# Patient Record
Sex: Female | Born: 1975 | Race: Black or African American | Hispanic: No | Marital: Married | Smoking: Former smoker
Health system: Southern US, Community
[De-identification: ages and names within clinical notes are randomized; demographics above are authoritative.]

## PROBLEM LIST (undated history)

## (undated) ENCOUNTER — Inpatient Hospital Stay (HOSPITAL_COMMUNITY): Payer: Self-pay

## (undated) DIAGNOSIS — C801 Malignant (primary) neoplasm, unspecified: Secondary | ICD-10-CM

## (undated) DIAGNOSIS — R112 Nausea with vomiting, unspecified: Secondary | ICD-10-CM

## (undated) DIAGNOSIS — N809 Endometriosis, unspecified: Secondary | ICD-10-CM

## (undated) DIAGNOSIS — F419 Anxiety disorder, unspecified: Secondary | ICD-10-CM

## (undated) DIAGNOSIS — N96 Recurrent pregnancy loss: Secondary | ICD-10-CM

## (undated) DIAGNOSIS — J45909 Unspecified asthma, uncomplicated: Secondary | ICD-10-CM

## (undated) DIAGNOSIS — Z9889 Other specified postprocedural states: Secondary | ICD-10-CM

## (undated) DIAGNOSIS — E039 Hypothyroidism, unspecified: Secondary | ICD-10-CM

## (undated) DIAGNOSIS — K219 Gastro-esophageal reflux disease without esophagitis: Secondary | ICD-10-CM

## (undated) DIAGNOSIS — R42 Dizziness and giddiness: Secondary | ICD-10-CM

## (undated) DIAGNOSIS — D219 Benign neoplasm of connective and other soft tissue, unspecified: Secondary | ICD-10-CM

## (undated) DIAGNOSIS — R51 Headache: Secondary | ICD-10-CM

## (undated) DIAGNOSIS — M1990 Unspecified osteoarthritis, unspecified site: Secondary | ICD-10-CM

## (undated) DIAGNOSIS — G4700 Insomnia, unspecified: Secondary | ICD-10-CM

## (undated) DIAGNOSIS — E559 Vitamin D deficiency, unspecified: Secondary | ICD-10-CM

## (undated) DIAGNOSIS — R002 Palpitations: Secondary | ICD-10-CM

## (undated) DIAGNOSIS — R569 Unspecified convulsions: Secondary | ICD-10-CM

## (undated) DIAGNOSIS — E282 Polycystic ovarian syndrome: Secondary | ICD-10-CM

## (undated) HISTORY — PX: DILATION AND CURETTAGE OF UTERUS: SHX78

## (undated) HISTORY — PX: REDUCTION MAMMAPLASTY: SUR839

## (undated) HISTORY — DX: Insomnia, unspecified: G47.00

## (undated) HISTORY — DX: Vitamin D deficiency, unspecified: E55.9

## (undated) HISTORY — DX: Unspecified asthma, uncomplicated: J45.909

## (undated) HISTORY — DX: Malignant (primary) neoplasm, unspecified: C80.1

## (undated) HISTORY — DX: Recurrent pregnancy loss: N96

## (undated) HISTORY — DX: Polycystic ovarian syndrome: E28.2

## (undated) HISTORY — PX: BREAST REDUCTION SURGERY: SHX8

## (undated) HISTORY — PX: UPPER GI ENDOSCOPY: SHX6162

## (undated) HISTORY — DX: Dizziness and giddiness: R42

## (undated) HISTORY — PX: COLONOSCOPY: SHX174

## (undated) HISTORY — PX: OTHER SURGICAL HISTORY: SHX169

---
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
## 1997-12-12 HISTORY — PX: GANGLION CYST EXCISION: SHX1691

## 1998-04-28 ENCOUNTER — Other Ambulatory Visit: Admission: RE | Admit: 1998-04-28 | Discharge: 1998-04-28 | Payer: Self-pay | Admitting: Gynecology

## 1998-06-08 ENCOUNTER — Other Ambulatory Visit: Admission: RE | Admit: 1998-06-08 | Discharge: 1998-06-08 | Payer: Self-pay | Admitting: Obstetrics and Gynecology

## 1998-06-29 ENCOUNTER — Other Ambulatory Visit: Admission: RE | Admit: 1998-06-29 | Discharge: 1998-06-29 | Payer: Self-pay | Admitting: Obstetrics and Gynecology

## 1998-09-21 ENCOUNTER — Inpatient Hospital Stay (HOSPITAL_COMMUNITY): Admission: AD | Admit: 1998-09-21 | Discharge: 1998-09-21 | Payer: Self-pay | Admitting: Obstetrics & Gynecology

## 1998-11-09 ENCOUNTER — Inpatient Hospital Stay (HOSPITAL_COMMUNITY): Admission: AD | Admit: 1998-11-09 | Discharge: 1998-11-09 | Payer: Self-pay | Admitting: Obstetrics & Gynecology

## 1999-03-12 ENCOUNTER — Inpatient Hospital Stay (HOSPITAL_COMMUNITY): Admission: AD | Admit: 1999-03-12 | Discharge: 1999-03-15 | Payer: Self-pay | Admitting: Obstetrics & Gynecology

## 1999-04-07 ENCOUNTER — Other Ambulatory Visit: Admission: RE | Admit: 1999-04-07 | Discharge: 1999-04-07 | Payer: Self-pay | Admitting: Obstetrics and Gynecology

## 1999-07-01 ENCOUNTER — Emergency Department (HOSPITAL_COMMUNITY): Admission: EM | Admit: 1999-07-01 | Discharge: 1999-07-01 | Payer: Self-pay | Admitting: Emergency Medicine

## 1999-07-01 ENCOUNTER — Encounter: Payer: Self-pay | Admitting: Emergency Medicine

## 2000-02-17 ENCOUNTER — Other Ambulatory Visit: Admission: RE | Admit: 2000-02-17 | Discharge: 2000-02-17 | Payer: Self-pay | Admitting: Obstetrics

## 2000-03-27 ENCOUNTER — Other Ambulatory Visit: Admission: RE | Admit: 2000-03-27 | Discharge: 2000-03-27 | Payer: Self-pay | Admitting: Orthopedic Surgery

## 2000-07-25 ENCOUNTER — Encounter: Payer: Self-pay | Admitting: Emergency Medicine

## 2000-07-25 ENCOUNTER — Emergency Department (HOSPITAL_COMMUNITY): Admission: EM | Admit: 2000-07-25 | Discharge: 2000-07-25 | Payer: Self-pay | Admitting: Emergency Medicine

## 2000-11-24 ENCOUNTER — Emergency Department (HOSPITAL_COMMUNITY): Admission: EM | Admit: 2000-11-24 | Discharge: 2000-11-24 | Payer: Self-pay | Admitting: Emergency Medicine

## 2000-11-24 ENCOUNTER — Encounter: Payer: Self-pay | Admitting: Emergency Medicine

## 2001-04-18 ENCOUNTER — Emergency Department (HOSPITAL_COMMUNITY): Admission: EM | Admit: 2001-04-18 | Discharge: 2001-04-18 | Payer: Self-pay | Admitting: *Deleted

## 2002-02-25 ENCOUNTER — Inpatient Hospital Stay (HOSPITAL_COMMUNITY): Admission: AD | Admit: 2002-02-25 | Discharge: 2002-02-25 | Payer: Self-pay | Admitting: *Deleted

## 2002-03-24 ENCOUNTER — Emergency Department (HOSPITAL_COMMUNITY): Admission: EM | Admit: 2002-03-24 | Discharge: 2002-03-25 | Payer: Self-pay | Admitting: Emergency Medicine

## 2002-06-04 ENCOUNTER — Other Ambulatory Visit: Admission: RE | Admit: 2002-06-04 | Discharge: 2002-06-04 | Payer: Self-pay | Admitting: Obstetrics and Gynecology

## 2003-03-10 ENCOUNTER — Emergency Department (HOSPITAL_COMMUNITY): Admission: EM | Admit: 2003-03-10 | Discharge: 2003-03-10 | Payer: Self-pay | Admitting: Emergency Medicine

## 2003-05-29 ENCOUNTER — Emergency Department (HOSPITAL_COMMUNITY): Admission: EM | Admit: 2003-05-29 | Discharge: 2003-05-30 | Payer: Self-pay | Admitting: Emergency Medicine

## 2004-10-30 ENCOUNTER — Emergency Department (HOSPITAL_COMMUNITY): Admission: EM | Admit: 2004-10-30 | Discharge: 2004-10-30 | Payer: Self-pay | Admitting: Emergency Medicine

## 2006-01-24 ENCOUNTER — Emergency Department (HOSPITAL_COMMUNITY): Admission: EM | Admit: 2006-01-24 | Discharge: 2006-01-25 | Payer: Self-pay | Admitting: Emergency Medicine

## 2006-07-15 ENCOUNTER — Emergency Department (HOSPITAL_COMMUNITY): Admission: EM | Admit: 2006-07-15 | Discharge: 2006-07-16 | Payer: Self-pay | Admitting: Emergency Medicine

## 2006-11-16 ENCOUNTER — Emergency Department (HOSPITAL_COMMUNITY): Admission: EM | Admit: 2006-11-16 | Discharge: 2006-11-17 | Payer: Self-pay | Admitting: Emergency Medicine

## 2008-11-10 ENCOUNTER — Encounter: Payer: Self-pay | Admitting: Internal Medicine

## 2009-02-04 ENCOUNTER — Emergency Department (HOSPITAL_COMMUNITY): Admission: EM | Admit: 2009-02-04 | Discharge: 2009-02-04 | Payer: Self-pay | Admitting: Family Medicine

## 2009-02-08 ENCOUNTER — Ambulatory Visit: Payer: Self-pay | Admitting: Diagnostic Radiology

## 2009-02-08 ENCOUNTER — Emergency Department (HOSPITAL_BASED_OUTPATIENT_CLINIC_OR_DEPARTMENT_OTHER): Admission: EM | Admit: 2009-02-08 | Discharge: 2009-02-08 | Payer: Self-pay | Admitting: Emergency Medicine

## 2009-02-09 LAB — CONVERTED CEMR LAB

## 2009-03-25 ENCOUNTER — Emergency Department (HOSPITAL_COMMUNITY): Admission: EM | Admit: 2009-03-25 | Discharge: 2009-03-25 | Payer: Self-pay | Admitting: Family Medicine

## 2009-09-20 ENCOUNTER — Inpatient Hospital Stay (HOSPITAL_COMMUNITY): Admission: AD | Admit: 2009-09-20 | Discharge: 2009-09-20 | Payer: Self-pay | Admitting: Obstetrics and Gynecology

## 2009-09-20 ENCOUNTER — Emergency Department (HOSPITAL_COMMUNITY): Admission: EM | Admit: 2009-09-20 | Discharge: 2009-09-20 | Payer: Self-pay | Admitting: Emergency Medicine

## 2009-11-19 ENCOUNTER — Encounter: Payer: Self-pay | Admitting: Internal Medicine

## 2009-11-24 ENCOUNTER — Encounter: Admission: RE | Admit: 2009-11-24 | Discharge: 2009-11-24 | Payer: Self-pay | Admitting: Family Medicine

## 2009-11-24 ENCOUNTER — Encounter: Payer: Self-pay | Admitting: Internal Medicine

## 2009-12-02 ENCOUNTER — Other Ambulatory Visit: Admission: RE | Admit: 2009-12-02 | Discharge: 2009-12-02 | Payer: Self-pay | Admitting: Diagnostic Radiology

## 2009-12-02 ENCOUNTER — Encounter: Payer: Self-pay | Admitting: Internal Medicine

## 2009-12-02 ENCOUNTER — Encounter: Admission: RE | Admit: 2009-12-02 | Discharge: 2009-12-02 | Payer: Self-pay | Admitting: Family Medicine

## 2010-04-29 ENCOUNTER — Emergency Department (HOSPITAL_COMMUNITY): Admission: EM | Admit: 2010-04-29 | Discharge: 2010-04-30 | Payer: Self-pay | Admitting: Emergency Medicine

## 2010-05-28 ENCOUNTER — Emergency Department (HOSPITAL_BASED_OUTPATIENT_CLINIC_OR_DEPARTMENT_OTHER): Admission: EM | Admit: 2010-05-28 | Discharge: 2010-05-29 | Payer: Self-pay | Admitting: Emergency Medicine

## 2010-05-28 ENCOUNTER — Ambulatory Visit: Payer: Self-pay | Admitting: Radiology

## 2010-05-29 ENCOUNTER — Ambulatory Visit: Payer: Self-pay | Admitting: Diagnostic Radiology

## 2010-07-20 ENCOUNTER — Ambulatory Visit: Payer: Self-pay | Admitting: Internal Medicine

## 2010-07-20 DIAGNOSIS — Z8669 Personal history of other diseases of the nervous system and sense organs: Secondary | ICD-10-CM

## 2010-07-20 DIAGNOSIS — E049 Nontoxic goiter, unspecified: Secondary | ICD-10-CM | POA: Insufficient documentation

## 2010-07-20 DIAGNOSIS — Z9189 Other specified personal risk factors, not elsewhere classified: Secondary | ICD-10-CM | POA: Insufficient documentation

## 2010-07-20 DIAGNOSIS — Z87898 Personal history of other specified conditions: Secondary | ICD-10-CM | POA: Insufficient documentation

## 2010-07-20 DIAGNOSIS — G43909 Migraine, unspecified, not intractable, without status migrainosus: Secondary | ICD-10-CM | POA: Insufficient documentation

## 2010-07-21 LAB — CONVERTED CEMR LAB: TSH: 1.44 microintl units/mL (ref 0.35–5.50)

## 2010-07-28 ENCOUNTER — Encounter: Payer: Self-pay | Admitting: Internal Medicine

## 2010-07-28 ENCOUNTER — Telehealth: Payer: Self-pay | Admitting: Internal Medicine

## 2010-08-06 ENCOUNTER — Telehealth (INDEPENDENT_AMBULATORY_CARE_PROVIDER_SITE_OTHER): Payer: Self-pay | Admitting: *Deleted

## 2010-08-09 ENCOUNTER — Encounter: Payer: Self-pay | Admitting: Internal Medicine

## 2011-01-02 ENCOUNTER — Encounter: Payer: Self-pay | Admitting: Internal Medicine

## 2011-01-09 ENCOUNTER — Emergency Department (HOSPITAL_COMMUNITY)
Admission: EM | Admit: 2011-01-09 | Discharge: 2011-01-09 | Payer: Self-pay | Source: Home / Self Care | Admitting: Emergency Medicine

## 2011-01-11 NOTE — Progress Notes (Signed)
  Phone Note Other Incoming   Request: Send information Summary of Call: Records received from [facility] @ [facility]. 12 pages forwarded to Dr. Felicity Coyer for review.

## 2011-01-11 NOTE — Letter (Signed)
Summary: Mila Palmer MD/[facility]  Mila Palmer MD/[facility]   Imported By: Lester Loudon 09/02/2010 07:48:09  _____________________________________________________________________  External Attachment:    Type:   Image     Comment:   External Document

## 2011-01-11 NOTE — Letter (Signed)
Summary: Deatra James MD/[facility]  Deatra James MD/[facility]   Imported By: Lester Inland 09/02/2010 07:57:46  _____________________________________________________________________  External Attachment:    Type:   Image     Comment:   External Document

## 2011-01-11 NOTE — Progress Notes (Signed)
Summary: U/S Thyroid  Phone Note Call from Patient Call back at Home Phone [phone]   Caller: Patient Summary of Call: Pt called to check the status of thyroid U/S referral. There is no U/U order in EMR. Please advise Initial call taken by: Margaret Pyle, CMA,  July 28, 2010 11:42 AM  Follow-up for Phone Call        will order now since prior order not seen - [facility] will notify when arranged - Follow-up by: Newt Lukes MD,  July 28, 2010 11:59 AM  Additional Follow-up for Phone Call Additional follow up Details #1::        Pt informed that [facility] have sent referral information to [facility] and she will be contacted by them directly with appt info Additional Follow-up by: Margaret Pyle, CMA,  July 28, 2010 1:16 PM

## 2011-01-11 NOTE — Assessment & Plan Note (Signed)
Summary: NEW/ NO INSU/ ASKED TO BRING $184.00/NWS   Vital Signs:  Patient profile:   35 year old female Height:      65.5 inches (166.37 cm) Weight:      242.12 pounds (110.05 kg) BMI:     39.82 O2 Sat:      98 % on Room air Temp:     98.7 degrees F (37.06 degrees C) oral Pulse rate:   73 / minute BP sitting:   112 / 82  (left arm) Cuff size:   large  Vitals Entered By: Orlan Leavens RMA (July 20, 2010 2:02 PM)  O2 Flow:  Room air CC: New patient Is Patient Diabetic? No Pain Assessment Patient in pain? no        Primary Care Provider:  Newt Lukes MD  CC:  New patient.  History of Present Illness: new pt to me and our practice, here to est care -  c/o goiter - long hx same, present >12 months - notes increasing size since last year - prior US and bx done 11/2009 - "no problems" now with increasing fatigue, cough and choke with swallow, esp at bedtime while lying down for bed - no voice change but +inc neck size - not a/w neck tenderness or pain -  no fever or weight change (unable to lose weight despite diet/exercise efforts)  Preventive Screening-Counseling & Management  Alcohol-Tobacco     Alcohol drinks/day: 0     Alcohol Counseling: not indicated; patient does not drink     Smoking Status: quit     Year Quit: 1996     Tobacco Counseling: not indicated; no tobacco use  Caffeine-Diet-Exercise     Does Patient Exercise: yes     Type of exercise: video exercise tape     Exercise Counseling: to improve exercise regimen     Depression Counseling: not indicated; screening negative for depression  Safety-Violence-Falls     Seat Belt Counseling: not indicated; patient wears seat belts     Helmet Counseling: not indicated; patient wears helmet when riding bicycle/motocycle     Firearms in the Home: no firearms in the home     Firearm Counseling: not applicable     Violence in the Home: no risk noted     Fall Risk Counseling: not indicated; no  significant falls noted  Clinical Review Panels:  Prevention   Last Pap Smear:  Interpretation Result:Negative for intraepithelial Lesion or Malignancy.    (02/09/2009)   Current Medications (verified): 1)  None  Allergies (verified): 1)  ! Codeine  Past History:  Past Medical History: goiter  obesity seziure d/o - declines med tx for same  MD roster: gyn - michele bigelman ([facility])  Past Surgical History: Cyst removed from wrist in 1999 breast reduction in 1997  Family History: Family History of Alcoholism/Addiction Family History of Arthritis Family History Diabetes 1st degree relative Family History Hypertension  Social History: Former Smoker - quit after HS no alcohol use single but dating, lives with dtr employed as Associate Professor at [employer] Smoking Status:  quit Does Patient Exercise:  yes  Review of Systems  The patient denies fever, weight gain, hoarseness, chest pain, syncope, dyspnea on exertion, peripheral edema, headaches, abdominal pain, severe indigestion/heartburn, muscle weakness, depression, and abnormal bleeding.    Physical Exam  General:  overweight-appearing.  alert, well-developed, well-nourished, and cooperative to examination.   dtr at side Eyes:  vision grossly intact; pupils equal, round and reactive to light.  conjunctiva and lids normal.   no proptosis Neck:  symmetric enlarged goiter, nontender - supple, full ROM. no JVD or carotid bruits.   Lungs:  normal respiratory effort, no intercostal retractions or use of accessory muscles; normal breath sounds bilaterally - no crackles and no wheezes.    Heart:  normal rate, regular rhythm, no murmur, and no rub. BLE without edema. Neurologic:  alert & oriented X3 and cranial nerves II-XII symetrically intact.  strength normal in all extremities, sensation intact to light touch, and gait normal. speech fluent without dysarthria or aphasia; follows commands with good comprehension.  Psych:   Oriented X3, memory intact for recent and remote, normally interactive, good eye contact, not anxious appearing, not depressed appearing, and not agitated.      Impression & Recommendations:  Problem # 1:  GOITER, UNSPECIFIED (ICD-240.9) prior US and bx results in [facility] EMR system reviewed - no cancer however inc size with inc symptoms (choke, swallow trouble) recheck [name] now and repeat lab tests (also send for prior records of lab values from [facility] to review) consider I131 ablation tx to reduce size if TSH low/normal due to neck (mass effect) symptoms -   Orders: TLB-TSH (Thyroid Stimulating Hormone) (84443-TSH) TLB-T4 (Thyrox), Free [phone]) TLB-T3, Free (Triiodothyronine) (84481-T3FREE)  Patient Instructions: 1)  it was good to see you today. 2)  will send for records from [place] re: prior evaluation of your thyroid nodules 3)  test(s) ordered today: thyroid labs and refer for repeat ultrasound to look at size of thyroid - your results will be posted on the phone tree for review in 48-72 hours from the time of test completion; call [phone] and enter your 9 digit MRN (listed above on this page, just below your name); if any changes need to be made or there are abnormal results, you will be contacted directly.  4)  further testing and plans for treatment will be determined after reviewing this information - consider referral for ablation tx due to large size if labs are normal and US shows increasing size - or may need endo input for mgmt 5)  Please schedule a follow-up appointment as needed.   Pap Smear  Procedure date:  02/09/2009  Findings:      Interpretation Result:Negative for intraepithelial Lesion or Malignancy.     MISC. Report  Procedure date:  11/24/2009  Findings:      Type of Report: Thyroid Ultrasound Impression: Multinodular goiter with largest solid nodules in the mid right lobe measuring 1.7cm and in the interior left lobe measuring 1.8 cm. consider  ultrasound guided fine needle aspiration.   MISC. Report  Procedure date:  12/02/2009  Findings:      Type of Report: Cytology report  Thyroid fine needle aspiration, left findings consistent with a non-neoplastic goiter  Fine needle aspiration, right findings consistent with non-neoplastic goiter

## 2011-01-12 ENCOUNTER — Emergency Department (HOSPITAL_COMMUNITY)
Admission: EM | Admit: 2011-01-12 | Discharge: 2011-01-12 | Disposition: A | Discharge status: Home / Self Care | Payer: Self-pay | Attending: Emergency Medicine | Admitting: Emergency Medicine

## 2011-01-12 DIAGNOSIS — S8990XA Unspecified injury of unspecified lower leg, initial encounter: Secondary | ICD-10-CM | POA: Insufficient documentation

## 2011-01-12 DIAGNOSIS — S99919A Unspecified injury of unspecified ankle, initial encounter: Secondary | ICD-10-CM | POA: Insufficient documentation

## 2011-01-12 DIAGNOSIS — M25569 Pain in unspecified knee: Secondary | ICD-10-CM | POA: Insufficient documentation

## 2011-01-12 DIAGNOSIS — W19XXXA Unspecified fall, initial encounter: Secondary | ICD-10-CM | POA: Insufficient documentation

## 2011-01-12 DIAGNOSIS — G40909 Epilepsy, unspecified, not intractable, without status epilepticus: Secondary | ICD-10-CM | POA: Insufficient documentation

## 2011-01-13 ENCOUNTER — Other Ambulatory Visit (HOSPITAL_COMMUNITY): Payer: Self-pay | Admitting: Orthopedic Surgery

## 2011-01-13 DIAGNOSIS — R52 Pain, unspecified: Secondary | ICD-10-CM

## 2011-01-24 ENCOUNTER — Ambulatory Visit (HOSPITAL_COMMUNITY)
Admission: RE | Admit: 2011-01-24 | Discharge: 2011-01-24 | Disposition: A | Discharge status: Home / Self Care | Payer: Medicaid Other | Source: Ambulatory Visit | Attending: Orthopedic Surgery | Admitting: Orthopedic Surgery

## 2011-01-24 DIAGNOSIS — M25569 Pain in unspecified knee: Secondary | ICD-10-CM | POA: Insufficient documentation

## 2011-01-24 DIAGNOSIS — Z9181 History of falling: Secondary | ICD-10-CM | POA: Insufficient documentation

## 2011-01-24 DIAGNOSIS — R52 Pain, unspecified: Secondary | ICD-10-CM

## 2011-01-24 DIAGNOSIS — R609 Edema, unspecified: Secondary | ICD-10-CM | POA: Insufficient documentation

## 2011-03-02 ENCOUNTER — Other Ambulatory Visit: Payer: Self-pay | Admitting: Family Medicine

## 2011-03-02 DIAGNOSIS — E042 Nontoxic multinodular goiter: Secondary | ICD-10-CM

## 2011-03-07 ENCOUNTER — Ambulatory Visit
Admission: RE | Admit: 2011-03-07 | Discharge: 2011-03-07 | Disposition: A | Discharge status: Home / Self Care | Payer: Medicaid Other | Source: Ambulatory Visit | Attending: Family Medicine | Admitting: Family Medicine

## 2011-03-07 DIAGNOSIS — E042 Nontoxic multinodular goiter: Secondary | ICD-10-CM

## 2011-03-17 LAB — COMPREHENSIVE METABOLIC PANEL
ALT: 21 U/L (ref 0–35)
Alkaline Phosphatase: 63 U/L (ref 39–117)
BUN: 8 mg/dL (ref 6–23)
CO2: 30 mEq/L (ref 19–32)
Chloride: 103 mEq/L (ref 96–112)
GFR calc non Af Amer: >60 mL/min (ref >60)
Glucose, Bld: 72 mg/dL (ref 70–99)
Potassium: 3.7 mEq/L (ref 3.5–5.1)
Sodium: 138 mEq/L (ref 135–145)
Total Bilirubin: 0.8 mg/dL (ref 0.3–1.2)
Total Protein: 7.1 g/dL (ref 6.0–8.3)

## 2011-03-17 LAB — HCG, QUANTITATIVE, PREGNANCY: hCG, Beta Chain, Quant, S: <2 m[IU]/mL (ref <5)

## 2011-03-17 LAB — CBC
HCT: 38.4 % (ref 36.0–46.0)
HCT: 38.5 % (ref 36.0–46.0)
Hemoglobin: 12.7 g/dL (ref 12.0–15.0)
Hemoglobin: 12.9 g/dL (ref 12.0–15.0)
MCV: 89.4 fL (ref 78.0–100.0)
Platelets: 206 10*3/uL (ref 150–400)
RBC: 4.29 MIL/uL (ref 3.87–5.11)
RBC: 4.31 MIL/uL (ref 3.87–5.11)
RDW: 14.2 % (ref 11.5–15.5)
WBC: 6.8 10*3/uL (ref 4.0–10.5)
WBC: 7.7 10*3/uL (ref 4.0–10.5)

## 2011-03-17 LAB — DIFFERENTIAL
Basophils Absolute: 0 10*3/uL (ref 0.0–0.1)
Basophils Relative: 0 % (ref 0–1)
Eosinophils Absolute: 0 10*3/uL (ref 0.0–0.7)
Monocytes Relative: 7 % (ref 3–12)
Neutro Abs: 5.5 10*3/uL (ref 1.7–7.7)
Neutrophils Relative %: 71 % (ref 43–77)

## 2011-03-17 LAB — POCT CARDIAC MARKERS
Myoglobin, poc: 94.1 ng/mL (ref 12–200)
Troponin i, poc: <0.05 ng/mL (ref 0.00–0.09)

## 2011-03-17 LAB — POCT PREGNANCY, URINE: Preg Test, Ur: NEGATIVE

## 2011-08-07 ENCOUNTER — Emergency Department (HOSPITAL_COMMUNITY)
Admission: EM | Admit: 2011-08-07 | Discharge: 2011-08-07 | Disposition: A | Discharge status: Home / Self Care | Payer: Self-pay | Attending: Emergency Medicine | Admitting: Emergency Medicine

## 2011-08-07 DIAGNOSIS — M79609 Pain in unspecified limb: Secondary | ICD-10-CM | POA: Insufficient documentation

## 2011-08-07 DIAGNOSIS — M25529 Pain in unspecified elbow: Secondary | ICD-10-CM | POA: Insufficient documentation

## 2012-02-28 ENCOUNTER — Emergency Department (HOSPITAL_BASED_OUTPATIENT_CLINIC_OR_DEPARTMENT_OTHER)
Admission: EM | Admit: 2012-02-28 | Discharge: 2012-02-28 | Disposition: A | Discharge status: Home / Self Care | Payer: Self-pay | Attending: Emergency Medicine | Admitting: Emergency Medicine

## 2012-02-28 ENCOUNTER — Emergency Department (INDEPENDENT_AMBULATORY_CARE_PROVIDER_SITE_OTHER): Payer: Self-pay

## 2012-02-28 ENCOUNTER — Encounter (HOSPITAL_BASED_OUTPATIENT_CLINIC_OR_DEPARTMENT_OTHER): Payer: Self-pay | Admitting: Emergency Medicine

## 2012-02-28 DIAGNOSIS — R110 Nausea: Secondary | ICD-10-CM | POA: Insufficient documentation

## 2012-02-28 DIAGNOSIS — K5900 Constipation, unspecified: Secondary | ICD-10-CM | POA: Insufficient documentation

## 2012-02-28 DIAGNOSIS — R109 Unspecified abdominal pain: Secondary | ICD-10-CM

## 2012-02-28 DIAGNOSIS — R42 Dizziness and giddiness: Secondary | ICD-10-CM | POA: Insufficient documentation

## 2012-02-28 DIAGNOSIS — R1010 Upper abdominal pain, unspecified: Secondary | ICD-10-CM

## 2012-02-28 LAB — DIFFERENTIAL
Basophils Relative: 0 % (ref 0–1)
Eosinophils Absolute: 0 10*3/uL (ref 0.0–0.7)
Eosinophils Relative: 1 % (ref 0–5)
Monocytes Absolute: 0.5 10*3/uL (ref 0.1–1.0)
Monocytes Relative: 7 % (ref 3–12)

## 2012-02-28 LAB — URINALYSIS, ROUTINE W REFLEX MICROSCOPIC
Ketones, ur: NEGATIVE mg/dL
Leukocytes, UA: NEGATIVE
Nitrite: NEGATIVE
Protein, ur: NEGATIVE mg/dL
pH: 6 (ref 5.0–8.0)

## 2012-02-28 LAB — COMPREHENSIVE METABOLIC PANEL
Albumin: 3.7 g/dL (ref 3.5–5.2)
BUN: 11 mg/dL (ref 6–23)
Calcium: 9.6 mg/dL (ref 8.4–10.5)
Creatinine, Ser: 0.8 mg/dL (ref 0.50–1.10)
Total Bilirubin: 0.2 mg/dL — ABNORMAL LOW (ref 0.3–1.2)
Total Protein: 7.7 g/dL (ref 6.0–8.3)

## 2012-02-28 LAB — URINE MICROSCOPIC-ADD ON

## 2012-02-28 LAB — CBC
HCT: 39.3 % (ref 36.0–46.0)
Hemoglobin: 13.1 g/dL (ref 12.0–15.0)
MCH: 28.5 pg (ref 26.0–34.0)
MCHC: 33.3 g/dL (ref 30.0–36.0)
RDW: 13.2 % (ref 11.5–15.5)

## 2012-02-28 LAB — LIPASE, BLOOD: Lipase: 40 U/L (ref 11–59)

## 2012-02-28 MED ORDER — SODIUM CHLORIDE 0.9 % IV BOLUS (SEPSIS)
1000.0000 mL | Freq: Once | INTRAVENOUS | Status: AC
  Administered 2012-02-28: 1000 mL via INTRAVENOUS

## 2012-02-28 MED ORDER — IOHEXOL 300 MG/ML  SOLN
100.0000 mL | Freq: Once | INTRAMUSCULAR | Status: AC | PRN
  Administered 2012-02-28: 100 mL via INTRAVENOUS

## 2012-02-28 MED ORDER — FAMOTIDINE IN NACL 20-0.9 MG/50ML-% IV SOLN
20.0000 mg | Freq: Once | INTRAVENOUS | Status: AC
  Administered 2012-02-28: 20 mg via INTRAVENOUS
  Filled 2012-02-28 (×2): qty 50

## 2012-02-28 MED ORDER — GI COCKTAIL ~~LOC~~
ORAL | Status: AC
  Filled 2012-02-28: qty 30

## 2012-02-28 MED ORDER — IOHEXOL 300 MG/ML  SOLN
20.0000 mL | INTRAMUSCULAR | Status: AC
  Administered 2012-02-28: 20 mL via ORAL

## 2012-02-28 MED ORDER — GI COCKTAIL ~~LOC~~
30.0000 mL | Freq: Once | ORAL | Status: AC
  Administered 2012-02-28: 30 mL via ORAL
  Filled 2012-02-28: qty 30

## 2012-02-28 MED ORDER — FAMOTIDINE 20 MG PO TABS: 20.0000 mg | ORAL_TABLET | Freq: Two times a day (BID) | ORAL | Status: DC

## 2012-02-28 NOTE — Discharge Instructions (Signed)
Return to [facility] tomorrow for ultrasound of your gallbladder.  Abdominal Pain Abdominal pain can be caused by many things. Your caregiver decides the seriousness of your pain by an examination and possibly blood tests and X-rays. Many cases can be observed and treated at home. Most abdominal pain is not caused by a disease and will probably improve without treatment. However, in many cases, more time must pass before a clear cause of the pain can be found. Before that point, it may not be known if you need more testing, or if hospitalization or surgery is needed. HOME CARE INSTRUCTIONS   Do not take laxatives unless directed by your caregiver.   Take pain medicine only as directed by your caregiver.   Only take over-the-counter or prescription medicines for pain, discomfort, or fever as directed by your caregiver.   Try a clear liquid diet (broth, tea, or water) for as long as directed by your caregiver. Slowly move to a bland diet as tolerated.  SEEK IMMEDIATE MEDICAL CARE IF:   The pain does not go away.   You have a fever.   You keep throwing up (vomiting).   The pain is felt only in portions of the abdomen. Pain in the right side could possibly be appendicitis. In an adult, pain in the left lower portion of the abdomen could be colitis or diverticulitis.   You pass bloody or black tarry stools.  MAKE SURE YOU:   Understand these instructions.   Will watch your condition.   Will get help right away if you are not doing well or get worse.  Document Released: 09/07/2005 Document Revised: 11/17/2011 Document Reviewed: 07/16/2008 [facility] Patient Information 2012 [place], [place].

## 2012-02-28 NOTE — ED Notes (Signed)
Pt c/o middle abd pain ("tightness") x 5 mos; also reports "dizzy spells", onset 2 wks ago; sts she feels like she needs to vomit and have a BM, but can't do either

## 2012-02-28 NOTE — ED Notes (Signed)
MD at bedside. 

## 2012-02-28 NOTE — ED Provider Notes (Signed)
History     CSN: [id number]  Arrival date & time 02/28/12  1803   First MD Initiated Contact with Patient 02/28/12 1942      Chief Complaint  Patient presents with  . Abdominal Pain    (Consider location/radiation/quality/duration/timing/severity/associated sxs/prior treatment) HPI  35yoF previously healthy pw abdominal pain. Intermittent tightness x 5 months, worse after eating. C/O nausea without vomiting. No diarrhea. Has been more constipated than usual x 2 weeks, last BM yesterday. Feels that she has to vomit and/or have BM during these episodes and can do neither. Denies fever/chills/cp/sob. Denies rash. No sick contacts. Denies hematuria/dysuria/freq/urgency  ED Notes, ED Provider Notes from 02/28/12 0000 to 02/28/12 19:05:53       Adrienne [employer], RN 02/28/2012 18:23      Pt c/o middle abd pain ("tightness") x 5 mos; also reports "dizzy spells", onset 2 wks ago; sts she feels like she needs to vomit and have a BM, but can't do either    History reviewed. No pertinent past medical history.  Past Surgical History  Procedure Date  . Breast reduction surgery   . Ganglion cyst excision     No family history on file.  History  Substance Use Topics  . Smoking status: Never Smoker   . Smokeless tobacco: Not on file  . Alcohol Use: No    OB History    Grav Para Term Preterm Abortions TAB SAB Ect Mult Living                  Review of Systems  All other systems reviewed and are negative.  except as noted HPI   Allergies  Cocoa butter and Codeine  Home Medications   Current Outpatient Rx  Name Route Sig Dispense Refill  . IBUPROFEN 200 MG PO TABS Oral Take 800 mg by mouth 2 (two) times daily as needed. For pain    . LEVONORGESTREL 20 MCG/24HR IU IUD Intrauterine 1 each by Intrauterine route once. Inserted March 2010    . FAMOTIDINE 20 MG PO TABS Oral Take 1 tablet (20 mg total) by mouth 2 (two) times daily. 30 tablet 0    BP 147/86  Pulse 90   Temp(Src) 98.1 F (36.7 C) (Oral)  Resp 19  SpO2 100%  Physical Exam  Nursing note and vitals reviewed. Constitutional: She is oriented to person, place, and time. She appears well-developed.  HENT:  Head: Atraumatic.  Mouth/Throat: Oropharynx is clear and moist.  Eyes: Conjunctivae and EOM are normal. Pupils are equal, round, and reactive to light.  Neck: Normal range of motion. Neck supple.  Cardiovascular: Normal rate, regular rhythm, normal heart sounds and intact distal pulses.   Pulmonary/Chest: Effort normal and breath sounds normal. No respiratory distress. She has no wheezes. She has no rales.  Abdominal: Soft. She exhibits no distension. There is tenderness. There is no rebound and no guarding.       Min epigastric, suprapubic ttp  Musculoskeletal: Normal range of motion.  Neurological: She is alert and oriented to person, place, and time.  Skin: Skin is warm and dry. No rash noted.  Psychiatric: She has a normal mood and affect.    ED Course  Procedures (including critical care time)  Labs Reviewed  URINALYSIS, ROUTINE W REFLEX MICROSCOPIC - Abnormal; Notable for the following:    Hgb urine dipstick SMALL (*)    All other components within normal limits  COMPREHENSIVE METABOLIC PANEL - Abnormal; Notable for the following:    Total  Bilirubin 0.2 (*)    All other components within normal limits  URINE MICROSCOPIC-ADD ON  PREGNANCY, URINE  CBC  DIFFERENTIAL  LIPASE, BLOOD   Ct Abdomen Pelvis W Contrast  02/28/2012  *RADIOLOGY REPORT*  Clinical Data: Abdominal pain  CT ABDOMEN AND PELVIS WITH CONTRAST  Technique:  Multidetector CT imaging of the abdomen and pelvis was performed following the standard protocol during bolus administration of intravenous contrast.  Contrast: 1 OMNIPAQUE IOHEXOL 300 MG/ML IJ SOLN, OMNIPAQUE IOHEXOL 300 MG/ML IJ SOLN  Comparison: None.  Findings:  Visualized lung bases are clear.  Liver is normal.  Spleen is normal.  Pancreas is  normal.  The adrenal glands are normal.   The kidneys are normal.  Gallbladder is unremarkable by CT.  No biliary ductal dilation.  Stomach and visualized large and small bowel are unremarkable.  Abdominal aorta normal is in caliber.  No significant lymphadenopathy.  No free fluid or abnormal fluid collections.  Appendix identified and normal.  Visualized colon and small bowel are unremarkable.  No free fluid or abnormal fluid collections.  No significant lymphadenopathy.  Urinary bladder is normal.  There is an IUD within the uterine cavity.  IMPRESSION:  1.  No acute findings identified within the abdomen or pelvis. 2.  The appendix is noted and appears normal.  Original Report Authenticated By: Rosealee Albee, M.D.    1. Abdominal pain     MDM  35yoF pw abdominal discomfort, intermittent x 5 months after eating. Feeling some relief after gi cocktail and pepcid. CT A/P unremarkable. Negative murphy and no RUQ pain on repeat abd exam. Labs unremarkable. Plan to discharge home with pepcid. F/U tomorrow for [name] of RUQ. Low suspicion for acute cholecystitis. Given precautions for return.        Forbes Cellar, MD 02/28/12 [phone]

## 2012-02-29 ENCOUNTER — Ambulatory Visit (HOSPITAL_BASED_OUTPATIENT_CLINIC_OR_DEPARTMENT_OTHER)
Admission: RE | Admit: 2012-02-29 | Discharge: 2012-02-29 | Disposition: A | Discharge status: Home / Self Care | Payer: Self-pay | Source: Ambulatory Visit | Attending: Emergency Medicine | Admitting: Emergency Medicine

## 2012-02-29 DIAGNOSIS — R1010 Upper abdominal pain, unspecified: Secondary | ICD-10-CM

## 2012-02-29 DIAGNOSIS — R1013 Epigastric pain: Secondary | ICD-10-CM | POA: Insufficient documentation

## 2012-02-29 NOTE — ED Notes (Signed)
Ultrasound report reviewed by Dr. Radford Pax and report called to patient.  Reported ultrasound as a negative test and to follow up with her PCP Dr. Rene Paci if she continues to have problems.  Patient verbalized understanding.

## 2012-04-13 ENCOUNTER — Emergency Department (HOSPITAL_COMMUNITY): Payer: Self-pay

## 2012-04-13 ENCOUNTER — Encounter (HOSPITAL_COMMUNITY): Payer: Self-pay | Admitting: Emergency Medicine

## 2012-04-13 ENCOUNTER — Emergency Department (HOSPITAL_COMMUNITY)
Admission: EM | Admit: 2012-04-13 | Discharge: 2012-04-13 | Disposition: A | Discharge status: Home / Self Care | Payer: Self-pay | Attending: Emergency Medicine | Admitting: Emergency Medicine

## 2012-04-13 DIAGNOSIS — R509 Fever, unspecified: Secondary | ICD-10-CM | POA: Insufficient documentation

## 2012-04-13 DIAGNOSIS — R059 Cough, unspecified: Secondary | ICD-10-CM | POA: Insufficient documentation

## 2012-04-13 DIAGNOSIS — J040 Acute laryngitis: Secondary | ICD-10-CM

## 2012-04-13 DIAGNOSIS — R61 Generalized hyperhidrosis: Secondary | ICD-10-CM | POA: Insufficient documentation

## 2012-04-13 DIAGNOSIS — J069 Acute upper respiratory infection, unspecified: Secondary | ICD-10-CM

## 2012-04-13 DIAGNOSIS — R05 Cough: Secondary | ICD-10-CM | POA: Insufficient documentation

## 2012-04-13 DIAGNOSIS — R569 Unspecified convulsions: Secondary | ICD-10-CM | POA: Insufficient documentation

## 2012-04-13 DIAGNOSIS — H9209 Otalgia, unspecified ear: Secondary | ICD-10-CM | POA: Insufficient documentation

## 2012-04-13 HISTORY — DX: Unspecified convulsions: R56.9

## 2012-04-13 MED ORDER — PREDNISONE 50 MG PO TABS: 50.0000 mg | ORAL_TABLET | Freq: Every day | ORAL | Status: AC

## 2012-04-13 MED ORDER — PROMETHAZINE-DM 6.25-15 MG/5ML PO SYRP: 5.0000 mL | ORAL_SOLUTION | Freq: Four times a day (QID) | ORAL | Status: AC | PRN

## 2012-04-13 MED ORDER — GUAIFENESIN ER 1200 MG PO TB12: 1.0000 | ORAL_TABLET | Freq: Two times a day (BID) | ORAL | Status: DC

## 2012-04-13 NOTE — Discharge Instructions (Signed)
Your x-rays did not show any abnormalities. Increase your fluids. Return here as needed. Follow up with your primary care doctor as well.

## 2012-04-13 NOTE — ED Provider Notes (Signed)
History     CSN: [id number]  Arrival date & time 04/13/12  1644   First MD Initiated Contact with Patient 04/13/12 1838      Chief Complaint  Patient presents with  . Cough    (Consider location/radiation/quality/duration/timing/severity/associated sxs/prior treatment) HPI Patient presents to emergency department for cough that started on Tuesday.  She states, that she has also had fever, chills, sweats.  Patient states, that she has also had loss of her voice.  States, that she has also had some mild pain in both ears.  She denies chest pain, shortness of breath, weakness, vomiting, sore throat diarrhea, abdominal pain, dizziness, or headache.  Patient, states she has not tried any medications to try and alleviate her symptoms. She states that nothing seems to make her symptoms better or worse. Past Medical History  Diagnosis Date  . Seizures     Past Surgical History  Procedure Date  . Breast reduction surgery   . Ganglion cyst excision     No family history on file.  History  Substance Use Topics  . Smoking status: Never Smoker   . Smokeless tobacco: Not on file  . Alcohol Use: No    OB History    Grav Para Term Preterm Abortions TAB SAB Ect Mult Living                  Review of Systems All other systems negative except as documented in the HPI. All pertinent positives and negatives as reviewed in the HPI.  Allergies  Cocoa butter and Codeine  Home Medications   Current Outpatient Rx  Name Route Sig Dispense Refill  . LEVONORGESTREL 20 MCG/24HR IU IUD Intrauterine 1 each by Intrauterine route once. Inserted March 2010      BP 113/59  Pulse 90  Temp(Src) 100 F (37.8 C) (Oral)  Resp 18  SpO2 98%  Physical Exam Physical Examination: General appearance - alert, well appearing, and in no distress, oriented to person, place, and time and normal appearing weight Mental status - alert, oriented to person, place, and time, normal mood, behavior, speech,  dress, motor activity, and thought processes Eyes - pupils equal and reactive, extraocular eye movements intact Ears - bilateral TM's and external ear canals normal Nose - normal and patent, no erythema, discharge or polyps Mouth - mucous membranes moist, pharynx normal without lesions Neck - bilateral symmetric anterior adenopathy Chest - clear to auscultation, no wheezes, rales or rhonchi, symmetric air entry, no tachypnea, retractions or cyanosis Heart - normal rate, regular rhythm, normal S1, S2, no murmurs, rubs, clicks or gallops Skin - normal coloration and turgor, no rashes, no suspicious skin lesions noted  ED Course  Procedures (including critical care time)  Patient has been awaiting x-ray for the last hour.  As soon these results are back we can make a better assessment for the patient. She will be treated for viral URI and advised to return here as needed.  Told to followup with her primary care doctor for a recheck.  She is to increase her fluid intake.  MDM   MDM Reviewed: nursing note and vitals Interpretation: x-ray          Carlyle Dolly, PA-C 04/14/12 0034

## 2012-04-13 NOTE — ED Notes (Signed)
Pt. Started with cough on Tues., she has also had fever, chill, and sweats.  Her face is swollen and her ears are ringing.  Pt. Has had nausea but no vomiting.

## 2012-04-14 NOTE — ED Provider Notes (Signed)
Medical screening examination/treatment/procedure(s) were performed by non-physician practitioner and as supervising physician I was immediately available for consultation/collaboration.  Raeford Razor, MD 04/14/12 2015

## 2012-09-01 ENCOUNTER — Emergency Department (HOSPITAL_COMMUNITY)
Admission: EM | Admit: 2012-09-01 | Discharge: 2012-09-01 | Disposition: A | Discharge status: Home / Self Care | Payer: Self-pay | Attending: Emergency Medicine | Admitting: Emergency Medicine

## 2012-09-01 ENCOUNTER — Encounter (HOSPITAL_COMMUNITY): Payer: Self-pay | Admitting: Emergency Medicine

## 2012-09-01 DIAGNOSIS — N760 Acute vaginitis: Secondary | ICD-10-CM | POA: Insufficient documentation

## 2012-09-01 DIAGNOSIS — B9689 Other specified bacterial agents as the cause of diseases classified elsewhere: Secondary | ICD-10-CM | POA: Insufficient documentation

## 2012-09-01 DIAGNOSIS — A499 Bacterial infection, unspecified: Secondary | ICD-10-CM | POA: Insufficient documentation

## 2012-09-01 LAB — URINALYSIS, ROUTINE W REFLEX MICROSCOPIC
Bilirubin Urine: NEGATIVE
Glucose, UA: NEGATIVE mg/dL
Hgb urine dipstick: NEGATIVE
Ketones, ur: NEGATIVE mg/dL
Nitrite: NEGATIVE
Protein, ur: NEGATIVE mg/dL
Specific Gravity, Urine: 1.027 (ref 1.005–1.030)
Urobilinogen, UA: 1 mg/dL (ref 0.0–1.0)
pH: 5.5 (ref 5.0–8.0)

## 2012-09-01 LAB — COMPREHENSIVE METABOLIC PANEL
Albumin: 3.4 g/dL — ABNORMAL LOW (ref 3.5–5.2)
BUN: 12 mg/dL (ref 6–23)
Calcium: 9.3 mg/dL (ref 8.4–10.5)
Chloride: 102 mEq/L (ref 96–112)
Creatinine, Ser: 0.75 mg/dL (ref 0.50–1.10)
Total Bilirubin: 0.3 mg/dL (ref 0.3–1.2)
Total Protein: 7.2 g/dL (ref 6.0–8.3)

## 2012-09-01 LAB — URINE MICROSCOPIC-ADD ON

## 2012-09-01 LAB — CBC WITH DIFFERENTIAL/PLATELET
Basophils Relative: 0 % (ref 0–1)
Eosinophils Absolute: 0.1 10*3/uL (ref 0.0–0.7)
Eosinophils Relative: 1 % (ref 0–5)
HCT: 37.8 % (ref 36.0–46.0)
Hemoglobin: 12.4 g/dL (ref 12.0–15.0)
MCH: 28.3 pg (ref 26.0–34.0)
MCHC: 32.8 g/dL (ref 30.0–36.0)
MCV: 86.3 fL (ref 78.0–100.0)
Monocytes Absolute: 0.4 10*3/uL (ref 0.1–1.0)
Monocytes Relative: 6 % (ref 3–12)

## 2012-09-01 LAB — WET PREP, GENITAL

## 2012-09-01 LAB — POCT PREGNANCY, URINE: Preg Test, Ur: NEGATIVE

## 2012-09-01 MED ORDER — METRONIDAZOLE 500 MG PO TABS: 500.0000 mg | ORAL_TABLET | Freq: Two times a day (BID) | ORAL | Status: DC

## 2012-09-01 NOTE — ED Provider Notes (Signed)
History     CSN: [id number]  Arrival date & time 09/01/12  1723   First MD Initiated Contact with Patient 09/01/12 2032      Chief Complaint  Patient presents with  . Abdominal Pain    (Consider location/radiation/quality/duration/timing/severity/associated sxs/prior treatment) HPI  Pt is concerned about period like cramping. She has a IUD and has not had a period in 3 years. However, after more than a year of not having sex she has just recently been sexually active. She also has a history of endometriosis but does not feel like this is the same. She has not had any N/V/D, fevers, chills, weakness. She has been taking Ibuprofen at home with moderate relief.  Past Medical History  Diagnosis Date  . Seizures     last seizure April 2013    Past Surgical History  Procedure Date  . Breast reduction surgery   . Ganglion cyst excision     No family history on file.  History  Substance Use Topics  . Smoking status: Never Smoker   . Smokeless tobacco: Never Used  . Alcohol Use: No    OB History    Grav Para Term Preterm Abortions TAB SAB Ect Mult Living                  Review of Systems  Review of Systems  Gen: no weight loss, fevers, chills, night sweats  Eyes: no discharge or drainage, no occular pain or visual changes  Nose: no epistaxis or rhinorrhea  Mouth: no dental pain, no sore throat  Neck: no neck pain  Lungs:No wheezing, coughing or hemoptysis CV: no chest pain, palpitations, dependent edema or orthopnea  Abd: + abdominal pain, no nausea, vomiting  GU: no dysuria or gross hematuria , + suprapubic cramping MSK:  No abnormalities  Neuro: no headache, no focal neurologic deficits  Skin: no abnormalities Psyche: negative.   Allergies  Cocoa butter and Codeine  Home Medications   Current Outpatient Rx  Name Route Sig Dispense Refill  . IBUPROFEN 200 MG PO TABS Oral Take 800 mg by mouth every 6 (six) hours as needed. Pain    . GUAIFENESIN ER 1200  MG PO TB12 Oral Take 1 tablet (1,200 mg total) by mouth 2 (two) times daily. 20 each 0  . LEVONORGESTREL 20 MCG/24HR IU IUD Intrauterine 1 each by Intrauterine route once. Inserted March 2010    . METRONIDAZOLE 500 MG PO TABS Oral Take 1 tablet (500 mg total) by mouth 2 (two) times daily. 14 tablet 0    BP 128/82  Pulse 76  Temp 98.3 F (36.8 C) (Oral)  SpO2 99%  Physical Exam  Nursing note and vitals reviewed. Constitutional: She appears well-developed and well-nourished. No distress.  HENT:  Head: Normocephalic and atraumatic.  Eyes: Pupils are equal, round, and reactive to light.  Neck: Normal range of motion. Neck supple.  Cardiovascular: Normal rate and regular rhythm.   Pulmonary/Chest: Effort normal.  Abdominal: Soft. She exhibits no distension and no mass. There is tenderness (mild suprapubic tenderness). There is no rebound and no guarding.  Genitourinary: Uterus normal. Cervix exhibits no motion tenderness, no discharge and no friability. Right adnexum displays no tenderness. Left adnexum displays no tenderness. No erythema, tenderness or bleeding around the vagina. No foreign body around the vagina. No signs of injury around the vagina. Vaginal discharge found.  Neurological: She is alert.  Skin: Skin is warm and dry.    ED Course  Procedures (including  critical care time)  Labs Reviewed  URINALYSIS, ROUTINE W REFLEX MICROSCOPIC - Abnormal; Notable for the following:    APPearance CLOUDY (*)     Leukocytes, UA TRACE (*)     All other components within normal limits  COMPREHENSIVE METABOLIC PANEL - Abnormal; Notable for the following:    Potassium 3.3 (*)     Glucose, Bld 69 (*)     Albumin 3.4 (*)     All other components within normal limits  URINE MICROSCOPIC-ADD ON - Abnormal; Notable for the following:    Squamous Epithelial / LPF FEW (*)     All other components within normal limits  WET PREP, GENITAL - Abnormal; Notable for the following:    Clue Cells Wet  Prep HPF POC RARE (*)     WBC, Wet Prep HPF POC MODERATE (*)     All other components within normal limits  CBC WITH DIFFERENTIAL  POCT PREGNANCY, URINE  GC/CHLAMYDIA PROBE AMP, GENITAL   No results found.   1. BV (bacterial vaginosis)       MDM  Negative urine preg. NO concerning findings on pelvic exam and wet prep reveals white blood cells with a few clue cells. She would like to be called with results if g/c test come back positive than be treated for possible GC in the ED. PT given Flagyl Rx for home.   Pt has been advised of the symptoms that warrant their return to the ED. Patient has voiced understanding and has agreed to follow-up with the PCP or specialist.         Dorthula Matas, PA 09/02/12 [phone]

## 2012-09-01 NOTE — ED Notes (Signed)
Pt presents w/ lower abdominal, supra-pubic. Nausea w/o emesis, no diarrhea, denies urinary Sx. Pt is cramping like she would have w/ menses but has Sharon Benson and has not had period in over 3 years.

## 2012-09-03 LAB — GC/CHLAMYDIA PROBE AMP, GENITAL: Chlamydia, DNA Probe: NEGATIVE

## 2012-09-04 NOTE — ED Provider Notes (Signed)
Medical screening examination/treatment/procedure(s) were performed by non-physician practitioner and as supervising physician I was immediately available for consultation/collaboration.   Carleene Cooper III, MD 09/04/12 [phone]

## 2012-11-18 ENCOUNTER — Emergency Department (HOSPITAL_COMMUNITY): Payer: Self-pay

## 2012-11-18 ENCOUNTER — Emergency Department (HOSPITAL_COMMUNITY)
Admission: EM | Admit: 2012-11-18 | Discharge: 2012-11-18 | Disposition: A | Discharge status: Home / Self Care | Payer: Self-pay | Attending: Emergency Medicine | Admitting: Emergency Medicine

## 2012-11-18 ENCOUNTER — Encounter (HOSPITAL_COMMUNITY): Payer: Self-pay

## 2012-11-18 DIAGNOSIS — Y929 Unspecified place or not applicable: Secondary | ICD-10-CM | POA: Insufficient documentation

## 2012-11-18 DIAGNOSIS — M2550 Pain in unspecified joint: Secondary | ICD-10-CM | POA: Insufficient documentation

## 2012-11-18 DIAGNOSIS — S43409A Unspecified sprain of unspecified shoulder joint, initial encounter: Secondary | ICD-10-CM

## 2012-11-18 DIAGNOSIS — IMO0001 Reserved for inherently not codable concepts without codable children: Secondary | ICD-10-CM | POA: Insufficient documentation

## 2012-11-18 DIAGNOSIS — M25519 Pain in unspecified shoulder: Secondary | ICD-10-CM | POA: Insufficient documentation

## 2012-11-18 DIAGNOSIS — S63509A Unspecified sprain of unspecified wrist, initial encounter: Secondary | ICD-10-CM | POA: Insufficient documentation

## 2012-11-18 DIAGNOSIS — IMO0002 Reserved for concepts with insufficient information to code with codable children: Secondary | ICD-10-CM | POA: Insufficient documentation

## 2012-11-18 DIAGNOSIS — W010XXA Fall on same level from slipping, tripping and stumbling without subsequent striking against object, initial encounter: Secondary | ICD-10-CM | POA: Insufficient documentation

## 2012-11-18 DIAGNOSIS — S63502A Unspecified sprain of left wrist, initial encounter: Secondary | ICD-10-CM

## 2012-11-18 DIAGNOSIS — Y9389 Activity, other specified: Secondary | ICD-10-CM | POA: Insufficient documentation

## 2012-11-18 DIAGNOSIS — Z8669 Personal history of other diseases of the nervous system and sense organs: Secondary | ICD-10-CM | POA: Insufficient documentation

## 2012-11-18 DIAGNOSIS — M25539 Pain in unspecified wrist: Secondary | ICD-10-CM | POA: Insufficient documentation

## 2012-11-18 NOTE — ED Provider Notes (Signed)
Medical screening examination/treatment/procedure(s) were performed by non-physician practitioner and as supervising physician I was immediately available for consultation/collaboration.   David H Yao, MD 11/18/12 2347 

## 2012-11-18 NOTE — ED Notes (Signed)
Patient reports that she slipped on a wet floor, falling and then landing on her left hand/wrist area. Patient has increased pain with movement. Positive radial pulse and is able to wiggle left fingers.

## 2012-11-18 NOTE — ED Provider Notes (Signed)
History     CSN: [id number]  Arrival date & time 11/18/12  1807   First MD Initiated Contact with Patient 11/18/12 1832      Chief Complaint  Patient presents with  . Hand Pain  . Wrist Pain  . Shoulder Pain  . Fall    (Consider location/radiation/quality/duration/timing/severity/associated sxs/prior treatment) HPI Sharon Benson is a 36 y.o. female who presents with complaint of a fall earlier today. States she slipped on wet floor and landed onto left hand. Reports pain in left wrist and left shoulder. Pain with movement of shoulder and wrist. States it is swelling. She has had ice on it on and off since injury. No head injury. No other pain or injuries.    Past Medical History  Diagnosis Date  . Seizures     last seizure April 2013    Past Surgical History  Procedure Date  . Breast reduction surgery   . Ganglion cyst excision     History reviewed. No pertinent family history.  History  Substance Use Topics  . Smoking status: Never Smoker   . Smokeless tobacco: Never Used  . Alcohol Use: No    OB History    Grav Para Term Preterm Abortions TAB SAB Ect Mult Living                  Review of Systems  HENT: Negative for neck pain.   Musculoskeletal: Positive for myalgias and arthralgias. Negative for back pain.  Neurological: Negative for weakness, numbness and headaches.    Allergies  Cocoa butter and Codeine  Home Medications   Current Outpatient Rx  Name  Route  Sig  Dispense  Refill  . ACETAMINOPHEN 325 MG PO TABS   Oral   Take 650 mg by mouth every 6 (six) hours as needed. Pain         . IBUPROFEN 200 MG PO TABS   Oral   Take 800 mg by mouth every 6 (six) hours as needed. Pain         . LEVONORGESTREL 20 MCG/24HR IU IUD   Intrauterine   1 each by Intrauterine route once. Inserted March 2010           BP 119/60  Pulse 61  Temp 98.4 F (36.9 C) (Oral)  Resp 16  SpO2 100%  Physical Exam  Nursing note and vitals  reviewed. Constitutional: She is oriented to person, place, and time. She appears well-developed and well-nourished.  Eyes: Conjunctivae normal are normal.  Neck: Neck supple.  Cardiovascular: Normal rate, regular rhythm and normal heart sounds.   Pulmonary/Chest: Effort normal and breath sounds normal. No respiratory distress. She has no wheezes. She has no rales.  Musculoskeletal:       Swelling noted over ulnar aspect of left wrist. Tender over ulnar wrist joint and over 5th and 4th metacarpals. Pain with movement of the wrist in any directions. Pain with flexion and extension of 4th and 5th phalanges at MCP joints. Pain with palpation over anterior left shoulder. Shoulder appears normal with no bruising, swelling, deformity. Pain with ROM. limited ROM due to pain.  Neurological: She is alert and oriented to person, place, and time.  Skin: Skin is warm and dry.    ED Course  Procedures (including critical care time)  Labs Reviewed - No data to display Dg Wrist Complete Left  11/18/2012  *RADIOLOGY REPORT*  Clinical Data: Fall.  Left wrist pain.  LEFT WRIST - COMPLETE 3+ VIEW  Comparison: None.  Findings: Anatomic alignment.  No fracture.  Soft tissues appear within normal limits.  Scaphoid bone intact.  IMPRESSION: Negative.   Original Report Authenticated By: Andreas Newport, M.D.    Dg Shoulder Left  11/18/2012  *RADIOLOGY REPORT*  Clinical Data: Fall.  Left shoulder pain.  LEFT SHOULDER - 2+ VIEW  Comparison: None.  Findings: Left shoulder located.  No fracture.  Visualized left chest appears normal.  IMPRESSION: Negative.   Original Report Authenticated By: Andreas Newport, M.D.    Dg Hand Complete Left  11/18/2012  *RADIOLOGY REPORT*  Clinical Data: Fall.  Left hand pain.  LEFT HAND - COMPLETE 3+ VIEW  Comparison: None.  Findings: Anatomic alignment.  No fracture.  Soft tissues appear within normal limits.  IMPRESSION: Negative.   Original Report Authenticated By: Andreas Newport, M.D.       1. Sprain of wrist, left   2. Shoulder sprain       MDM  PT with mechanical fall. Injury to the left hand, wrist, and shoulder. X-rays negative. neurovascularly intact. Velcro splint for a wrist sprain and sling for shoulder sprain for comfort. Follow up as needed.         Lottie Mussel, [place] 11/18/12 2333

## 2013-06-23 ENCOUNTER — Encounter (HOSPITAL_COMMUNITY): Payer: Self-pay

## 2013-06-23 ENCOUNTER — Emergency Department (HOSPITAL_COMMUNITY): Payer: BC Managed Care – PPO

## 2013-06-23 ENCOUNTER — Emergency Department (HOSPITAL_COMMUNITY)
Admission: EM | Admit: 2013-06-23 | Discharge: 2013-06-23 | Disposition: A | Discharge status: Home / Self Care | Payer: BC Managed Care – PPO | Attending: Emergency Medicine | Admitting: Emergency Medicine

## 2013-06-23 DIAGNOSIS — R110 Nausea: Secondary | ICD-10-CM

## 2013-06-23 DIAGNOSIS — Z8669 Personal history of other diseases of the nervous system and sense organs: Secondary | ICD-10-CM | POA: Insufficient documentation

## 2013-06-23 DIAGNOSIS — R1032 Left lower quadrant pain: Secondary | ICD-10-CM | POA: Insufficient documentation

## 2013-06-23 DIAGNOSIS — R42 Dizziness and giddiness: Secondary | ICD-10-CM | POA: Insufficient documentation

## 2013-06-23 DIAGNOSIS — R112 Nausea with vomiting, unspecified: Secondary | ICD-10-CM | POA: Insufficient documentation

## 2013-06-23 DIAGNOSIS — R109 Unspecified abdominal pain: Secondary | ICD-10-CM

## 2013-06-23 LAB — CBC WITH DIFFERENTIAL/PLATELET
Lymphocytes Relative: 30 % (ref 12–46)
Lymphs Abs: 1.9 10*3/uL (ref 0.7–4.0)
MCV: 85.5 fL (ref 78.0–100.0)
Neutrophils Relative %: 63 % (ref 43–77)
Platelets: 237 10*3/uL (ref 150–400)
RBC: 4.54 MIL/uL (ref 3.87–5.11)
WBC: 6.4 10*3/uL (ref 4.0–10.5)

## 2013-06-23 LAB — COMPREHENSIVE METABOLIC PANEL
ALT: 14 U/L (ref 0–35)
Alkaline Phosphatase: 82 U/L (ref 39–117)
CO2: 30 mEq/L (ref 19–32)
GFR calc Af Amer: >90 mL/min (ref >90)
GFR calc non Af Amer: >90 mL/min (ref >90)
Glucose, Bld: 103 mg/dL — ABNORMAL HIGH (ref 70–99)
Potassium: 3.6 mEq/L (ref 3.5–5.1)
Sodium: 139 mEq/L (ref 135–145)

## 2013-06-23 LAB — URINALYSIS, ROUTINE W REFLEX MICROSCOPIC
Bilirubin Urine: NEGATIVE
Leukocytes, UA: NEGATIVE
Nitrite: NEGATIVE
Specific Gravity, Urine: 1.022 (ref 1.005–1.030)
pH: 5.5 (ref 5.0–8.0)

## 2013-06-23 MED ORDER — ONDANSETRON 4 MG PO TBDP: 4.0000 mg | ORAL_TABLET | Freq: Three times a day (TID) | ORAL | Status: DC | PRN

## 2013-06-23 MED ORDER — ONDANSETRON HCL 4 MG/2ML IJ SOLN
4.0000 mg | Freq: Once | INTRAMUSCULAR | Status: AC
  Administered 2013-06-23: 4 mg via INTRAVENOUS
  Filled 2013-06-23: qty 2

## 2013-06-23 MED ORDER — SODIUM CHLORIDE 0.9 % IV BOLUS (SEPSIS)
1000.0000 mL | Freq: Once | INTRAVENOUS | Status: AC
  Administered 2013-06-23: 1000 mL via INTRAVENOUS

## 2013-06-23 NOTE — ED Notes (Signed)
She c/o a feeling of "fullness and bloating" at mid abd., plus unrelenting nausea x 1 week.  She states she vomited x 1 today (and that is the only time she has vomited with this current symptomology).  She is in no distress.

## 2013-06-23 NOTE — ED Provider Notes (Signed)
History    CSN: [id number] Arrival date & time 06/23/13  1638  First MD Initiated Contact with Patient 06/23/13 1739     Chief Complaint  Patient presents with  . Nausea   (Consider location/radiation/quality/duration/timing/severity/associated sxs/prior Treatment) The history is provided by the patient. No language interpreter was used.   Patient presents with concern of nausea. Symptoms began approximately one week ago.  There has been minimal associated abdominal pain.  There've been 2 episodes of emesis, both 2 days ago. Patient denies new vaginal bleeding, hematuria, dysuria, confusion, dislocation, fever. No clear precipitant. No clear alleviating or exacerbating factors. Patient took a home pregnancy test that was negative. She does state that the week prior to the onset of symptoms she had an unusual menstrual cycle.   Past Medical History  Diagnosis Date  . Seizures     last seizure April 2013   Past Surgical History  Procedure Laterality Date  . Breast reduction surgery    . Ganglion cyst excision     History reviewed. No pertinent family history. History  Substance Use Topics  . Smoking status: Never Smoker   . Smokeless tobacco: Never Used  . Alcohol Use: No   OB History   Grav Para Term Preterm Abortions TAB SAB Ect Mult Living                 Review of Systems  Constitutional:       Per HPI, otherwise negative  HENT:       Per HPI, otherwise negative  Respiratory:       Per HPI, otherwise negative  Cardiovascular:       Per HPI, otherwise negative  Gastrointestinal: Positive for nausea, vomiting and abdominal pain.  Endocrine:       Negative aside from HPI  Genitourinary:       Neg aside from HPI   Musculoskeletal:       Per HPI, otherwise negative  Skin: Negative.   Neurological: Positive for light-headedness. Negative for seizures, syncope, weakness and numbness.    Allergies  Cocoa butter and Codeine  Home Medications   Current  Outpatient Rx  Name  Route  Sig  Dispense  Refill  . flintstones complete (FLINTSTONES) 60 MG chewable tablet   Oral   Chew 2 tablets by mouth daily.         Marland Kitchen ibuprofen (ADVIL,MOTRIN) 200 MG tablet   Oral   Take 800 mg by mouth every 6 (six) hours as needed. Pain         . Thyroid 48.75 MG TABS   Oral   Take 48.75 mg by mouth daily.         Marland Kitchen levonorgestrel (MIRENA) 20 MCG/24HR IUD   Intrauterine   1 each by Intrauterine route once. Inserted March 2010          BP 124/85  Pulse 89  Temp(Src) 98.1 F (36.7 C) (Oral)  Resp 16  SpO2 98% Physical Exam  Nursing note and vitals reviewed. Constitutional: She is oriented to person, place, and time. She appears well-developed and well-nourished. No distress.  HENT:  Head: Normocephalic and atraumatic.  Eyes: Conjunctivae and EOM are normal.  Cardiovascular: Normal rate and regular rhythm.   Pulmonary/Chest: Effort normal and breath sounds normal. No stridor. No respiratory distress.  Abdominal: Soft. Normal appearance and bowel sounds are normal. She exhibits no distension. There is no hepatosplenomegaly or hepatomegaly. There is tenderness in the left lower quadrant. There is no rigidity,  no rebound, no guarding and no CVA tenderness.  Musculoskeletal: She exhibits no edema.  Neurological: She is alert and oriented to person, place, and time. No cranial nerve deficit.  Skin: Skin is warm and dry.  Psychiatric: She has a normal mood and affect.    ED Course  Procedures (including critical care time) Labs Reviewed  URINALYSIS, ROUTINE W REFLEX MICROSCOPIC  CBC WITH DIFFERENTIAL  COMPREHENSIVE METABOLIC PANEL  LIPASE, BLOOD  POCT PREGNANCY, URINE   No results found. No diagnosis found.   9:13 PM Patient remains calm. VS stable.  11:07 PM Patient in no distress.  Vitals are stable.  She was made aware of additional results, including ultrasound.  MDM  Patient presents with nausea, and on exam she has mild  tenderness to palpation about the left lower quadrant.  With the patient's description of an atypical menstrual cycle, ultrasound was performed.  Labs, ultrasound all reassuring, and with unremarkable vital signs, there suspicion for acute ongoing pathology.  Patient was discharged in stable condition after discussion on the need to follow up promptly with her obstetrician, and an explicit description of all return precautions.  Gerhard Munch, MD 06/23/13 2308

## 2013-07-09 ENCOUNTER — Other Ambulatory Visit: Payer: Self-pay | Admitting: Obstetrics and Gynecology

## 2013-07-09 DIAGNOSIS — N63 Unspecified lump in unspecified breast: Secondary | ICD-10-CM

## 2013-07-28 ENCOUNTER — Inpatient Hospital Stay (HOSPITAL_COMMUNITY): Payer: BC Managed Care – PPO

## 2013-07-28 ENCOUNTER — Inpatient Hospital Stay (HOSPITAL_COMMUNITY)
Admission: AD | Admit: 2013-07-28 | Discharge: 2013-07-28 | Disposition: A | Discharge status: Home / Self Care | Payer: BC Managed Care – PPO | Source: Ambulatory Visit | Attending: Obstetrics and Gynecology | Admitting: Obstetrics and Gynecology

## 2013-07-28 ENCOUNTER — Encounter (HOSPITAL_COMMUNITY): Payer: Self-pay | Admitting: *Deleted

## 2013-07-28 DIAGNOSIS — O26859 Spotting complicating pregnancy, unspecified trimester: Secondary | ICD-10-CM | POA: Insufficient documentation

## 2013-07-28 DIAGNOSIS — R109 Unspecified abdominal pain: Secondary | ICD-10-CM | POA: Insufficient documentation

## 2013-07-28 DIAGNOSIS — O209 Hemorrhage in early pregnancy, unspecified: Secondary | ICD-10-CM

## 2013-07-28 DIAGNOSIS — O4690 Antepartum hemorrhage, unspecified, unspecified trimester: Secondary | ICD-10-CM

## 2013-07-28 HISTORY — DX: Hypothyroidism, unspecified: E03.9

## 2013-07-28 LAB — WET PREP, GENITAL
Clue Cells Wet Prep HPF POC: NONE SEEN
Trich, Wet Prep: NONE SEEN
Yeast Wet Prep HPF POC: NONE SEEN

## 2013-07-28 LAB — CBC
HCT: 36.3 % (ref 36.0–46.0)
Hemoglobin: 12.1 g/dL (ref 12.0–15.0)
MCV: 85.6 fL (ref 78.0–100.0)
RBC: 4.24 MIL/uL (ref 3.87–5.11)
RDW: 13.7 % (ref 11.5–15.5)
WBC: 5.3 10*3/uL (ref 4.0–10.5)

## 2013-07-28 LAB — URINALYSIS, ROUTINE W REFLEX MICROSCOPIC
Bilirubin Urine: NEGATIVE
Hgb urine dipstick: NEGATIVE
Specific Gravity, Urine: 1.015 (ref 1.005–1.030)
Urobilinogen, UA: 0.2 mg/dL (ref 0.0–1.0)

## 2013-07-28 NOTE — MAU Note (Signed)
Pt stated she had som spotting today while she was at church. C/O mild cramping earlier today as well.

## 2013-07-28 NOTE — MAU Note (Signed)
Took pregnancy test at home august 9 with positive results. Started spotting today while in church when wiped. A little cramping noted while at church as well. Denies nausea or vomiting. Denies any other discharge.

## 2013-07-28 NOTE — MAU Provider Note (Signed)
History     CSN: [id number]  Arrival date and time: 07/28/13 1232   First Provider Initiated Contact with Patient 07/28/13 1336      Chief Complaint  Patient presents with  . Vaginal Bleeding   Vaginal Bleeding    Sharon Benson is a 37 y.o. G2P1001 at [redacted]w[redacted]d who presents today with spotting and cramping. She states that she had intercourse two nights ago, and then today at church she was having cramping. When she went to the bathroom she noticed brown spotting while wiping. This happened twice and after the second time she decided to come here. She states that she has not been seen at [facility] for this pregnancy, but has an appointment scheduled.   Past Medical History  Diagnosis Date  . Seizures     last seizure April 2013  . Hypothyroidism     Past Surgical History  Procedure Laterality Date  . Breast reduction surgery    . Ganglion cyst excision      History reviewed. No pertinent family history.  History  Substance Use Topics  . Smoking status: Former Games developer  . Smokeless tobacco: Never Used  . Alcohol Use: No    Allergies:  Allergies  Allergen Reactions  . Other Anaphylaxis and Itching    canalope  . Cocoa Butter Hives  . Codeine Other (See Comments)    seizures    Prescriptions prior to admission  Medication Sig Dispense Refill  . flintstones complete (FLINTSTONES) 60 MG chewable tablet Chew 2 tablets by mouth daily.      Marland Kitchen OVER THE COUNTER MEDICATION Take 17 g by mouth daily as needed (constipation).      . Thyroid 48.75 MG TABS Take 48.75 mg by mouth daily.        Review of Systems  Genitourinary: Positive for vaginal bleeding.   Physical Exam   Blood pressure 128/78, pulse 87, temperature 98 F (36.7 C), resp. rate 18, height 5\' 4"  (1.626 m), weight 111.766 kg (246 lb 6.4 oz), last menstrual period 06/11/2013.  Physical Exam  Nursing note and vitals reviewed. Constitutional: She is oriented to person, place, and time. She appears  well-developed and well-nourished. No distress.  Cardiovascular: Normal rate.   Respiratory: Effort normal.  GI: Soft. There is no tenderness.  Genitourinary:   External: no lesion Vagina: small amount of brown bleeding  Cervix: pink, smooth, no CMT Uterus: not enlarged     Neurological: She is alert and oriented to person, place, and time.  Skin: Skin is warm and dry.  Psychiatric: She has a normal mood and affect.    MAU Course  Procedures  Results for orders placed during the hospital encounter of 07/28/13 (from the past 24 hour(s))  URINALYSIS, ROUTINE W REFLEX MICROSCOPIC     Status: None   Collection Time    07/28/13 12:50 PM      Result Value Range   Color, Urine YELLOW  YELLOW   APPearance CLEAR  CLEAR   Specific Gravity, Urine 1.015  1.005 - 1.030   pH 6.5  5.0 - 8.0   Glucose, UA NEGATIVE  NEGATIVE mg/dL   Hgb urine dipstick NEGATIVE  NEGATIVE   Bilirubin Urine NEGATIVE  NEGATIVE   Ketones, ur NEGATIVE  NEGATIVE mg/dL   Protein, ur NEGATIVE  NEGATIVE mg/dL   Urobilinogen, UA 0.2  0.0 - 1.0 mg/dL   Nitrite NEGATIVE  NEGATIVE   Leukocytes, UA NEGATIVE  NEGATIVE  POCT PREGNANCY, URINE  Status: Abnormal   Collection Time    07/28/13 12:53 PM      Result Value Range   Preg Test, Ur POSITIVE (*) NEGATIVE  CBC     Status: None   Collection Time    07/28/13  1:13 PM      Result Value Range   WBC 5.3  4.0 - 10.5 K/uL   RBC 4.24  3.87 - 5.11 MIL/uL   Hemoglobin 12.1  12.0 - 15.0 g/dL   HCT 16.1  09.6 - 04.5 %   MCV 85.6  78.0 - 100.0 fL   MCH 28.5  26.0 - 34.0 pg   MCHC 33.3  30.0 - 36.0 g/dL   RDW 40.9  81.1 - 91.4 %   Platelets 197  150 - 400 K/uL  ABO/RH     Status: None   Collection Time    07/28/13  1:13 PM      Result Value Range   ABO/RH(D) O POS    HCG, QUANTITATIVE, PREGNANCY     Status: Abnormal   Collection Time    07/28/13  1:13 PM      Result Value Range   hCG, Beta Chain, Quant, S 10248 (*) <5 mIU/mL  WET PREP, GENITAL     Status:  Abnormal   Collection Time    07/28/13  1:45 PM      Result Value Range   Yeast Wet Prep HPF POC NONE SEEN  NONE SEEN   Trich, Wet Prep NONE SEEN  NONE SEEN   Clue Cells Wet Prep HPF POC NONE SEEN  NONE SEEN   WBC, Wet Prep HPF POC FEW (*) NONE SEEN   US Ob Comp Less 14 Wks  07/28/2013   *RADIOLOGY REPORT*  Clinical Data: early pregnancy, spotting and cramping  TRANSABDOMINAL AND TRANSVAGINAL ULTRASOUND OF PELVIS Technique:  Both transabdominal and transvaginal ultrasound examinations of the pelvis were performed. Transabdominal technique was performed for global imaging of the pelvis including uterus, ovaries, adnexal regions, and pelvic cul-de-sac.  It was necessary to proceed with endovaginal exam following the transabdominal exam to visualize the contents of the endometrium.  Comparison:  06/23/13  Findings:  Uterus: Small posterior fibroid measuring about 1.5cm.   There is a single intrauterine gestation containing a yolk sac and an embryo within the gestational sac.  Cardiac activity is identified at 104 beats per minute.Crown rump length suggests a gestational age of [redacted]weeks 0 days.  Endometrium: Normal in thickness and appearance  Right ovary:  Normal appearance/no adnexal mass  Left ovary: Normal.  Small corpus luteum noted.  Other findings: Very small subchorionic hemorrhage adjacent to the gestational sac seen.  IMPRESSION: Live intrauterine gestation.  Small subchorionic hemorrhage.   Original Report Authenticated By: Esperanza Heir, M.D.   US Ob Transvaginal  07/28/2013   *RADIOLOGY REPORT*  Clinical Data: early pregnancy, spotting and cramping  TRANSABDOMINAL AND TRANSVAGINAL ULTRASOUND OF PELVIS Technique:  Both transabdominal and transvaginal ultrasound examinations of the pelvis were performed. Transabdominal technique was performed for global imaging of the pelvis including uterus, ovaries, adnexal regions, and pelvic cul-de-sac.  It was necessary to proceed with endovaginal exam  following the transabdominal exam to visualize the contents of the endometrium.  Comparison:  06/23/13  Findings:  Uterus: Small posterior fibroid measuring about 1.5cm.   There is a single intrauterine gestation containing a yolk sac and an embryo within the gestational sac.  Cardiac activity is identified at 104 beats per minute.Crown rump length suggests a gestational  age of [redacted]weeks 0 days.  Endometrium: Normal in thickness and appearance  Right ovary:  Normal appearance/no adnexal mass  Left ovary: Normal.  Small corpus luteum noted.  Other findings: Very small subchorionic hemorrhage adjacent to the gestational sac seen.  IMPRESSION: Live intrauterine gestation.  Small subchorionic hemorrhage.   Original Report Authenticated By: Esperanza Heir, M.D.     Assessment and Plan   1. Vaginal bleeding in pregnancy, first trimester    Start [facility] as soon possible Bleeding precautions reviewed Return to MAU as needed   Tawnya Crook 07/28/2013, 1:46 PM

## 2013-07-29 ENCOUNTER — Ambulatory Visit
Admission: RE | Admit: 2013-07-29 | Discharge: 2013-07-29 | Disposition: A | Discharge status: Home / Self Care | Payer: BC Managed Care – PPO | Source: Ambulatory Visit | Attending: Obstetrics and Gynecology | Admitting: Obstetrics and Gynecology

## 2013-07-29 DIAGNOSIS — N63 Unspecified lump in unspecified breast: Secondary | ICD-10-CM

## 2013-07-29 LAB — GC/CHLAMYDIA PROBE AMP: CT Probe RNA: NEGATIVE

## 2013-08-15 ENCOUNTER — Encounter (HOSPITAL_COMMUNITY): Payer: Self-pay | Admitting: *Deleted

## 2013-08-15 ENCOUNTER — Inpatient Hospital Stay (HOSPITAL_COMMUNITY)
Admission: AD | Admit: 2013-08-15 | Discharge: 2013-08-15 | Disposition: A | Discharge status: Home / Self Care | Payer: BC Managed Care – PPO | Source: Ambulatory Visit | Attending: Obstetrics and Gynecology | Admitting: Obstetrics and Gynecology

## 2013-08-15 DIAGNOSIS — O039 Complete or unspecified spontaneous abortion without complication: Secondary | ICD-10-CM | POA: Insufficient documentation

## 2013-08-15 DIAGNOSIS — N949 Unspecified condition associated with female genital organs and menstrual cycle: Secondary | ICD-10-CM | POA: Insufficient documentation

## 2013-08-15 DIAGNOSIS — R109 Unspecified abdominal pain: Secondary | ICD-10-CM | POA: Insufficient documentation

## 2013-08-15 MED ORDER — KETOROLAC TROMETHAMINE 60 MG/2ML IM SOLN
60.0000 mg | Freq: Once | INTRAMUSCULAR | Status: AC
  Administered 2013-08-15: 60 mg via INTRAMUSCULAR
  Filled 2013-08-15: qty 2

## 2013-08-15 MED ORDER — HYDROMORPHONE HCL PF 1 MG/ML IJ SOLN
1.0000 mg | Freq: Once | INTRAMUSCULAR | Status: AC
  Administered 2013-08-15: 1 mg via INTRAVENOUS
  Filled 2013-08-15: qty 1

## 2013-08-15 MED ORDER — LACTATED RINGERS IV SOLN
INTRAVENOUS | Status: DC
  Administered 2013-08-15: 14:00:00 via INTRAVENOUS

## 2013-08-15 MED ORDER — TRAMADOL HCL 50 MG PO TABS: 50.0000 mg | ORAL_TABLET | Freq: Four times a day (QID) | ORAL | Status: DC | PRN

## 2013-08-15 NOTE — MAU Note (Signed)
Patient Sharon Benson arrived via EMS with c/o bilateral lower quadrant abdominal pain/cramping and vaginal bleeding since Saturday. Pain rating 10/10 at this time. Patient having to wear a liner; scant red bleeding noted.  Patient states had miscarriage over weekend that she had to vaginally insert medication; doesn't remember name of medication at this time. Does states having milky discharge. 20G IV in Right AC noted on arrival.

## 2013-08-15 NOTE — MAU Provider Note (Signed)
History     CSN: [id number]  Arrival date and time: 08/15/13 1324   None     Chief Complaint  Patient presents with  . Abdominal Pain  . Vaginal Bleeding   HPI Comments: Sharon Benson 37 y.o. G2P1001 who arrives to MAU today via EMS. Her complaint is abdominal / pelvic pain and bleeding following a miscarriage with Cytotec over the weekend. Her pain is 10/10.  She was seen by Dr Henderson Cloud on Friday when it was determined the fetus did not have a heart beat. She was given Cytotec vaginally and over the weekend she has " cramped and bled " most of the weekend. She states she had 2 seizures on Sunday morning early. She has history seizures and does not take any anti seizure meds. Blood type O positive.     Patient is a 37 y.o. female presenting with abdominal pain and vaginal bleeding.  Abdominal Pain The primary symptoms of the illness include abdominal pain.  Additional symptoms associated with the illness include back pain.  Vaginal Bleeding Associated symptoms include abdominal pain and weakness.      Past Medical History  Diagnosis Date  . Hypothyroidism   . Seizures     last seizure 08/11/13    Past Surgical History  Procedure Laterality Date  . Breast reduction surgery    . Ganglion cyst excision      History reviewed. No pertinent family history.  History  Substance Use Topics  . Smoking status: Former Games developer  . Smokeless tobacco: Never Used  . Alcohol Use: No    Allergies:  Allergies  Allergen Reactions  . Other Anaphylaxis and Itching    cantaloupe  . Cocoa Butter Hives  . Codeine Other (See Comments)    seizures    Prescriptions prior to admission  Medication Sig Dispense Refill  . flintstones complete (FLINTSTONES) 60 MG chewable tablet Chew 2 tablets by mouth daily.      . Thyroid 48.75 MG TABS Take 48.75 mg by mouth daily.        Review of Systems  HENT: Negative.   Eyes: Negative.   Respiratory: Negative.   Cardiovascular: Negative.    Gastrointestinal: Positive for abdominal pain.  Genitourinary: Negative.        Vaginal bleeding  Musculoskeletal: Positive for back pain.  Skin: Negative.   Neurological: Positive for dizziness and weakness.  Psychiatric/Behavioral: Negative.    Physical Exam   Blood pressure 124/73, pulse 95, temperature 98.2 F (36.8 C), temperature source Oral, resp. rate 20, last menstrual period 06/11/2013.  Physical Exam  Constitutional: She is oriented to person, place, and time. She appears well-developed and well-nourished. She appears distressed.  HENT:  Head: Normocephalic and atraumatic.  GI: Soft. There is tenderness.  Genitourinary:  External negative Vaginal vault small amt bleeding Cervix : products of conception hanging from os/ removed and will send to pathology. Open 1 cm Biman: negative  Musculoskeletal: Normal range of motion.  Neurological: She is alert and oriented to person, place, and time.  Skin: Skin is warm and dry.  Psychiatric: She has a normal mood and affect.  No results found for this or any previous visit (from the past 24 hour(s)).   MAU Course  Procedures  MDM Dilaudid 1 mg/ pt resting comfortably and pain gone Toradol 60 mg IM now Spoke with Dr Kirtland Bouchard. Ross who agreed with plan   Assessment and Plan  A: SAB  P: Advised bleeding and cramping may continue Keep appointment  with Dr Henderson Cloud next week Ultram 50 mg po q6 hrs prn  Sharon Benson 08/15/2013, 3:17 PM

## 2013-09-03 ENCOUNTER — Encounter: Payer: Self-pay | Admitting: Diagnostic Neuroimaging

## 2013-09-03 ENCOUNTER — Ambulatory Visit (INDEPENDENT_AMBULATORY_CARE_PROVIDER_SITE_OTHER): Payer: BC Managed Care – PPO | Admitting: Diagnostic Neuroimaging

## 2013-09-03 VITALS — BP 125/77 | HR 82 | Ht 64.0 in | Wt 250.0 lb

## 2013-09-03 DIAGNOSIS — R569 Unspecified convulsions: Secondary | ICD-10-CM

## 2013-09-03 DIAGNOSIS — R55 Syncope and collapse: Secondary | ICD-10-CM

## 2013-09-03 NOTE — Patient Instructions (Signed)
No driving until further notice/testing completed.

## 2013-09-03 NOTE — Progress Notes (Signed)
[facility]  PATIENT: Sharon Benson DOB: 05/11/1976  REFERRING CLINICIAN: Henderson Cloud HISTORY FROM: patient  REASON FOR VISIT: new consult   HISTORICAL  CHIEF COMPLAINT:  Chief Complaint  Patient presents with  . Migraine    Np..#7    HISTORY OF PRESENT ILLNESS:   37 year old right-handed female here for evaluation of seizure.  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 She had episodes where her eyes rolled back she would make a moaning, moderate noise and have some shaking. She's never on anticonvulsant medication. By puberty she was having 6 of these events per year. In her 32s patient had no events. In early 30s she was having 10-12 in one year.  Symptoms seem to be worse when she is fevers. They're under better control when she underwent stress. She has a warning sensation for some of these lasting for about 1 second which he feels a heat rising sensation in her stomach and sweaty sensation.  At age 18 years old she was having daily events during her pregnancy. She gave birth to a full-term child. That child had one seizure at age 85-year-old but now is doing fine.  Around 08/15/13, patient was [redacted] weeks pregnant, then had abdominal pain and bleeding, diagnosed with fetal demise. She was given intravaginal cytotec on Friday, then had 2 seizures on Sunday.    REVIEW OF SYSTEMS: Full 14 system review of systems performed and notable only for fatigue feeling cold headaches seizure or not asleep.   ALLERGIES: Allergies  Allergen Reactions  . Other Anaphylaxis and Itching    cantaloupe  . Cocoa Butter Hives  . Codeine Other (See Comments)    seizures    HOME MEDICATIONS:  Outpatient Prescriptions Prior to Visit  Medication Sig Dispense Refill  . flintstones complete (FLINTSTONES) 60 MG chewable tablet Chew 2 tablets by mouth daily.      . Thyroid 48.75 MG TABS Take 48.75 mg by mouth daily.      . traMADol (ULTRAM) 50 MG tablet Take  1 tablet (50 mg total) by mouth every 6 (six) hours as needed for pain.  15 tablet  0   No facility-administered medications prior to visit.    PAST MEDICAL HISTORY: Past Medical History  Diagnosis Date  . Hypothyroidism   . Seizures     last seizure 08/11/13    PAST SURGICAL HISTORY: Past Surgical History  Procedure Laterality Date  . Breast reduction surgery    . Ganglion cyst excision      FAMILY HISTORY: Family History  Problem Relation Age of Onset  . Lung cancer Maternal Grandmother   . Colon cancer Maternal Grandmother   . Cancer Other   . Diabetes Other   . High blood pressure Other     SOCIAL HISTORY:  History   Social History  . Marital Status: Married    Spouse Name: N/A    Number of Children: N/A  . Years of Education: N/A   Occupational History  . [employer]    Social History Main Topics  . Smoking status: Former Games developer  . Smokeless tobacco: Never Used  . Alcohol Use: No  . Drug Use: No  . Sexual Activity: Yes    Birth Control/ Protection: None   Other Topics Concern  . Not on file   Social History Narrative  . No narrative on file     PHYSICAL EXAM  Filed Vitals:   09/03/13 0907  BP: 125/77  Pulse: 82  Height: 5\' 4"  (1.626 m)  Weight: 250 lb (113.399 kg)    Not recorded    Body mass index is 42.89 kg/(m^2).  GENERAL EXAM: Patient is in no distress  CARDIOVASCULAR: Regular rate and rhythm, no murmurs, no carotid bruits  NEUROLOGIC: MENTAL STATUS: awake, alert, language fluent, comprehension intact, naming intact CRANIAL NERVE: no papilledema on fundoscopic exam, pupils equal and reactive to light, visual fields full to confrontation, extraocular muscles intact, no nystagmus, facial sensation and strength symmetric, uvula midline, shoulder shrug symmetric, tongue midline. MOTOR: normal bulk and tone, full strength in the BUE, BLE SENSORY: normal and symmetric to light touch, pinprick, temperature, vibration COORDINATION:  finger-nose-finger, fine finger movements normal REFLEXES: deep tendon reflexes present and symmetric GAIT/STATION: narrow based gait; romberg is negative   DIAGNOSTIC DATA (LABS, IMAGING, TESTING) - I reviewed patient records, labs, notes, testing and imaging myself where available.  Lab Results  Component Value Date   WBC 5.3 07/28/2013   HGB 12.1 07/28/2013   HCT 36.3 07/28/2013   MCV 85.6 07/28/2013   PLT 197 07/28/2013      Component Value Date/Time   NA 139 06/23/2013 1815   K 3.6 06/23/2013 1815   CL 102 06/23/2013 1815   CO2 30 06/23/2013 1815   GLUCOSE 103* 06/23/2013 1815   BUN 11 06/23/2013 1815   CREATININE 0.83 06/23/2013 1815   CALCIUM 9.7 06/23/2013 1815   PROT 7.7 06/23/2013 1815   ALBUMIN 3.5 06/23/2013 1815   AST 16 06/23/2013 1815   ALT 14 06/23/2013 1815   ALKPHOS 82 06/23/2013 1815   BILITOT 0.3 06/23/2013 1815   GFRNONAA >90 06/23/2013 1815   GFRAA >90 06/23/2013 1815   No results found for this basename: CHOL, HDL, LDLCALC, LDLDIRECT, TRIG, CHOLHDL   No results found for this basename: HGBA1C   No results found for this basename: VITAMINB12   Lab Results  Component Value Date   TSH 1.44 07/20/2010      ASSESSMENT AND PLAN  37 y.o. year old female here with long history of abnormal spells, suspicious for seizure vs syncope.  Ddx: provoked syncope vs seizure (temporal lobe)  PLAN: Orders Placed This Encounter  Procedures  . MR Brain W Wo Contrast  . Ambulatory referral to Cardiology  . EEG adult  - no driving until further testing completed - no anti-seizure meds for now  Return in about 3 months (around 12/03/2013).    Suanne Marker, MD 09/03/2013, 10:04 AM Certified in Neurology, Neurophysiology and Neuroimaging  [facility] [address] [place], [place] [address] [phone]

## 2013-09-09 ENCOUNTER — Ambulatory Visit (INDEPENDENT_AMBULATORY_CARE_PROVIDER_SITE_OTHER): Payer: BC Managed Care – PPO

## 2013-09-09 DIAGNOSIS — R55 Syncope and collapse: Secondary | ICD-10-CM

## 2013-09-09 DIAGNOSIS — R569 Unspecified convulsions: Secondary | ICD-10-CM

## 2013-10-02 ENCOUNTER — Ambulatory Visit: Payer: BC Managed Care – PPO | Admitting: Cardiology

## 2013-10-08 ENCOUNTER — Telehealth: Payer: Self-pay | Admitting: Diagnostic Neuroimaging

## 2013-10-08 NOTE — Telephone Encounter (Signed)
I called patient and reviewed EEG result.  Hasn't had MRI yet, and thinks she may be pregnant again. If pregnancy test is negative, then she will call back to schedule MRI.  -VRP

## 2013-10-08 NOTE — Procedures (Signed)
   [facility]  EEG (ELECTROENCEPHALOGRAM) REPORT   STUDY DATE: 09/09/13 PATIENT NAME: Sharon Benson DOB: 1976/03/17 MRN: [id number]  ORDERING CLINICIAN: Joycelyn Schmid, MD   TECHNOLOGIST: Gearldine Shown TECHNIQUE: Electroencephalogram was recorded utilizing standard 10-20 system of lead placement and reformatted into average and bipolar montages.  RECORDING TIME: 24 minutes ACTIVATION: hyperventilation and photic stimulation  CLINICAL INFORMATION: 38 year old female with seizure vs syncope.  FINDINGS: Background rhythms of 8-9 hertz and 60-70 microvolts. There is intermittent, frontal rhythmic delta activity noted (FIRDA). No focal, lateralizing, epileptiform activity or seizures are seen. Patient recorded in the awake state.   IMPRESSION:  Equivocal EEG in the awake state demonstrating intermittent, frontal rhythmic delta activity noted (FIRDA). This is non-specific but can be seen in toxic-metabolic disturbances or structural abnormalities, but does not suggest an epileptic predisposition. No focal, lateralizing, epileptiform activity or seizures are seen.    INTERPRETING PHYSICIAN:  Suanne Marker, MD Certified in Neurology, Neurophysiology and Neuroimaging  [facility] [address], [address] [place], [place] [address] [phone]

## 2013-10-14 ENCOUNTER — Ambulatory Visit: Payer: BC Managed Care – PPO | Admitting: Cardiology

## 2013-10-27 ENCOUNTER — Emergency Department (HOSPITAL_COMMUNITY)
Admission: EM | Admit: 2013-10-27 | Discharge: 2013-10-27 | Disposition: A | Discharge status: Home / Self Care | Payer: PRIVATE HEALTH INSURANCE | Attending: Emergency Medicine | Admitting: Emergency Medicine

## 2013-10-27 ENCOUNTER — Encounter (HOSPITAL_COMMUNITY): Payer: Self-pay | Admitting: Emergency Medicine

## 2013-10-27 ENCOUNTER — Emergency Department (HOSPITAL_COMMUNITY): Payer: PRIVATE HEALTH INSURANCE

## 2013-10-27 DIAGNOSIS — O9989 Other specified diseases and conditions complicating pregnancy, childbirth and the puerperium: Secondary | ICD-10-CM | POA: Insufficient documentation

## 2013-10-27 DIAGNOSIS — O210 Mild hyperemesis gravidarum: Secondary | ICD-10-CM | POA: Insufficient documentation

## 2013-10-27 DIAGNOSIS — Z8669 Personal history of other diseases of the nervous system and sense organs: Secondary | ICD-10-CM | POA: Insufficient documentation

## 2013-10-27 DIAGNOSIS — E669 Obesity, unspecified: Secondary | ICD-10-CM | POA: Insufficient documentation

## 2013-10-27 DIAGNOSIS — Z87891 Personal history of nicotine dependence: Secondary | ICD-10-CM | POA: Insufficient documentation

## 2013-10-27 DIAGNOSIS — Z79899 Other long term (current) drug therapy: Secondary | ICD-10-CM | POA: Insufficient documentation

## 2013-10-27 DIAGNOSIS — E079 Disorder of thyroid, unspecified: Secondary | ICD-10-CM | POA: Insufficient documentation

## 2013-10-27 DIAGNOSIS — Z3490 Encounter for supervision of normal pregnancy, unspecified, unspecified trimester: Secondary | ICD-10-CM

## 2013-10-27 DIAGNOSIS — R1032 Left lower quadrant pain: Secondary | ICD-10-CM | POA: Insufficient documentation

## 2013-10-27 DIAGNOSIS — R197 Diarrhea, unspecified: Secondary | ICD-10-CM | POA: Insufficient documentation

## 2013-10-27 DIAGNOSIS — E039 Hypothyroidism, unspecified: Secondary | ICD-10-CM | POA: Insufficient documentation

## 2013-10-27 DIAGNOSIS — R350 Frequency of micturition: Secondary | ICD-10-CM | POA: Insufficient documentation

## 2013-10-27 DIAGNOSIS — R109 Unspecified abdominal pain: Secondary | ICD-10-CM

## 2013-10-27 LAB — URINALYSIS, ROUTINE W REFLEX MICROSCOPIC
Bilirubin Urine: NEGATIVE
Hgb urine dipstick: NEGATIVE
Ketones, ur: NEGATIVE mg/dL
Protein, ur: NEGATIVE mg/dL
Urobilinogen, UA: 1 mg/dL (ref 0.0–1.0)

## 2013-10-27 LAB — URINE MICROSCOPIC-ADD ON

## 2013-10-27 LAB — WET PREP, GENITAL: Clue Cells Wet Prep HPF POC: NONE SEEN

## 2013-10-27 NOTE — ED Notes (Signed)
Patient transported to Ultrasound 

## 2013-10-27 NOTE — ED Notes (Signed)
Per pt, abdominal cramping for 1 week.  Nausea with no vomiting this week.  No diarrhea or fever.  No change in appetite.  Constipated a few weeks ago and diarrhea a few days ago but nothing in last few days.  Urinating frequently for several weeks.

## 2013-10-27 NOTE — ED Provider Notes (Signed)
CSN: [id number]     Arrival date & time 10/27/13  2003 History   First MD Initiated Contact with Patient 10/27/13 2029     Chief Complaint  Patient presents with  . Abdominal Pain   (Consider location/radiation/quality/duration/timing/severity/associated sxs/prior Treatment) HPI Comments: Patient is a 37 year old G34P1011 female with a past medical history of hypothyroidism and seizures who presents to the emergency department complaining of "pregnancy symptoms" x5 weeks. Last menstrual period was in the beginning of October, states she is about 2 weeks late. Home pregnancy tests have been negative. 5 weeks ago she had nausea with a few episodes of vomiting. Over the past week she has had lower abdominal cramping. Denies vaginal bleeding or discharge. Admits to increased urinary frequency x2-3 weeks without dysuria or hematuria. Denies fever, chills, appetite change. She had 2 episodes of non-bloody diarrhea about 3 days ago, this has not returned. She and her husband are trying to get pregnant.   Patient is a 37 y.o. female presenting with abdominal pain. The history is provided by the patient.  Abdominal Pain Associated symptoms: diarrhea (subsided), nausea and vomiting (subsided)     Past Medical History  Diagnosis Date  . Hypothyroidism   . Seizures     last seizure 08/11/13   Past Surgical History  Procedure Laterality Date  . Breast reduction surgery    . Ganglion cyst excision     Family History  Problem Relation Age of Onset  . Lung cancer Maternal Grandmother   . Colon cancer Maternal Grandmother   . Cancer Other   . Diabetes Other   . High blood pressure Other    History  Substance Use Topics  . Smoking status: Former Games developer  . Smokeless tobacco: Never Used  . Alcohol Use: No   OB History   Grav Para Term Preterm Abortions TAB SAB Ect Mult Living   2 1 1       1      Review of Systems  Gastrointestinal: Positive for nausea, vomiting (subsided), abdominal pain and  diarrhea (subsided).  Genitourinary: Positive for frequency.  All other systems reviewed and are negative.    Allergies  Other; Cocoa butter; and Codeine  Home Medications   Current Outpatient Rx  Name  Route  Sig  Dispense  Refill  . Prenatal Vit-Fe Fumarate-FA (PRENATAL MULTIVITAMIN) TABS tablet   Oral   Take 1 tablet by mouth every morning.         . Thyroid 48.75 MG TABS   Oral   Take 48.75 mg by mouth every morning.           BP 125/71  Pulse 96  Temp(Src) 98.3 F (36.8 C) (Oral)  Resp 18  SpO2 99%  LMP 09/17/2013  Breastfeeding? Unknown Physical Exam  Nursing note and vitals reviewed. Constitutional: She is oriented to person, place, and time. She appears well-developed and well-nourished. No distress.  Obese  HENT:  Head: Normocephalic and atraumatic.  Mouth/Throat: Oropharynx is clear and moist.  Eyes: Conjunctivae are normal.  Neck: Normal range of motion. Neck supple.  Cardiovascular: Normal rate, regular rhythm and normal heart sounds.   Pulmonary/Chest: Effort normal and breath sounds normal.  Abdominal: Soft. Bowel sounds are normal. She exhibits no distension. There is tenderness in the suprapubic area and left lower quadrant. There is no rigidity, no rebound and no guarding.  No peritoneal signs.  Genitourinary: Cervix exhibits no motion tenderness, no discharge and no friability. Left adnexum displays tenderness. No erythema, tenderness or  bleeding around the vagina. No vaginal discharge found.  Exam limited by patient's body habitus.  Musculoskeletal: Normal range of motion. She exhibits no edema.  Neurological: She is alert and oriented to person, place, and time.  Skin: Skin is warm and dry. She is not diaphoretic.  Psychiatric: She has a normal mood and affect. Her behavior is normal.    ED Course  Procedures (including critical care time) Labs Review Labs Reviewed  WET PREP, GENITAL - Abnormal; Notable for the following:    WBC, Wet  Prep HPF POC MANY (*)    All other components within normal limits  URINALYSIS, ROUTINE W REFLEX MICROSCOPIC - Abnormal; Notable for the following:    APPearance CLOUDY (*)    Specific Gravity, Urine 1.031 (*)    Leukocytes, UA SMALL (*)    All other components within normal limits  PREGNANCY, URINE - Abnormal; Notable for the following:    Preg Test, Ur POSITIVE (*)    All other components within normal limits  URINE MICROSCOPIC-ADD ON - Abnormal; Notable for the following:    Squamous Epithelial / LPF FEW (*)    Bacteria, UA FEW (*)    Crystals CA OXALATE CRYSTALS (*)    All other components within normal limits  GC/CHLAMYDIA PROBE AMP  URINE CULTURE   Imaging Review US Ob Comp Less 14 Wks  10/27/2013   CLINICAL DATA:  Early pregnancy. Cramping. No quantitative beta HCG is available.  EXAM: OBSTETRIC <14 WK [name] AND TRANSVAGINAL OB US  TECHNIQUE: Both transabdominal and transvaginal ultrasound examinations were performed for complete evaluation of the gestation as well as the maternal uterus, adnexal regions, and pelvic cul-de-sac. Transvaginal technique was performed to assess early pregnancy.  COMPARISON:  07/28/2013  FINDINGS: Intrauterine gestational sac: Visualized/normal in shape.  Yolk sac:  Present  Embryo:  Not visualized  Cardiac Activity: Not visualized  Heart Rate:  Not applicable bpm  MSD:  4.7  mm   5 w   2  d  CRL:     mm    w  d                  [name] EDC: 06/27/2014  Maternal uterus/adnexae:  Right ovary measures 3.4 x 2.1 x 3.4 cm, and contains a 1.3 cm complex lesion which may represent corpus luteum of pregnancy or hemorrhagic cyst.  Left ovary measures 3.0 x 1.1 x 1.3 cm and appears normal.  Trace amount of free pelvic fluid.  Cervical nabothian cyst.  IMPRESSION: 1. Intrauterine gestational sac measuring at 5 weeks 2 days gestation, appears to contain a yolk sac but the embryo is not yet well visualized. Careful correlation with quantitative beta HCG trend and a low threshold  for reimaging is suggested. 2. Complex 1.3 cm right ovarian lesion, likely a corpus luteum of pregnancy or hemorrhagic cyst.   Electronically Signed   By: Herbie Baltimore M.D.   On: 10/27/2013 23:07   US Ob Transvaginal  10/27/2013   CLINICAL DATA:  Early pregnancy. Cramping. No quantitative beta HCG is available.  EXAM: OBSTETRIC <14 WK [name] AND TRANSVAGINAL OB US  TECHNIQUE: Both transabdominal and transvaginal ultrasound examinations were performed for complete evaluation of the gestation as well as the maternal uterus, adnexal regions, and pelvic cul-de-sac. Transvaginal technique was performed to assess early pregnancy.  COMPARISON:  07/28/2013  FINDINGS: Intrauterine gestational sac: Visualized/normal in shape.  Yolk sac:  Present  Embryo:  Not visualized  Cardiac Activity: Not visualized  Heart Rate:  Not applicable bpm  MSD:  4.7  mm   5 w   2  d  CRL:     mm    w  d                  [name] EDC: 06/27/2014  Maternal uterus/adnexae:  Right ovary measures 3.4 x 2.1 x 3.4 cm, and contains a 1.3 cm complex lesion which may represent corpus luteum of pregnancy or hemorrhagic cyst.  Left ovary measures 3.0 x 1.1 x 1.3 cm and appears normal.  Trace amount of free pelvic fluid.  Cervical nabothian cyst.  IMPRESSION: 1. Intrauterine gestational sac measuring at 5 weeks 2 days gestation, appears to contain a yolk sac but the embryo is not yet well visualized. Careful correlation with quantitative beta HCG trend and a low threshold for reimaging is suggested. 2. Complex 1.3 cm right ovarian lesion, likely a corpus luteum of pregnancy or hemorrhagic cyst.   Electronically Signed   By: Herbie Baltimore M.D.   On: 10/27/2013 23:07    EKG Interpretation   None       MDM   1. Pregnancy   2. Abdominal pain    Patient with abdominal cramping, increased urinary frequency, trying to get pregnant. She is well appearing and in NAD, no acute abdomen. Pregnancy test positive. L adnexal tenderness on pelvic exam. [name]  does not show tubal pregnancy. US showing intrauterine gestational sac measuring 5 weeks 2 days, yolk sac visualized but embryo not well visualized. She is already taking prenatal vitamins. Has an ob/gyn who she will f/u with. No vaginal bleeding. Return precautions discussed, [facility] symptoms or vaginal bleeding. Patient states understanding of treatment care plan and is agreeable. Stable for discharge.    Trevor Mace, PA-C 10/27/13 2321

## 2013-10-28 LAB — GC/CHLAMYDIA PROBE AMP
CT Probe RNA: NEGATIVE
GC Probe RNA: NEGATIVE

## 2013-10-28 NOTE — ED Provider Notes (Signed)
Medical screening examination/treatment/procedure(s) were performed by non-physician practitioner and as supervising physician I was immediately available for consultation/collaboration.  EKG Interpretation   None         Candyce Churn, MD 10/28/13 [phone]

## 2013-10-29 LAB — URINE CULTURE

## 2013-10-30 ENCOUNTER — Other Ambulatory Visit: Payer: BC Managed Care – PPO

## 2013-11-04 ENCOUNTER — Ambulatory Visit: Payer: BC Managed Care – PPO | Admitting: Cardiology

## 2013-11-14 ENCOUNTER — Inpatient Hospital Stay (HOSPITAL_COMMUNITY): Payer: PRIVATE HEALTH INSURANCE

## 2013-11-14 ENCOUNTER — Encounter (HOSPITAL_COMMUNITY): Payer: Self-pay

## 2013-11-14 ENCOUNTER — Inpatient Hospital Stay (HOSPITAL_COMMUNITY)
Admission: AD | Admit: 2013-11-14 | Discharge: 2013-11-14 | Disposition: A | Discharge status: Home / Self Care | Payer: Self-pay | Source: Ambulatory Visit | Attending: Obstetrics and Gynecology | Admitting: Obstetrics and Gynecology

## 2013-11-14 DIAGNOSIS — O039 Complete or unspecified spontaneous abortion without complication: Secondary | ICD-10-CM

## 2013-11-14 DIAGNOSIS — O021 Missed abortion: Secondary | ICD-10-CM | POA: Insufficient documentation

## 2013-11-14 HISTORY — DX: Other specified postprocedural states: Z98.890

## 2013-11-14 HISTORY — DX: Nausea with vomiting, unspecified: R11.2

## 2013-11-14 LAB — CBC
Hemoglobin: 12.2 g/dL (ref 12.0–15.0)
MCH: 27.6 pg (ref 26.0–34.0)
MCV: 83.7 fL (ref 78.0–100.0)
Platelets: 237 10*3/uL (ref 150–400)
RBC: 4.42 MIL/uL (ref 3.87–5.11)
WBC: 8.5 10*3/uL (ref 4.0–10.5)

## 2013-11-14 LAB — URINE MICROSCOPIC-ADD ON

## 2013-11-14 LAB — URINALYSIS, ROUTINE W REFLEX MICROSCOPIC
Bilirubin Urine: NEGATIVE
Ketones, ur: NEGATIVE mg/dL
Leukocytes, UA: NEGATIVE
Nitrite: NEGATIVE
Protein, ur: NEGATIVE mg/dL
Specific Gravity, Urine: >1.03 — ABNORMAL HIGH (ref 1.005–1.030)
Urobilinogen, UA: 0.2 mg/dL (ref 0.0–1.0)

## 2013-11-14 MED ORDER — MISOPROSTOL 200 MCG PO TABS: ORAL_TABLET | ORAL | Status: DC

## 2013-11-14 MED ORDER — IBUPROFEN 800 MG PO TABS: 800.0000 mg | ORAL_TABLET | Freq: Three times a day (TID) | ORAL | Status: DC

## 2013-11-14 NOTE — MAU Provider Note (Signed)
History     CSN: [id number]  Arrival date and time: 11/14/13 1549   None     Chief Complaint  Patient presents with  . Vaginal Bleeding   HPI [name] is a 37 y.o. G3P1011 at [redacted]w[redacted]d who presents to MAU today with complaint of vaginal bleeding since this morning. The patient states that she had occasional very light brown spotting prior to today. Earlier today she noted bright red blood when she wiped. She denies significant bleeding now. She denies abdominal pain, dizziness, weakness, fatigue, fever or abnormal discharge. She states last intercourse was 4 days ago. She plans to [facility] at the [facility] on Monday.   OB History   Grav Para Term Preterm Abortions TAB SAB Ect Mult Living   3 1 1  1  1   1       Past Medical History  Diagnosis Date  . Hypothyroidism   . Seizures     last seizure 08/11/13  . PONV (postoperative nausea and vomiting)     Past Surgical History  Procedure Laterality Date  . Breast reduction surgery    . Ganglion cyst excision      Family History  Problem Relation Age of Onset  . Lung cancer Maternal Grandmother   . Colon cancer Maternal Grandmother   . Cancer Other   . Diabetes Other   . High blood pressure Other     History  Substance Use Topics  . Smoking status: Former Games developer  . Smokeless tobacco: Never Used  . Alcohol Use: No    Allergies:  Allergies  Allergen Reactions  . Other Anaphylaxis and Itching    cantaloupe  . Cocoa Butter Hives  . Codeine Other (See Comments)    seizures    Prescriptions prior to admission  Medication Sig Dispense Refill  . Prenatal Vit-Fe Fumarate-FA (PRENATAL MULTIVITAMIN) TABS tablet Take 1 tablet by mouth at bedtime.       . Thyroid 48.75 MG TABS Take 48.75 mg by mouth every morning.         Review of Systems  Constitutional: Negative for fever and malaise/fatigue.  Gastrointestinal: Negative for nausea, vomiting, abdominal pain, diarrhea and constipation.  Genitourinary: Negative  for dysuria, urgency and frequency.       + vaginal bleeding Neg - vaginal discharge  Neurological: Negative for dizziness, loss of consciousness and weakness.   Physical Exam   Blood pressure 135/76, pulse 91, temperature 98.3 F (36.8 C), temperature source Oral, resp. rate 20, height 5\' 4"  (1.626 m), weight 260 lb 12.8 oz (118.298 kg), last menstrual period 09/17/2013, SpO2 100.00%.  Physical Exam  Constitutional: She is oriented to person, place, and time. She appears well-developed and well-nourished. No distress.  HENT:  Head: Normocephalic and atraumatic.  Cardiovascular: Normal rate.   Respiratory: Effort normal.  Genitourinary: There is bleeding (small amount of dark blood noted in the vagina) around the vagina. No vaginal discharge found.  Neurological: She is alert and oriented to person, place, and time.  Skin: Skin is warm and dry. No erythema.  Psychiatric: She has a normal mood and affect.   Results for orders placed during the hospital encounter of 11/14/13 (from the past 24 hour(s))  URINALYSIS, ROUTINE W REFLEX MICROSCOPIC     Status: Abnormal   Collection Time    11/14/13  4:00 PM      Result Value Range   Color, Urine YELLOW  YELLOW   APPearance CLEAR  CLEAR   Specific  Gravity, Urine >1.030 (*) 1.005 - 1.030   pH 6.0  5.0 - 8.0   Glucose, UA NEGATIVE  NEGATIVE mg/dL   Hgb urine dipstick LARGE (*) NEGATIVE   Bilirubin Urine NEGATIVE  NEGATIVE   Ketones, ur NEGATIVE  NEGATIVE mg/dL   Protein, ur NEGATIVE  NEGATIVE mg/dL   Urobilinogen, UA 0.2  0.0 - 1.0 mg/dL   Nitrite NEGATIVE  NEGATIVE   Leukocytes, UA NEGATIVE  NEGATIVE  URINE MICROSCOPIC-ADD ON     Status: Abnormal   Collection Time    11/14/13  4:00 PM      Result Value Range   Squamous Epithelial / LPF FEW (*) RARE   WBC, UA 0-2  <3 WBC/hpf   RBC / HPF 3-6  <3 RBC/hpf   Bacteria, UA FEW (*) RARE   Urine-Other MUCOUS PRESENT    CBC     Status: None   Collection Time    11/14/13  6:36 PM       Result Value Range   WBC 8.5  4.0 - 10.5 K/uL   RBC 4.42  3.87 - 5.11 MIL/uL   Hemoglobin 12.2  12.0 - 15.0 g/dL   HCT 16.1  09.6 - 04.5 %   MCV 83.7  78.0 - 100.0 fL   MCH 27.6  26.0 - 34.0 pg   MCHC 33.0  30.0 - 36.0 g/dL   RDW 40.9  81.1 - 91.4 %   Platelets 237  150 - 400 K/uL  HCG, QUANTITATIVE, PREGNANCY     Status: Abnormal   Collection Time    11/14/13  6:36 PM      Result Value Range   hCG, Beta Chain, Quant, [place] [address] (*) <5 mIU/mL    US Ob Transvaginal  11/14/2013   CLINICAL DATA:  Vaginal bleeding. Followup for viability. By LMP, the patient is 8 weeks 2 days. EDC by LMP is 06/24/2014.  EXAM: TRANSVAGINAL OB ULTRASOUND  TECHNIQUE: Transvaginal ultrasound was performed for complete evaluation of the gestation as well as the maternal uterus, adnexal regions, and pelvic cul-de-sac.  COMPARISON:  10/27/2013  FINDINGS: Intrauterine gestational sac: Visualized/normal in shape.  Yolk sac:  Present  Embryo:  Present  Cardiac Activity: Not seen  CRL:   8.8  mm   7 w 0 d  Maternal uterus/adnexae: The ovaries have a normal appearance.  IMPRESSION: 1. Findings consistent with early demise. 2. Findings meet definitive criteria for failed pregnancy. This follows SRU consensus guidelines: Diagnostic Criteria for Nonviable Pregnancy Early in the First Trimester. [facility] [phone].   Electronically Signed   By: Rosalie Gums M.D.   On: 11/14/2013 17:55    MAU Course  Procedures None  MDM Discussed options with patient including expectant management vs Cytotec vs surgery Patient states that she used Cytotec with her last pregnancy and felt that "was very traumatic" Patient discussed with spouse. Would like Rx for Cytotec, but unsure if she will use it or not at this time Patient states that she had a lapse in insurance and cannot return to see Dr. Henderson Cloud at [facility] until at least January CBC and quant hCG ordered Assessment and Plan  A: IUFD at [redacted]w[redacted]d  P: Discharge  home Rx for Cytotec and Ibuprofen 800 mg given to patient/sent to patient's pharmacy Bleeding precautions discussed Patient referred to [facility] clinic for follow-up in ~ 2 weeks Patient may return to MAU as needed or if her condition were to change or worsen  Raynelle Fanning  Donzetta Starch, PA-C  11/14/2013, 7:28 PM

## 2013-11-14 NOTE — MAU Note (Signed)
Patient states she went to the bathroom about 20 minutes ago and had bright red blood on the tissue. No pain.

## 2013-11-15 NOTE — MAU Provider Note (Signed)
Attestation of Attending Supervision of Advanced Practitioner (CNM/NP): Evaluation and management procedures were performed by the Advanced Practitioner under my supervision and collaboration.  I have reviewed the Advanced Practitioner's note and chart, and I agree with the management and plan.  Kemoni Ortega 11/15/2013 8:01 AM   

## 2013-11-20 ENCOUNTER — Ambulatory Visit (INDEPENDENT_AMBULATORY_CARE_PROVIDER_SITE_OTHER): Payer: Self-pay | Admitting: Obstetrics & Gynecology

## 2013-11-20 ENCOUNTER — Encounter: Payer: Self-pay | Admitting: Obstetrics & Gynecology

## 2013-11-20 VITALS — BP 126/80 | HR 85 | Ht 64.0 in | Wt 261.8 lb

## 2013-11-20 DIAGNOSIS — O039 Complete or unspecified spontaneous abortion without complication: Secondary | ICD-10-CM

## 2013-11-20 NOTE — Progress Notes (Signed)
Patient ID: Sharon Benson, female   DOB: 14-Jan-1976, 37 y.o.   MRN: [id number]  Chief Complaint  Patient presents with  . Miscarriage    HPI Sharon Benson is a 37 y.o. female.  [id number] Patient's last menstrual period was 09/17/2013. Diagnosed with fetal demise 12/4 at [facility] by [name], she did not want cytotec, no current bleeding. She would like to have D and C. The procedure and the risk of anesthesia, bleeding, infection, pelvic organ damage were discussed and questions were answered.  HPI  Past Medical History  Diagnosis Date  . Hypothyroidism   . Seizures     last seizure 08/11/13  . PONV (postoperative nausea and vomiting)     Past Surgical History  Procedure Laterality Date  . Breast reduction surgery    . Ganglion cyst excision      Family History  Problem Relation Age of Onset  . Lung cancer Maternal Grandmother   . Colon cancer Maternal Grandmother   . Cancer Other   . Diabetes Other   . High blood pressure Other     Social History History  Substance Use Topics  . Smoking status: Former Games developer  . Smokeless tobacco: Never Used  . Alcohol Use: No    Allergies  Allergen Reactions  . Other Anaphylaxis and Itching    cantaloupe  . Cocoa Butter Hives  . Codeine Other (See Comments)    seizures    Current Outpatient Prescriptions  Medication Sig Dispense Refill  . Thyroid 48.75 MG TABS Take 48.75 mg by mouth every morning.       Marland Kitchen ibuprofen (ADVIL,MOTRIN) 800 MG tablet Take 1 tablet (800 mg total) by mouth 3 (three) times daily.  21 tablet  0  . misoprostol (CYTOTEC) 200 MCG tablet Place 4 tabs (800 mcg) in the vagina once  4 tablet  0  . Prenatal Vit-Fe Fumarate-FA (PRENATAL MULTIVITAMIN) TABS tablet Take 1 tablet by mouth at bedtime.        No current facility-administered medications for this visit.    Review of Systems Review of Systems  Constitutional: Negative for fever.  Genitourinary: Negative for vaginal bleeding, vaginal discharge and pelvic  pain.    Blood pressure 126/80, pulse 85, height 5\' 4"  (1.626 m), weight 261 lb 12.8 oz (118.752 kg), last menstrual period 09/17/2013, unknown if currently breastfeeding.  Physical Exam Physical Exam  Vitals reviewed. Constitutional: She is oriented to person, place, and time. She appears well-developed. No distress.  Neurological: She is alert and oriented to person, place, and time.  Skin: Skin is warm and dry.  Psychiatric: She has a normal mood and affect. Her behavior is normal.    Data Reviewed  CLINICAL DATA: Vaginal bleeding. Followup for viability. By LMP,  the patient is 8 weeks 2 days. EDC by LMP is 06/24/2014.  EXAM:  TRANSVAGINAL OB ULTRASOUND  TECHNIQUE:  Transvaginal ultrasound was performed for complete evaluation of the  gestation as well as the maternal uterus, adnexal regions, and  pelvic cul-de-sac.  COMPARISON: 10/27/2013  FINDINGS:  Intrauterine gestational sac: Visualized/normal in shape.  Yolk sac: Present  Embryo: Present  Cardiac Activity: Not seen  CRL: 8.8 mm 7 w 0 d  Maternal uterus/adnexae: The ovaries have a normal appearance.  IMPRESSION:  1. Findings consistent with early demise.  2. Findings meet definitive criteria for failed pregnancy. This  follows SRU consensus guidelines: Diagnostic Criteria for Nonviable  Pregnancy Early in the First Trimester. Sharon Benson [facility]  [phone].  Assessment    Missed abortion      Plan    Post for Suction D and C.       ARNOLD,JAMES 11/20/2013, 5:19 PM

## 2013-11-21 DIAGNOSIS — O039 Complete or unspecified spontaneous abortion without complication: Secondary | ICD-10-CM | POA: Insufficient documentation

## 2013-11-22 ENCOUNTER — Encounter (HOSPITAL_COMMUNITY): Payer: Self-pay | Admitting: *Deleted

## 2013-11-25 ENCOUNTER — Encounter (HOSPITAL_COMMUNITY)
Admission: AD | Disposition: A | Discharge status: Home / Self Care | Payer: Self-pay | Source: Ambulatory Visit | Attending: Obstetrics & Gynecology

## 2013-11-25 ENCOUNTER — Encounter (HOSPITAL_COMMUNITY): Payer: Self-pay | Admitting: Anesthesiology

## 2013-11-25 ENCOUNTER — Ambulatory Visit (HOSPITAL_COMMUNITY): Payer: PRIVATE HEALTH INSURANCE | Admitting: Anesthesiology

## 2013-11-25 ENCOUNTER — Ambulatory Visit (HOSPITAL_COMMUNITY)
Admission: AD | Admit: 2013-11-25 | Discharge: 2013-11-25 | Disposition: A | Discharge status: Home / Self Care | Payer: Self-pay | Source: Ambulatory Visit | Attending: Obstetrics & Gynecology | Admitting: Obstetrics & Gynecology

## 2013-11-25 DIAGNOSIS — O039 Complete or unspecified spontaneous abortion without complication: Secondary | ICD-10-CM

## 2013-11-25 DIAGNOSIS — O021 Missed abortion: Secondary | ICD-10-CM | POA: Insufficient documentation

## 2013-11-25 HISTORY — PX: DILATION AND EVACUATION: SHX1459

## 2013-11-25 SURGERY — DILATION AND EVACUATION, UTERUS
Anesthesia: General | Site: Vagina

## 2013-11-25 MED ORDER — DEXAMETHASONE SODIUM PHOSPHATE 4 MG/ML IJ SOLN
INTRAMUSCULAR | Status: DC | PRN
  Administered 2013-11-25: 10 mg via INTRAVENOUS

## 2013-11-25 MED ORDER — LIDOCAINE HCL (CARDIAC) 20 MG/ML IV SOLN
INTRAVENOUS | Status: DC | PRN
  Administered 2013-11-25: 70 mg via INTRAVENOUS

## 2013-11-25 MED ORDER — MIDAZOLAM HCL 2 MG/2ML IJ SOLN
INTRAMUSCULAR | Status: AC
  Filled 2013-11-25: qty 2

## 2013-11-25 MED ORDER — ONDANSETRON HCL 4 MG/2ML IJ SOLN
INTRAMUSCULAR | Status: DC | PRN
  Administered 2013-11-25: 4 mg via INTRAVENOUS

## 2013-11-25 MED ORDER — MIDAZOLAM HCL 2 MG/2ML IJ SOLN
INTRAMUSCULAR | Status: DC | PRN
  Administered 2013-11-25: 2 mg via INTRAVENOUS

## 2013-11-25 MED ORDER — BUPIVACAINE HCL (PF) 0.5 % IJ SOLN
INTRAMUSCULAR | Status: AC
  Filled 2013-11-25: qty 30

## 2013-11-25 MED ORDER — LIDOCAINE HCL (CARDIAC) 20 MG/ML IV SOLN
INTRAVENOUS | Status: AC
  Filled 2013-11-25: qty 5

## 2013-11-25 MED ORDER — ACETAMINOPHEN 160 MG/5ML PO SOLN
1000.0000 mg | Freq: Once | ORAL | Status: AC
  Administered 2013-11-25: 1000 mg via ORAL

## 2013-11-25 MED ORDER — DOXYCYCLINE HYCLATE 100 MG IV SOLR
200.0000 mg | INTRAVENOUS | Status: AC
  Administered 2013-11-25: 200 mg via INTRAVENOUS
  Filled 2013-11-25: qty 200

## 2013-11-25 MED ORDER — ONDANSETRON HCL 4 MG/2ML IJ SOLN
INTRAMUSCULAR | Status: AC
  Filled 2013-11-25: qty 2

## 2013-11-25 MED ORDER — PROPOFOL 10 MG/ML IV EMUL
INTRAVENOUS | Status: AC
  Filled 2013-11-25: qty 40

## 2013-11-25 MED ORDER — LACTATED RINGERS IV SOLN: INTRAVENOUS | Status: DC

## 2013-11-25 MED ORDER — FENTANYL CITRATE 0.05 MG/ML IJ SOLN
INTRAMUSCULAR | Status: AC
  Filled 2013-11-25: qty 5

## 2013-11-25 MED ORDER — ACETAMINOPHEN 160 MG/5ML PO SOLN
ORAL | Status: AC
  Filled 2013-11-25: qty 20.3

## 2013-11-25 MED ORDER — FENTANYL CITRATE 0.05 MG/ML IJ SOLN
INTRAMUSCULAR | Status: DC | PRN
  Administered 2013-11-25: 100 ug via INTRAVENOUS

## 2013-11-25 MED ORDER — BUPIVACAINE-EPINEPHRINE 0.5% -1:200000 IJ SOLN
INTRAMUSCULAR | Status: DC | PRN
  Administered 2013-11-25: 10 mL

## 2013-11-25 MED ORDER — PROPOFOL 10 MG/ML IV BOLUS
INTRAVENOUS | Status: DC | PRN
  Administered 2013-11-25: 250 mg via INTRAVENOUS

## 2013-11-25 MED ORDER — LACTATED RINGERS IV SOLN
INTRAVENOUS | Status: DC
  Administered 2013-11-25: 11:00:00 via INTRAVENOUS

## 2013-11-25 MED ORDER — FENTANYL CITRATE 0.05 MG/ML IJ SOLN
INTRAMUSCULAR | Status: AC
  Administered 2013-11-25: 50 ug via INTRAVENOUS
  Filled 2013-11-25: qty 2

## 2013-11-25 MED ORDER — DEXAMETHASONE SODIUM PHOSPHATE 10 MG/ML IJ SOLN
INTRAMUSCULAR | Status: AC
  Filled 2013-11-25: qty 1

## 2013-11-25 MED ORDER — KETOROLAC TROMETHAMINE 30 MG/ML IJ SOLN
INTRAMUSCULAR | Status: AC
  Filled 2013-11-25: qty 1

## 2013-11-25 MED ORDER — OXYCODONE-ACETAMINOPHEN 5-325 MG PO TABS: 1.0000 | ORAL_TABLET | Freq: Four times a day (QID) | ORAL | Status: DC | PRN

## 2013-11-25 MED ORDER — FENTANYL CITRATE 0.05 MG/ML IJ SOLN
25.0000 ug | INTRAMUSCULAR | Status: DC | PRN
  Administered 2013-11-25: 50 ug via INTRAVENOUS

## 2013-11-25 MED ORDER — KETOROLAC TROMETHAMINE 30 MG/ML IJ SOLN
INTRAMUSCULAR | Status: DC | PRN
  Administered 2013-11-25: 30 mg via INTRAVENOUS

## 2013-11-25 SURGICAL SUPPLY — 20 items
CATH ROBINSON RED A/P 16FR (CATHETERS) ×2 IMPLANT
CLOTH BEACON ORANGE TIMEOUT ST (SAFETY) ×2 IMPLANT
DECANTER SPIKE VIAL GLASS SM (MISCELLANEOUS) IMPLANT
GLOVE BIO SURGEON STRL SZ 6.5 (GLOVE) ×2 IMPLANT
GLOVE BIOGEL PI IND STRL 7.0 (GLOVE) ×1 IMPLANT
GLOVE BIOGEL PI INDICATOR 7.0 (GLOVE) ×1
GOWN STRL REIN XL XLG (GOWN DISPOSABLE) ×4 IMPLANT
KIT BERKELEY 1ST TRIMESTER 3/8 (MISCELLANEOUS) IMPLANT
NEEDLE SPNL 22GX3.5 QUINCKE BK (NEEDLE) ×2 IMPLANT
NS IRRIG 1000ML POUR BTL (IV SOLUTION) ×2 IMPLANT
PACK VAGINAL MINOR WOMEN LF (CUSTOM PROCEDURE TRAY) ×2 IMPLANT
PAD OB MATERNITY 4.3X12.25 (PERSONAL CARE ITEMS) ×2 IMPLANT
PAD PREP 24X48 CUFFED NSTRL (MISCELLANEOUS) ×2 IMPLANT
SET BERKELEY SUCTION TUBING (SUCTIONS) ×2 IMPLANT
SYR CONTROL 10ML LL (SYRINGE) IMPLANT
TOWEL OR 17X24 6PK STRL BLUE (TOWEL DISPOSABLE) ×4 IMPLANT
VACURETTE 10 RIGID CVD (CANNULA) IMPLANT
VACURETTE 7MM CVD STRL WRAP (CANNULA) IMPLANT
VACURETTE 8 RIGID CVD (CANNULA) ×1 IMPLANT
VACURETTE 9 RIGID CVD (CANNULA) IMPLANT

## 2013-11-25 NOTE — Anesthesia Postprocedure Evaluation (Signed)
  Anesthesia Post-op Note  Patient: Sharon Benson  Procedure(s) Performed: Procedure(s): DILATATION AND EVACUATION (N/A)  Patient Location: PACU  Anesthesia Type:General  Level of Consciousness: awake, alert  and oriented  Airway and Oxygen Therapy: Patient Spontanous Breathing  Post-op Pain: none  Post-op Assessment: Post-op Vital signs reviewed, Patient's Cardiovascular Status Stable, Respiratory Function Stable, Patent Airway, No signs of Nausea or vomiting and Pain level controlled  Post-op Vital Signs: Reviewed and stable  Complications: No apparent anesthesia complications

## 2013-11-25 NOTE — H&P (Addendum)
Patient ID: Sharon Benson, female DOB: 1976/02/08, 37 y.o. MRN: [id number]  Chief Complaint   Patient presents with   .  Miscarriage   HPI  Sharon Benson is a 37 y.o. female. [id number]  Patient's last menstrual period was 09/17/2013.  Diagnosed with fetal demise 12/4 at [facility] by [name], she did not want cytotec, no current bleeding. She would like to have D and C. The procedure and the risk of anesthesia, bleeding, infection, pelvic organ damage were discussed and questions were answered.  HPI  Past Medical History   Diagnosis  Date   .  Hypothyroidism    .  Seizures      last seizure 08/11/13   .  PONV (postoperative nausea and vomiting)     Past Surgical History   Procedure  Laterality  Date   .  Breast reduction surgery     .  Ganglion cyst excision      Family History   Problem  Relation  Age of Onset   .  Lung cancer  Maternal Grandmother    .  Colon cancer  Maternal Grandmother    .  Cancer  Other    .  Diabetes  Other    .  High blood pressure  Other    Social History  History   Substance Use Topics   .  Smoking status:  Former Games developer   .  Smokeless tobacco:  Never Used   .  Alcohol Use:  No    Allergies   Allergen  Reactions   .  Other  Anaphylaxis and Itching     cantaloupe   .  Cocoa Butter  Hives   .  Codeine  Other (See Comments)     seizures    Current Outpatient Prescriptions   Medication  Sig  Dispense  Refill   .  Thyroid 48.75 MG TABS  Take 48.75 mg by mouth every morning.     Marland Kitchen  ibuprofen (ADVIL,MOTRIN) 800 MG tablet  Take 1 tablet (800 mg total) by mouth 3 (three) times daily.  21 tablet  0   .  misoprostol (CYTOTEC) 200 MCG tablet  Place 4 tabs (800 mcg) in the vagina once  4 tablet  0   .  Prenatal Vit-Fe Fumarate-FA (PRENATAL MULTIVITAMIN) TABS tablet  Take 1 tablet by mouth at bedtime.      No current facility-administered medications for this visit.   Review of Systems  Review of Systems  Constitutional: Negative for fever.  Genitourinary:  Negative for vaginal bleeding, vaginal discharge and pelvic pain.  Blood pressure 126/80, pulse 85, height 5\' 4"  (1.626 m), weight 261 lb 12.8 oz (118.752 kg), last menstrual period 09/17/2013, unknown if currently breastfeeding.  Physical Exam  Physical Exam  Vitals reviewed.  Constitutional: She is oriented to person, place, and time. She appears well-developed. No distress.  Neurological: She is alert and oriented to person, place, and time.  Skin: Skin is warm and dry.  Psychiatric: She has a normal mood and affect. Her behavior is normal.  Data Reviewed  CLINICAL DATA: Vaginal bleeding. Followup for viability. By LMP,  the patient is 8 weeks 2 days. EDC by LMP is 06/24/2014.  EXAM:  TRANSVAGINAL OB ULTRASOUND  TECHNIQUE:  Transvaginal ultrasound was performed for complete evaluation of the  gestation as well as the maternal uterus, adnexal regions, and  pelvic cul-de-sac.  COMPARISON: 10/27/2013  FINDINGS:  Intrauterine gestational sac: Visualized/normal in shape.  Yolk sac: Present  Embryo: Present  Cardiac Activity: Not seen  CRL: 8.8 mm 7 w 0 d  Maternal uterus/adnexae: The ovaries have a normal appearance.  IMPRESSION:  1. Findings consistent with early demise.  2. Findings meet definitive criteria for failed pregnancy. This  follows SRU consensus guidelines: Diagnostic Criteria for Nonviable  Pregnancy Early in the First Trimester. Macy Mis J Med  [phone].  Assessment  Missed abortion  Plan  Post for Suction D and C.  Shey Bartmess  11/20/2013, 5:19 PM No c/o bleeding, the procedure and risk for anesthesia, bleeding , infection, pelvic organ damage explained and questions answered.   Adam Phenix, MD 11/25/2013

## 2013-11-25 NOTE — Transfer of Care (Signed)
Immediate Anesthesia Transfer of Care Note  Patient: Sharon Benson  Procedure(s) Performed: Procedure(s): DILATATION AND EVACUATION (N/A)  Patient Location: PACU  Anesthesia Type:General  Level of Consciousness: awake, alert  and oriented  Airway & Oxygen Therapy: Patient Spontanous Breathing and Patient connected to nasal cannula oxygen  Post-op Assessment: Report given to PACU RN and Post -op Vital signs reviewed and stable  Post vital signs: Reviewed and stable  Complications: No apparent anesthesia complications

## 2013-11-25 NOTE — Anesthesia Preprocedure Evaluation (Signed)
Anesthesia Evaluation  Patient identified by MRN, date of birth, ID band Patient awake    Reviewed: Allergy & Precautions, H&P , Patient's Chart, lab work & pertinent test results, reviewed documented beta blocker date and time   Airway Mallampati: II TM Distance: >3 FB Neck ROM: full    Dental no notable dental hx.    Pulmonary former smoker,  breath sounds clear to auscultation  Pulmonary exam normal       Cardiovascular Rhythm:regular Rate:Normal     Neuro/Psych    GI/Hepatic   Endo/Other  Morbid obesity  Renal/GU      Musculoskeletal   Abdominal   Peds  Hematology   Anesthesia Other Findings   Reproductive/Obstetrics                           Anesthesia Physical Anesthesia Plan  ASA: III  Anesthesia Plan:    Post-op Pain Management:    Induction: Intravenous  Airway Management Planned: LMA  Additional Equipment:   Intra-op Plan:   Post-operative Plan:   Informed Consent: I have reviewed the patients History and Physical, chart, labs and discussed the procedure including the risks, benefits and alternatives for the proposed anesthesia with the patient or authorized representative who has indicated his/her understanding and acceptance.   Dental Advisory Given and Dental advisory given  Plan Discussed with: CRNA and Surgeon  Anesthesia Plan Comments:         Anesthesia Quick Evaluation

## 2013-11-25 NOTE — Op Note (Signed)
Procedure: Suction dilation and curettage Preoperative diagnosis: Missed abortion at [redacted] weeks gestation Postoperative diagnosis: Same Surgeon: Dr. Scheryl Darter Anesthesia: Gen. and intracervical block Specimen: Products of conception Estimated blood loss: Negligible Complications: None Counts: Correct   Patient gave written consent for suction dilation and curettage with a diagnosis of missed abortion at [redacted] weeks gestation. Patient identification was confirmed and she was brought to the OR and general anesthesia was induced. She's placed in dorsal lithotomy position. Perineum and vagina were sterilely prepped and draped. Bladder was drained with red rubber catheter. Exam revealed 7 week size uterus no masses. Cervix was closed. Speculum was inserted and half percent Marcaine was infiltrated for intracervical block. Cervix was grasped with single-tooth tenaculum. Cervix was dilated sufficiently to pass 8 mm suction curette. Suction curettage was performed and products of conception were obtained. Complete evacuation uterine cavity was assured. There was minimal bleeding during the procedure. She received IV Pitocin. All instruments were removed. She was brought in stable condition to PACU.  Dr. Scheryl Darter 11/25/2013 12:40 PM

## 2013-11-26 ENCOUNTER — Telehealth: Payer: Self-pay | Admitting: *Deleted

## 2013-11-26 ENCOUNTER — Encounter (HOSPITAL_COMMUNITY): Payer: Self-pay | Admitting: Obstetrics & Gynecology

## 2013-11-26 NOTE — Telephone Encounter (Signed)
Pt left message stating that she had D&C yesterday and has had no bleeding since the surgery. She wanted to be sure this is normal. I called pt and informed her that this is normal. She may not have any bleeding at all. It is equally possible that she may begin to have some spotting in a Cherika Jessie or so. She should return to the hospital if she has heavy bleeding, severe abdominal pain or fever.  Pt also stated that she has confusion about when she may resume intercourse. She states that she was told to wait 6 wks by a clinic staff member, yet her discharge paperwork after the surgery yesterday states 2 weeks. I told her that she should follow the post op directions she was given yesterday. Pt voiced understanding.

## 2013-11-28 ENCOUNTER — Telehealth: Payer: Self-pay | Admitting: *Deleted

## 2013-11-28 NOTE — Telephone Encounter (Signed)
Spoke with patient requesting information on feeling sore after surgery.  Explained to patient that it is normal to have soreness in neck and arms where her IV was inserted.  Encouraged to take Motrin/Ibuprophen for soreness.  Pt verbalizes understanding.

## 2013-11-29 ENCOUNTER — Encounter (HOSPITAL_COMMUNITY): Payer: Self-pay

## 2013-11-29 ENCOUNTER — Inpatient Hospital Stay (HOSPITAL_COMMUNITY): Payer: PRIVATE HEALTH INSURANCE

## 2013-11-29 ENCOUNTER — Inpatient Hospital Stay (HOSPITAL_COMMUNITY)
Admission: AD | Admit: 2013-11-29 | Discharge: 2013-11-30 | Disposition: A | Discharge status: Home / Self Care | Payer: PRIVATE HEALTH INSURANCE | Source: Ambulatory Visit | Attending: Obstetrics and Gynecology | Admitting: Obstetrics and Gynecology

## 2013-11-29 DIAGNOSIS — O034 Incomplete spontaneous abortion without complication: Secondary | ICD-10-CM

## 2013-11-29 DIAGNOSIS — O021 Missed abortion: Secondary | ICD-10-CM | POA: Insufficient documentation

## 2013-11-29 HISTORY — DX: Benign neoplasm of connective and other soft tissue, unspecified: D21.9

## 2013-11-29 HISTORY — DX: Headache: R51

## 2013-11-29 HISTORY — DX: Endometriosis, unspecified: N80.9

## 2013-11-29 LAB — CBC
Hemoglobin: 11.6 g/dL — ABNORMAL LOW (ref 12.0–15.0)
MCH: 27.6 pg (ref 26.0–34.0)
MCHC: 33.1 g/dL (ref 30.0–36.0)
MCV: 83.3 fL (ref 78.0–100.0)
RDW: 13.8 % (ref 11.5–15.5)

## 2013-11-29 LAB — HCG, QUANTITATIVE, PREGNANCY: hCG, Beta Chain, Quant, S: 4611 m[IU]/mL — ABNORMAL HIGH (ref <5)

## 2013-11-29 MED ORDER — FAMOTIDINE IN NACL 20-0.9 MG/50ML-% IV SOLN
20.0000 mg | Freq: Once | INTRAVENOUS | Status: AC
  Administered 2013-11-30: 20 mg via INTRAVENOUS
  Filled 2013-11-29: qty 50

## 2013-11-29 MED ORDER — KETOROLAC TROMETHAMINE 60 MG/2ML IM SOLN
60.0000 mg | Freq: Once | INTRAMUSCULAR | Status: AC
  Administered 2013-11-29: 60 mg via INTRAMUSCULAR
  Filled 2013-11-29: qty 2

## 2013-11-29 NOTE — [facility] Provider Note (Signed)
History     CSN: [id number]  Arrival date and time: 11/29/13 1749   First Provider Initiated Contact with Patient 11/29/13 1857      Chief Complaint  Patient presents with  . Vaginal Bleeding  . Abdominal Pain   HPI  [name] is a 37 y.o. female [phone] who presents with vaginal bleeding following a D and C she had done on 12/15 for a 7 week fetal demise.  She did not have much bleeding following the procedure, however today around 1330 she was standing up working and felt a gush of blood. She put on a pad, and within and hour the pad was full. Since 1330 she has soaked 2 pads from front to back. She is experiencing pain 10/10 in her lower abdomen.  Pt has a history of endometriosis.   OB History   Grav Para Term Preterm Abortions TAB SAB Ect Mult Living   4 1 1  2  2   1       Past Medical History  Diagnosis Date  . Hypothyroidism   . Seizures     last seizure 08/11/13  . PONV (postoperative nausea and vomiting)   . SVD (spontaneous vaginal delivery)     x 1  . Headache(784.0)   . Endometriosis   . Fibroid     Past Surgical History  Procedure Laterality Date  . Breast reduction surgery    . Ganglion cyst excision      right hand  . Dilation and evacuation N/A 11/25/2013    Procedure: DILATATION AND EVACUATION;  Surgeon: Adam Phenix, MD;  Location: [facility];  Service: Gynecology;  Laterality: N/A;  . Dilation and curettage of uterus      Family History  Problem Relation Age of Onset  . Lung cancer Maternal Grandmother   . Colon cancer Maternal Grandmother   . Cancer Other   . Diabetes Other   . High blood pressure Other   . Diabetes Mother   . Hypertension Father   . Hypertension Paternal Grandmother   . Diabetes Paternal Grandmother   . Heart disease Paternal Grandmother     History  Substance Use Topics  . Smoking status: Former Smoker -- 2 years    Types: Cigarettes  . Smokeless tobacco: Never Used     Comment: smoked in HS  .  Alcohol Use: No    Allergies:  Allergies  Allergen Reactions  . Other Anaphylaxis and Itching    cantaloupe  . Cocoa Butter Hives  . Codeine Other (See Comments)    seizures    Prescriptions prior to admission  Medication Sig Dispense Refill  . ibuprofen (ADVIL,MOTRIN) 800 MG tablet Take 1 tablet (800 mg total) by mouth 3 (three) times daily.  21 tablet  0  . Thyroid 48.75 MG TABS Take 48.75 mg by mouth every morning.        Results for orders placed during the hospital encounter of 11/29/13 (from the past 24 hour(s))  CBC     Status: Abnormal   Collection Time    11/29/13  6:40 PM      Result Value Range   WBC 9.3  4.0 - 10.5 K/uL   RBC 4.20  3.87 - 5.11 MIL/uL   Hemoglobin 11.6 (*) 12.0 - 15.0 g/dL   HCT 29.5 (*) 18.8 - 41.6 %   MCV 83.3  78.0 - 100.0 fL   MCH 27.6  26.0 - 34.0 pg   MCHC 33.1  30.0 - 36.0 g/dL   RDW 16.1  09.6 - 04.5 %   Platelets 202  150 - 400 K/uL    Review of Systems  Constitutional: Positive for malaise/fatigue. Negative for fever and chills.  Gastrointestinal: Positive for abdominal pain. Negative for nausea, vomiting, diarrhea and constipation.       Severe lower-mid abdominal cramping   Genitourinary: Negative for dysuria, urgency, frequency and hematuria.       No vaginal discharge. +vaginal bleeding: heavy, soaked 2 pads in 7 hours.  No dysuria.   Neurological: Negative for dizziness and weakness.   Physical Exam   Blood pressure 131/83, pulse 94, temperature 98.3 F (36.8 C), temperature source Oral, resp. rate 20, last menstrual period 09/17/2013, SpO2 100.00%, unknown if currently breastfeeding.  Physical Exam  Constitutional: She is oriented to person, place, and time. She appears well-developed and well-nourished. No distress.  HENT:  Head: Normocephalic.  Eyes: Pupils are equal, round, and reactive to light.  Neck: Neck supple.  GI: Soft. There is no tenderness.  Genitourinary:   External: no lesion Vagina: moderate amount  of blood in the vagina Cervix: pink, smooth Uterus: NSSC, slightly tender     Neurological: She is alert and oriented to person, place, and time.  Skin: Skin is warm. She is not diaphoretic.    MAU Course  Procedures None  MDM Toradol 60 mg  Pt desires Toradol prior to exam.  Report given to Thressa Sheller CNM who resumes care of the patient.   Results for orders placed during the hospital encounter of 11/29/13 (from the past 24 hour(s))  CBC     Status: Abnormal   Collection Time    11/29/13  6:40 PM      Result Value Range   WBC 9.3  4.0 - 10.5 K/uL   RBC 4.20  3.87 - 5.11 MIL/uL   Hemoglobin 11.6 (*) 12.0 - 15.0 g/dL   HCT 40.9 (*) 81.1 - 91.4 %   MCV 83.3  78.0 - 100.0 fL   MCH 27.6  26.0 - 34.0 pg   MCHC 33.1  30.0 - 36.0 g/dL   RDW 78.2  95.6 - 21.3 %   Platelets 202  150 - 400 K/uL  HCG, QUANTITATIVE, PREGNANCY     Status: Abnormal   Collection Time    11/29/13  6:40 PM      Result Value Range   hCG, Beta Chain, Quant, S 4611 (*) <5 mIU/mL   US Transvaginal Non-ob  11/29/2013   CLINICAL DATA:  Bleeding after D and C.  EXAM: TRANSVAGINAL ULTRASOUND OF PELVIS  TECHNIQUE: Transvaginal ultrasound examination of the pelvis was performed including evaluation of the uterus, ovaries, adnexal regions, and pelvic cul-de-sac.  COMPARISON:  11/14/2013  FINDINGS: The endometrial cavity is distended to 3.7 cm by heterogeneous echogenicity material with internal Doppler flow. Hypoechoic sub serosal fibroid is noted on the uterine fundus, nearly 2 cm in diameter. The ovaries are symmetric and normal in size, 2.2 x 1.7 x 2.8 cm on the right and 2.3 x 1.3 x 2.7 cm on the left. Corpus luteum noted on the right. No significant free pelvic fluid.  IMPRESSION: Positive for retained products of conception.   Electronically Signed   By: Tiburcio Pea M.D.   On: 11/29/2013 22:58   2305: C/W Dr. Emelda Fear, he will come to see the patient.  After discussing with Dr. Emelda Fear, patient would  prefer to proceed with D&C.   Assessment and Plan  Retained  products of conception To OR for D&C   RASCH, JENNIFER IRENE 11/29/2013, 6:57 PM   Sharon Benson  Patient counselled over treatment options, cytotec tx versus repeat d&C with risk, benefits and possible complications of either discussed. Pt remains resistant to Cytotec due to prior difficult Cytotec use with first miscarriage, so will proceed to arepeat d&C this evening. OR notified. Last food 5 pm.  Consent obtained for suction d& C. Tilda Burrow

## 2013-11-29 NOTE — MAU Note (Signed)
Had D&c on Monday 12/15, no bleeding after left hospital.  Today started bleeding cramping and bleeding, now is soaking pads.

## 2013-11-29 NOTE — MAU Note (Signed)
Patient states she had a D & C on 12-15. States she started having heavy vaginal bleeding and abdominal cramping about 1330. Has saturated 2 pads since that time.

## 2013-11-30 ENCOUNTER — Inpatient Hospital Stay (HOSPITAL_COMMUNITY): Payer: PRIVATE HEALTH INSURANCE | Admitting: Certified Registered"

## 2013-11-30 ENCOUNTER — Encounter (HOSPITAL_COMMUNITY)
Admission: AD | Disposition: A | Discharge status: Home / Self Care | Payer: Self-pay | Source: Ambulatory Visit | Attending: Obstetrics and Gynecology

## 2013-11-30 ENCOUNTER — Ambulatory Visit: Admit: 2013-11-30 | Payer: Self-pay | Admitting: Obstetrics and Gynecology

## 2013-11-30 ENCOUNTER — Encounter (HOSPITAL_COMMUNITY): Payer: Self-pay | Admitting: Certified Registered"

## 2013-11-30 DIAGNOSIS — O021 Missed abortion: Secondary | ICD-10-CM

## 2013-11-30 HISTORY — PX: DILATION AND EVACUATION: SHX1459

## 2013-11-30 SURGERY — DILATION AND EVACUATION, UTERUS
Anesthesia: Monitor Anesthesia Care

## 2013-11-30 MED ORDER — PROPOFOL 10 MG/ML IV EMUL
INTRAVENOUS | Status: AC
  Filled 2013-11-30: qty 20

## 2013-11-30 MED ORDER — TRAMADOL HCL 50 MG PO TABS: 50.0000 mg | ORAL_TABLET | Freq: Four times a day (QID) | ORAL | Status: DC | PRN

## 2013-11-30 MED ORDER — ONDANSETRON HCL 4 MG/2ML IJ SOLN
INTRAMUSCULAR | Status: AC
  Filled 2013-11-30: qty 2

## 2013-11-30 MED ORDER — LACTATED RINGERS IV SOLN
INTRAVENOUS | Status: DC | PRN
  Administered 2013-11-30: via INTRAVENOUS

## 2013-11-30 MED ORDER — LIDOCAINE HCL 1 % IJ SOLN
INTRAMUSCULAR | Status: DC | PRN
  Administered 2013-11-30: 20 mL

## 2013-11-30 MED ORDER — LIDOCAINE HCL 1 % IJ SOLN
INTRAMUSCULAR | Status: AC
  Filled 2013-11-30: qty 20

## 2013-11-30 MED ORDER — DEXAMETHASONE SODIUM PHOSPHATE 10 MG/ML IJ SOLN
INTRAMUSCULAR | Status: AC
  Filled 2013-11-30: qty 1

## 2013-11-30 MED ORDER — DEXAMETHASONE SODIUM PHOSPHATE 10 MG/ML IJ SOLN
INTRAMUSCULAR | Status: DC | PRN
  Administered 2013-11-30: 10 mg via INTRAVENOUS

## 2013-11-30 MED ORDER — FENTANYL CITRATE 0.05 MG/ML IJ SOLN
INTRAMUSCULAR | Status: AC
  Filled 2013-11-30: qty 2

## 2013-11-30 MED ORDER — PROPOFOL 10 MG/ML IV BOLUS
INTRAVENOUS | Status: DC | PRN
  Administered 2013-11-30 (×4): 20 mg via INTRAVENOUS
  Administered 2013-11-30: 30 mg via INTRAVENOUS
  Administered 2013-11-30 (×3): 20 mg via INTRAVENOUS

## 2013-11-30 MED ORDER — MIDAZOLAM HCL 2 MG/2ML IJ SOLN
INTRAMUSCULAR | Status: AC
  Filled 2013-11-30: qty 2

## 2013-11-30 MED ORDER — MIDAZOLAM HCL 2 MG/2ML IJ SOLN
INTRAMUSCULAR | Status: DC | PRN
  Administered 2013-11-30: 2 mg via INTRAVENOUS

## 2013-11-30 MED ORDER — KETOROLAC TROMETHAMINE 30 MG/ML IJ SOLN
INTRAMUSCULAR | Status: AC
  Filled 2013-11-30: qty 1

## 2013-11-30 MED ORDER — ONDANSETRON HCL 4 MG/2ML IJ SOLN
INTRAMUSCULAR | Status: DC | PRN
  Administered 2013-11-30: 4 mg via INTRAVENOUS

## 2013-11-30 MED ORDER — LIDOCAINE HCL (CARDIAC) 20 MG/ML IV SOLN
INTRAVENOUS | Status: DC | PRN
  Administered 2013-11-30: 80 mg via INTRAVENOUS

## 2013-11-30 MED ORDER — LIDOCAINE HCL (CARDIAC) 20 MG/ML IV SOLN
INTRAVENOUS | Status: AC
  Filled 2013-11-30: qty 5

## 2013-11-30 MED ORDER — FENTANYL CITRATE 0.05 MG/ML IJ SOLN
INTRAMUSCULAR | Status: DC | PRN
  Administered 2013-11-30 (×2): 50 ug via INTRAVENOUS

## 2013-11-30 SURGICAL SUPPLY — 21 items
CATH ROBINSON RED A/P 16FR (CATHETERS) ×2 IMPLANT
CLOTH BEACON ORANGE TIMEOUT ST (SAFETY) ×2 IMPLANT
DECANTER SPIKE VIAL GLASS SM (MISCELLANEOUS) ×2 IMPLANT
GLOVE ECLIPSE 9.0 STRL (GLOVE) ×4 IMPLANT
GOWN STRL REIN 3XL LVL4 (GOWN DISPOSABLE) ×2 IMPLANT
GOWN STRL REIN XL XLG (GOWN DISPOSABLE) ×4 IMPLANT
KIT BERKELEY 1ST TRIMESTER 3/8 (MISCELLANEOUS) ×2 IMPLANT
NDL SPNL 22GX3.5 QUINCKE BK (NEEDLE) ×1 IMPLANT
NEEDLE SPNL 22GX3.5 QUINCKE BK (NEEDLE) ×2 IMPLANT
NS IRRIG 1000ML POUR BTL (IV SOLUTION) ×2 IMPLANT
PACK VAGINAL MINOR WOMEN LF (CUSTOM PROCEDURE TRAY) ×2 IMPLANT
PAD OB MATERNITY 4.3X12.25 (PERSONAL CARE ITEMS) ×2 IMPLANT
PAD PREP 24X48 CUFFED NSTRL (MISCELLANEOUS) ×2 IMPLANT
SET BERKELEY SUCTION TUBING (SUCTIONS) ×2 IMPLANT
SYR CONTROL 10ML LL (SYRINGE) ×2 IMPLANT
TOWEL OR 17X24 6PK STRL BLUE (TOWEL DISPOSABLE) ×4 IMPLANT
VACURETTE 10 RIGID CVD (CANNULA) IMPLANT
VACURETTE 12 RIGID CVD (CANNULA) IMPLANT
VACURETTE 7MM CVD STRL WRAP (CANNULA) IMPLANT
VACURETTE 8 RIGID CVD (CANNULA) IMPLANT
VACURETTE 9 RIGID CVD (CANNULA) ×2 IMPLANT

## 2013-11-30 NOTE — Preoperative (Signed)
Beta Blockers   Reason not to administer Beta Blockers:Not Applicable 

## 2013-11-30 NOTE — Anesthesia Postprocedure Evaluation (Signed)
Anesthesia Post Note  Patient: Sharon Benson  Procedure(s) Performed: Procedure(s) (LRB): DILATATION AND EVACUATION (N/A)  Anesthesia type: MAC  Patient location: PACU  Post pain: Pain level controlled  Post assessment: Post-op Vital signs reviewed  Last Vitals:  Filed Vitals:   11/30/13 0100  BP:   Pulse: 72  Temp:   Resp: 18    Post vital signs: Reviewed  Level of consciousness: sedated  Complications: No apparent anesthesia complications

## 2013-11-30 NOTE — Brief Op Note (Signed)
11/29/2013 - 11/30/2013  12:42 AM  PATIENT:  Sharon Benson  37 y.o. female  PRE-OPERATIVE DIAGNOSIS:  retained products  POST-OPERATIVE DIAGNOSIS:  retained products  PROCEDURE:  Procedure(s): DILATATION AND EVACUATION (N/A)  SURGEON:  Surgeon(s) and Role:    * Tilda Burrow, MD - Primary  PHYSICIAN ASSISTANT:   ASSISTANTS: none   ANESTHESIA:   local and IV sedation  EBL:     BLOOD ADMINISTERED:none  DRAINS: none   LOCAL MEDICATIONS USED:  LIDOCAINE  and Amount: 20 ml  SPECIMEN:  Source of Specimen:  POC  DISPOSITION OF SPECIMEN:  PATHOLOGY  COUNTS:  YES  TOURNIQUET:  * No tourniquets in log *  DICTATION: .Dragon Dictation  PLAN OF CARE: Discharge to home after PACU  PATIENT DISPOSITION:  PACU - hemodynamically stable.   Delay start of Pharmacological VTE agent (>24hrs) due to surgical blood loss or risk of bleeding: not applicable

## 2013-11-30 NOTE — Anesthesia Preprocedure Evaluation (Signed)
Anesthesia Evaluation    Reviewed: Allergy & Precautions, H&P , NPO status , Patient's Chart, lab work & pertinent test results, reviewed documented beta blocker date and time   Airway Mallampati: III TM Distance: >3 FB Neck ROM: full    Dental no notable dental hx. (+) Teeth Intact   Pulmonary neg pulmonary ROS, former smoker,          Cardiovascular negative cardio ROS      Neuro/Psych negative psych ROS   GI/Hepatic negative GI ROS, Neg liver ROS,   Endo/Other  Morbid obesity  Renal/GU negative Renal ROS     Musculoskeletal negative musculoskeletal ROS (+)   Abdominal (+) + obese,   Peds  Hematology negative hematology ROS (+)   Anesthesia Other Findings   Reproductive/Obstetrics negative OB ROS                           Anesthesia Physical Anesthesia Plan  ASA: III and emergent  Anesthesia Plan: MAC   Post-op Pain Management:    Induction: Intravenous  Airway Management Planned:   Additional Equipment:   Intra-op Plan:   Post-operative Plan:   Informed Consent: I have reviewed the patients History and Physical, chart, labs and discussed the procedure including the risks, benefits and alternatives for the proposed anesthesia with the patient or authorized representative who has indicated his/her understanding and acceptance.     Plan Discussed with: CRNA and Surgeon  Anesthesia Plan Comments:         Anesthesia Quick Evaluation

## 2013-11-30 NOTE — Op Note (Signed)
11/29/2013 - 11/30/2013  12:42 AM  PATIENT:  Oletha Blend  37 y.o. female  PRE-OPERATIVE DIAGNOSIS:  retained products  POST-OPERATIVE DIAGNOSIS:  retained products  PROCEDURE:  Procedure(s): DILATATION AND EVACUATION (N/A)  SURGEON:  Surgeon(s) and Role:    * Tilda Burrow, MD - Primary  PHYSICIAN ASSISTANT:   Details : pt taken to or, time out conducted, prepped, and paracervical block x 20 cc lidicaine infiltrated. The Pt sounded to 8.5 cm, dilated to 27 french in the anteflexed position, and 9 mm suction currette used in circumferential technique x 3 tours of cavity, removing old clots and tissue fragments.  It is unclear how much was still attached. Smooth sharp curettage in all quadrants confirms satisfactory evacuation. No significant bleeding.  Condition to RR: good,.

## 2013-11-30 NOTE — Transfer of Care (Signed)
Immediate Anesthesia Transfer of Care Note  Patient: Sharon Benson  Procedure(s) Performed: Procedure(s): DILATATION AND EVACUATION (N/A)  Patient Location: PACU  Anesthesia Type:MAC  Level of Consciousness: awake, alert  and oriented  Airway & Oxygen Therapy: Patient Spontanous Breathing  Post-op Assessment: Report given to PACU RN, Post -op Vital signs reviewed and stable and Patient moving all extremities  Post vital signs: Reviewed and stable  Complications: No apparent anesthesia complications

## 2013-12-02 ENCOUNTER — Encounter (HOSPITAL_COMMUNITY): Payer: Self-pay | Admitting: Obstetrics and Gynecology

## 2013-12-04 NOTE — Addendum Note (Signed)
Addendum created 12/04/13 1205 by Cristela Blue, MD   Modules edited: Anesthesia Attestations

## 2013-12-10 ENCOUNTER — Encounter: Payer: Self-pay | Admitting: Cardiology

## 2013-12-23 ENCOUNTER — Ambulatory Visit: Payer: BC Managed Care – PPO | Admitting: Diagnostic Neuroimaging

## 2014-02-26 ENCOUNTER — Emergency Department (HOSPITAL_COMMUNITY): Payer: No Typology Code available for payment source

## 2014-02-26 ENCOUNTER — Encounter (HOSPITAL_COMMUNITY): Payer: Self-pay | Admitting: Emergency Medicine

## 2014-02-26 ENCOUNTER — Emergency Department (HOSPITAL_COMMUNITY)
Admission: EM | Admit: 2014-02-26 | Discharge: 2014-02-26 | Disposition: A | Discharge status: Home / Self Care | Payer: No Typology Code available for payment source | Attending: Emergency Medicine | Admitting: Emergency Medicine

## 2014-02-26 DIAGNOSIS — E039 Hypothyroidism, unspecified: Secondary | ICD-10-CM | POA: Insufficient documentation

## 2014-02-26 DIAGNOSIS — Z8669 Personal history of other diseases of the nervous system and sense organs: Secondary | ICD-10-CM | POA: Insufficient documentation

## 2014-02-26 DIAGNOSIS — R002 Palpitations: Secondary | ICD-10-CM | POA: Insufficient documentation

## 2014-02-26 DIAGNOSIS — R0602 Shortness of breath: Secondary | ICD-10-CM | POA: Insufficient documentation

## 2014-02-26 DIAGNOSIS — Z87891 Personal history of nicotine dependence: Secondary | ICD-10-CM | POA: Insufficient documentation

## 2014-02-26 DIAGNOSIS — Z3202 Encounter for pregnancy test, result negative: Secondary | ICD-10-CM | POA: Insufficient documentation

## 2014-02-26 DIAGNOSIS — Z79899 Other long term (current) drug therapy: Secondary | ICD-10-CM | POA: Insufficient documentation

## 2014-02-26 DIAGNOSIS — R0789 Other chest pain: Secondary | ICD-10-CM | POA: Insufficient documentation

## 2014-02-26 DIAGNOSIS — N949 Unspecified condition associated with female genital organs and menstrual cycle: Secondary | ICD-10-CM | POA: Insufficient documentation

## 2014-02-26 LAB — BASIC METABOLIC PANEL WITH GFR
BUN: 17 mg/dL (ref 6–23)
CO2: 29 meq/L (ref 19–32)
Calcium: 9.5 mg/dL (ref 8.4–10.5)
Chloride: 100 meq/L (ref 96–112)
Creatinine, Ser: 0.7 mg/dL (ref 0.50–1.10)
GFR calc Af Amer: >90 mL/min (ref >90)
GFR calc non Af Amer: >90 mL/min (ref >90)
Glucose, Bld: 74 mg/dL (ref 70–99)
Potassium: 4.1 meq/L (ref 3.7–5.3)
Sodium: 140 meq/L (ref 137–147)

## 2014-02-26 LAB — URINALYSIS, ROUTINE W REFLEX MICROSCOPIC
BILIRUBIN URINE: NEGATIVE
GLUCOSE, UA: NEGATIVE mg/dL
Hgb urine dipstick: NEGATIVE
Ketones, ur: NEGATIVE mg/dL
Leukocytes, UA: NEGATIVE
Nitrite: NEGATIVE
PH: 5 (ref 5.0–8.0)
Protein, ur: NEGATIVE mg/dL
SPECIFIC GRAVITY, URINE: 1.029 (ref 1.005–1.030)
Urobilinogen, UA: 0.2 mg/dL (ref 0.0–1.0)

## 2014-02-26 LAB — CBC WITH DIFFERENTIAL/PLATELET
Basophils Absolute: 0 K/uL (ref 0.0–0.1)
Basophils Relative: 0 % (ref 0–1)
Eosinophils Absolute: 0.1 K/uL (ref 0.0–0.7)
Eosinophils Relative: 1 % (ref 0–5)
HCT: 38.3 % (ref 36.0–46.0)
Hemoglobin: 12.6 g/dL (ref 12.0–15.0)
Lymphocytes Relative: 34 % (ref 12–46)
Lymphs Abs: 2.9 K/uL (ref 0.7–4.0)
MCH: 28.4 pg (ref 26.0–34.0)
MCHC: 32.9 g/dL (ref 30.0–36.0)
MCV: 86.5 fL (ref 78.0–100.0)
Monocytes Absolute: 0.6 K/uL (ref 0.1–1.0)
Monocytes Relative: 8 % (ref 3–12)
Neutro Abs: 4.9 K/uL (ref 1.7–7.7)
Neutrophils Relative %: 57 % (ref 43–77)
Platelets: 240 K/uL (ref 150–400)
RBC: 4.43 MIL/uL (ref 3.87–5.11)
RDW: 14 % (ref 11.5–15.5)
WBC: 8.6 K/uL (ref 4.0–10.5)

## 2014-02-26 LAB — HCG, QUANTITATIVE, PREGNANCY: hCG, Beta Chain, Quant, S: <1 m[IU]/mL (ref <5)

## 2014-02-26 LAB — POC URINE PREG, ED: Preg Test, Ur: NEGATIVE

## 2014-02-26 LAB — TROPONIN I: Troponin I: <0.3 ng/mL (ref <0.30)

## 2014-02-26 MED ORDER — FAMOTIDINE 20 MG PO TABS: 20.0000 mg | ORAL_TABLET | Freq: Two times a day (BID) | ORAL | Status: DC

## 2014-02-26 MED ORDER — ALBUTEROL SULFATE HFA 108 (90 BASE) MCG/ACT IN AERS
2.0000 | INHALATION_SPRAY | Freq: Once | RESPIRATORY_TRACT | Status: AC
  Administered 2014-02-26: 2 via RESPIRATORY_TRACT
  Filled 2014-02-26: qty 6.7

## 2014-02-26 NOTE — ED Provider Notes (Signed)
CSN: [id number]     Arrival date & time 02/26/14  1953 History   First MD Initiated Contact with Patient 02/26/14 2015     Chief Complaint  Patient presents with  . Palpitations  . Chest Pain     (Consider location/radiation/quality/duration/timing/severity/associated sxs/prior Treatment) HPI Sharon Benson is a 38 y.o. female who presents emergency department with complaint of chest pain and palpitations. Patient states her symptoms began approximately 8 hours ago. States has been gradually getting worse as the day went on. States she has history of similar pressure in her chest when she had an upper respiratory tract infection. She states she has had palpitations in the past as well and never was diagnosed with anything. Patient also reports some lower abdominal cramping, no urinary symptoms, no vaginal discharge, no vaginal bleeding. Patient believes she may be pregnant. She states she did take a urine pregnancy test at home and is negative. Patient states that her urine pregnancy test never come back positive until 3 months. Patient states she is feeling short of breath. She states that is worsened with laying down flat and with exertion. She denies any prior lung or heart problems. She denies any recent travel or surgeries. She denies any fever, chills, upper respiratory symptoms, cough. She did not try any medications for this prior to coming in.   Past Medical History  Diagnosis Date  . Hypothyroidism   . Seizures     last seizure 08/11/13  . PONV (postoperative nausea and vomiting)   . SVD (spontaneous vaginal delivery)     x 1  . Headache(784.0)   . Endometriosis   . Fibroid    Past Surgical History  Procedure Laterality Date  . Breast reduction surgery    . Ganglion cyst excision      right hand  . Dilation and evacuation N/A 11/25/2013    Procedure: DILATATION AND EVACUATION;  Surgeon: Woodroe Mode, MD;  Location: [facility];  Service: Gynecology;  Laterality: N/A;  .  Dilation and curettage of uterus    . Dilation and evacuation N/A 11/30/2013    Procedure: DILATATION AND EVACUATION;  Surgeon: Jonnie Kind, MD;  Location: [facility];  Service: Gynecology;  Laterality: N/A;   Family History  Problem Relation Age of Onset  . Lung cancer Maternal Grandmother   . Colon cancer Maternal Grandmother   . Cancer Other   . Diabetes Other   . High blood pressure Other   . Diabetes Mother   . Hypertension Father   . Hypertension Paternal Grandmother   . Diabetes Paternal Grandmother   . Heart disease Paternal Grandmother    History  Substance Use Topics  . Smoking status: Former Smoker -- 2 years    Types: Cigarettes  . Smokeless tobacco: Never Used     Comment: smoked in HS  . Alcohol Use: No   OB History   Grav Para Term Preterm Abortions TAB SAB Ect Mult Living   4 1 1  2  2   1      Review of Systems  Constitutional: Negative for fever and chills.  Respiratory: Positive for chest tightness and shortness of breath. Negative for cough.   Cardiovascular: Positive for chest pain. Negative for palpitations and leg swelling.  Gastrointestinal: Negative for nausea, vomiting, abdominal pain and diarrhea.  Genitourinary: Positive for pelvic pain. Negative for dysuria, flank pain, vaginal bleeding, vaginal discharge and vaginal pain.  Musculoskeletal: Negative for arthralgias, myalgias, neck pain and neck stiffness.  Skin: Negative for rash.  Neurological: Negative for dizziness, weakness and headaches.  All other systems reviewed and are negative.      Allergies  Other; Cocoa butter; and Codeine  Home Medications   Current Outpatient Rx  Name  Route  Sig  Dispense  Refill  . acetaminophen (TYLENOL) 325 MG tablet   Oral   Take 325 mg by mouth every 6 (six) hours as needed (pain).         . Prenatal Vit-Fe Fumarate-FA (MULTIVITAMIN-PRENATAL) 27-0.8 MG TABS tablet   Oral   Take 1 tablet by mouth daily at 12 noon.         . Thyroid 48.75  MG TABS   Oral   Take 48.75 mg by mouth every morning.          . Vitamin D, Ergocalciferol, (DRISDOL) 50000 UNITS CAPS capsule   Oral   Take 50,000 Units by mouth daily.          BP 113/78  Pulse 86  Temp(Src) 98.1 F (36.7 C) (Oral)  Resp 16  SpO2 100%  LMP 01/12/2014 Physical Exam  Nursing note and vitals reviewed. Constitutional: She appears well-developed and well-nourished. No distress.  HENT:  Head: Normocephalic.  Eyes: Conjunctivae are normal.  Neck: Neck supple.  Cardiovascular: Normal rate, regular rhythm and normal heart sounds.   Pulmonary/Chest: Effort normal and breath sounds normal. No respiratory distress. She has no wheezes. She has no rales.  Abdominal: Soft. Bowel sounds are normal. She exhibits no distension. There is no tenderness. There is no rebound and no guarding.  Musculoskeletal: She exhibits no edema.  Neurological: She is alert.  Skin: Skin is warm and dry.  Psychiatric: She has a normal mood and affect. Her behavior is normal.    ED Course  Procedures (including critical care time) Labs Review Labs Reviewed  URINALYSIS, ROUTINE W REFLEX MICROSCOPIC  CBC WITH DIFFERENTIAL  BASIC METABOLIC PANEL  HCG, QUANTITATIVE, PREGNANCY  TROPONIN I  POC URINE PREG, ED   Imaging Review Dg Chest 2 View  02/26/2014   CLINICAL DATA:  Chest pain.  EXAM: CHEST  2 VIEW  COMPARISON:  PA and lateral chest 04/13/2012.  FINDINGS: Heart size and mediastinal contours are within normal limits. Both lungs are clear. Visualized skeletal structures are unremarkable.  IMPRESSION: Negative exam.   Electronically Signed   By: Inge Rise M.D.   On: 02/26/2014 22:30      Date: 02/26/2014  Rate: 78  Rhythm: normal sinus rhythm  QRS Axis: normal  Intervals: normal  ST/T Wave abnormalities: normal  Conduction Disutrbances:none  Narrative Interpretation: low voltage precordial leads  Old EKG Reviewed: none available   MDM   Final diagnoses:  Chest pain   Shortness of breath    Patient in emergency department complaining of chest pressure palpitations. She'll states she thinks she may be pregnant. Patient's vital signs are normal. Her exam is normal as well. Will get lab work, chest x-ray, troponin, EKG.  EKG unremarkable, chest x-ray is negative, lab work is all normal including electrolytes, blood counts, troponin x1, Hgb Quant is negative. I have explained to patient that it does not appear that she's pregnant. Patient states that her hCG quantitative is always negative she's pregnant. Although I think this is very unlikely, instructed patient to recheck her pregnancy test in one to 2 weeks if she still thinks that she's pregnant. At this time, patient's  PERC negative, I do not think she has a PE. She  is low risk for ACS with negative troponin and EKG. Her lungs are clear, no pneumonia or any other findings on chest x-ray. Will try inhaler and Pepcid for her symptoms. Instructed to followup with a primary care Dr.  Danley Danker Vitals:   02/26/14 2001 02/26/14 2011 02/26/14 2313  BP: 113/78  122/71  Pulse: 86  78  Temp:  98.1 F (36.7 C)   TempSrc:  Oral   Resp: 16  18  SpO2: 100%  100%      Florene Route Rhiley Tarver, PA-C 02/27/14 0019

## 2014-02-26 NOTE — Discharge Instructions (Signed)
Your lab work, chest x-ray are normal. Start using inhaler 2 puffs every 4 hrs for shortness of breath. Start taking pepcid as prescribed. Try to avoid stressors. Please follow up with primary care doctor for recheck. Return if worsening symptoms.    Chest Pain (Nonspecific) Chest pain has many causes. Your pain could be caused by something serious, such as a heart attack or a blood clot in the lungs. It could also be caused by something less serious, such as a chest bruise or a virus. Follow up with your doctor. More lab tests or other studies may be needed to find the cause of your pain. Most of the time, nonspecific chest pain will improve within 2 to 3 days of rest and mild pain medicine. HOME CARE  For chest bruises, you may put ice on the sore area for 15-20 minutes, 03-04 times a day. Do this only if it makes you feel better.  Put ice in a plastic bag.  Place a towel between the skin and the bag.  Rest for the next 2 to 3 days.  Go back to work if the pain improves.  See your doctor if the pain lasts longer than 1 to 2 weeks.  Only take medicine as told by your doctor.  Quit smoking if you smoke. GET HELP RIGHT AWAY IF:   There is more pain or pain that spreads to the arm, neck, jaw, back, or belly (abdomen).  You have shortness of breath.  You cough more than usual or cough up blood.  You have very bad back or belly pain, feel sick to your stomach (nauseous), or throw up (vomit).  You have very bad weakness.  You pass out (faint).  You have a fever. Any of these problems may be serious and may be an emergency. Do not wait to see if the problems will go away. Get medical help right away. Call your local emergency services 911 in [place] Do not drive yourself to the hospital. MAKE SURE YOU:   Understand these instructions.  Will watch this condition.  Will get help right away if you or your child is not doing well or gets worse. Document Released: 05/16/2008 Document  Revised: 02/20/2012 Document Reviewed: 05/16/2008 [facility] Patient Information 2014 [place], [place].

## 2014-02-26 NOTE — ED Notes (Signed)
Pt states she has a heaviness in her chest  States it is uncomfortable but not painful  Pt states it started today around 1pm  Pt states when she lays down she has palpitations  Pt states she thinks she also may be pregnant  Pt states her last normal period was on 01-12-14

## 2014-02-28 NOTE — ED Provider Notes (Signed)
Medical screening examination/treatment/procedure(s) were performed by non-physician practitioner and as supervising physician I was immediately available for consultation/collaboration.   EKG Interpretation   Date/Time:  Wednesday February 26 2014 20:09:52 EDT Ventricular Rate:  78 PR Interval:  151 QRS Duration: 92 QT Interval:  370 QTC Calculation: 421 R Axis:   77 Text Interpretation:  Sinus rhythm Low voltage, precordial leads RSR' in  V1 or V2, right VCD or RVH ED PHYSICIAN INTERPRETATION AVAILABLE IN [facility] Confirmed by TEST, Record ([id number]) on 02/28/2014 7:09:32 AM        Alfonzo Feller, DO 02/28/14 1441

## 2014-03-27 ENCOUNTER — Emergency Department (HOSPITAL_COMMUNITY)
Admission: EM | Admit: 2014-03-27 | Discharge: 2014-03-28 | Disposition: A | Discharge status: Home / Self Care | Payer: No Typology Code available for payment source | Attending: Emergency Medicine | Admitting: Emergency Medicine

## 2014-03-27 ENCOUNTER — Encounter (HOSPITAL_COMMUNITY): Payer: Self-pay | Admitting: Emergency Medicine

## 2014-03-27 DIAGNOSIS — Z3202 Encounter for pregnancy test, result negative: Secondary | ICD-10-CM | POA: Insufficient documentation

## 2014-03-27 DIAGNOSIS — K2990 Gastroduodenitis, unspecified, without bleeding: Principal | ICD-10-CM

## 2014-03-27 DIAGNOSIS — E669 Obesity, unspecified: Secondary | ICD-10-CM | POA: Insufficient documentation

## 2014-03-27 DIAGNOSIS — R0602 Shortness of breath: Secondary | ICD-10-CM | POA: Insufficient documentation

## 2014-03-27 DIAGNOSIS — K2970 Gastritis, unspecified, without bleeding: Secondary | ICD-10-CM | POA: Insufficient documentation

## 2014-03-27 DIAGNOSIS — Z79899 Other long term (current) drug therapy: Secondary | ICD-10-CM | POA: Insufficient documentation

## 2014-03-27 DIAGNOSIS — Z87891 Personal history of nicotine dependence: Secondary | ICD-10-CM | POA: Insufficient documentation

## 2014-03-27 DIAGNOSIS — R05 Cough: Secondary | ICD-10-CM | POA: Insufficient documentation

## 2014-03-27 DIAGNOSIS — Z8669 Personal history of other diseases of the nervous system and sense organs: Secondary | ICD-10-CM | POA: Insufficient documentation

## 2014-03-27 DIAGNOSIS — R059 Cough, unspecified: Secondary | ICD-10-CM | POA: Insufficient documentation

## 2014-03-27 DIAGNOSIS — Z8742 Personal history of other diseases of the female genital tract: Secondary | ICD-10-CM | POA: Insufficient documentation

## 2014-03-27 MED ORDER — ONDANSETRON HCL 4 MG/2ML IJ SOLN
4.0000 mg | Freq: Once | INTRAMUSCULAR | Status: AC
  Administered 2014-03-28: 4 mg via INTRAVENOUS
  Filled 2014-03-27: qty 2

## 2014-03-27 NOTE — ED Notes (Signed)
Pt reports upper mid abdominal tightness that has been going on for two days. Tightness increased after eating along with feeling short of breath. Pt reports one episode of vomiting. Pt denies any diarrhea or fevers. Pt alert and ambulatory to triage area.

## 2014-03-28 ENCOUNTER — Emergency Department (HOSPITAL_COMMUNITY): Payer: No Typology Code available for payment source

## 2014-03-28 LAB — COMPREHENSIVE METABOLIC PANEL
ALK PHOS: 80 U/L (ref 39–117)
ALT: 16 U/L (ref 0–35)
AST: 18 U/L (ref 0–37)
Albumin: 3.4 g/dL — ABNORMAL LOW (ref 3.5–5.2)
BUN: 17 mg/dL (ref 6–23)
CHLORIDE: 100 meq/L (ref 96–112)
CO2: 29 meq/L (ref 19–32)
CREATININE: 0.84 mg/dL (ref 0.50–1.10)
Calcium: 9.6 mg/dL (ref 8.4–10.5)
GFR, EST NON AFRICAN AMERICAN: 89 mL/min — AB (ref >90)
GLUCOSE: 105 mg/dL — AB (ref 70–99)
POTASSIUM: 3.6 meq/L — AB (ref 3.7–5.3)
Sodium: 140 mEq/L (ref 137–147)
Total Protein: 7.5 g/dL (ref 6.0–8.3)

## 2014-03-28 LAB — CBC WITH DIFFERENTIAL/PLATELET
Basophils Absolute: 0 10*3/uL (ref 0.0–0.1)
Basophils Relative: 0 % (ref 0–1)
Eosinophils Absolute: 0.2 10*3/uL (ref 0.0–0.7)
Eosinophils Relative: 2 % (ref 0–5)
HEMATOCRIT: 38.7 % (ref 36.0–46.0)
HEMOGLOBIN: 12.5 g/dL (ref 12.0–15.0)
LYMPHS ABS: 2.5 10*3/uL (ref 0.7–4.0)
Lymphocytes Relative: 34 % (ref 12–46)
MCH: 28.2 pg (ref 26.0–34.0)
MCHC: 32.3 g/dL (ref 30.0–36.0)
MCV: 87.4 fL (ref 78.0–100.0)
MONOS PCT: 7 % (ref 3–12)
Monocytes Absolute: 0.5 10*3/uL (ref 0.1–1.0)
NEUTROS ABS: 4.2 10*3/uL (ref 1.7–7.7)
NEUTROS PCT: 57 % (ref 43–77)
Platelets: 243 10*3/uL (ref 150–400)
RBC: 4.43 MIL/uL (ref 3.87–5.11)
RDW: 13.7 % (ref 11.5–15.5)
WBC: 7.4 10*3/uL (ref 4.0–10.5)

## 2014-03-28 LAB — LIPASE, BLOOD: Lipase: 47 U/L (ref 11–59)

## 2014-03-28 LAB — PREGNANCY, URINE: PREG TEST UR: NEGATIVE

## 2014-03-28 MED ORDER — SUCRALFATE 1 G PO TABS: 1.0000 g | ORAL_TABLET | Freq: Three times a day (TID) | ORAL | Status: DC

## 2014-03-28 MED ORDER — SUCRALFATE 1 G PO TABS
1.0000 g | ORAL_TABLET | Freq: Once | ORAL | Status: AC
  Administered 2014-03-28: 1 g via ORAL
  Filled 2014-03-28: qty 1

## 2014-03-28 MED ORDER — LANSOPRAZOLE 30 MG PO CPDR: 30.0000 mg | DELAYED_RELEASE_CAPSULE | Freq: Every day | ORAL | Status: DC

## 2014-03-28 NOTE — ED Provider Notes (Signed)
CSN: [id number]     Arrival date & time 03/27/14  2329 History   First MD Initiated Contact with Patient 03/28/14 0015     Chief Complaint  Patient presents with  . Abdominal Pain     (Consider location/radiation/quality/duration/timing/severity/associated sxs/prior Treatment) HPI Comments: Patient with Hx of abdominal discomfort for several months now with worsening pain " tightness" like a rubber band around the abdomen with nausea and SOB. Was seen March prescribed Pepcid that she did not fill.  Has Hx 2 spontaneous abortions at 37 weeks. Has had 2 irregular menstral cycles with 1 day of bleeding in each Feburary and March her last home pregnancy test in March was negative but has not preformed one in April. She is actively trying for a successful pregnancy   Patient is a 38 y.o. female presenting with abdominal pain. The history is provided by the patient.  Abdominal Pain Pain location:  Epigastric, LUQ and RUQ Pain quality: squeezing   Pain radiates to:  Does not radiate Pain severity:  Moderate Onset quality:  Gradual Duration:  2 days Timing:  Constant Progression:  Unchanged Chronicity:  Recurrent Context: not alcohol use, not diet changes, not eating, not laxative use and not suspicious food intake   Relieved by:  Nothing Worsened by:  Nothing tried Ineffective treatments:  None tried Associated symptoms: cough, nausea, shortness of breath and vomiting   Associated symptoms: no constipation, no fever, no vaginal bleeding and no vaginal discharge   Risk factors: obesity   Risk factors: no alcohol abuse, no aspirin use and not pregnant     Past Medical History  Diagnosis Date  . Hypothyroidism   . Seizures     last seizure 08/11/13  . PONV (postoperative nausea and vomiting)   . SVD (spontaneous vaginal delivery)     x 1  . Headache(784.0)   . Endometriosis   . Fibroid    Past Surgical History  Procedure Laterality Date  . Breast reduction surgery    . Ganglion  cyst excision      right hand  . Dilation and evacuation N/A 11/25/2013    Procedure: DILATATION AND EVACUATION;  Surgeon: Woodroe Mode, MD;  Location: [facility];  Service: Gynecology;  Laterality: N/A;  . Dilation and curettage of uterus    . Dilation and evacuation N/A 11/30/2013    Procedure: DILATATION AND EVACUATION;  Surgeon: Jonnie Kind, MD;  Location: [facility];  Service: Gynecology;  Laterality: N/A;   Family History  Problem Relation Age of Onset  . Lung cancer Maternal Grandmother   . Colon cancer Maternal Grandmother   . Cancer Other   . Diabetes Other   . High blood pressure Other   . Diabetes Mother   . Hypertension Father   . Hypertension Paternal Grandmother   . Diabetes Paternal Grandmother   . Heart disease Paternal Grandmother    History  Substance Use Topics  . Smoking status: Former Smoker -- 2 years    Types: Cigarettes  . Smokeless tobacco: Never Used     Comment: smoked in HS  . Alcohol Use: No   OB History   Grav Para Term Preterm Abortions TAB SAB Ect Mult Living   4 1 1  2  2   1      Review of Systems  Constitutional: Negative for fever.  Respiratory: Positive for cough and shortness of breath.   Gastrointestinal: Positive for nausea, vomiting and abdominal pain. Negative for constipation and abdominal distention.  Genitourinary: Negative for vaginal bleeding and vaginal discharge.  Skin: Negative for rash.      Allergies  Other; Cocoa butter; and Codeine  Home Medications   Prior to Admission medications   Medication Sig Start Date End Date Taking? Authorizing Provider  acetaminophen (TYLENOL) 325 MG tablet Take 325 mg by mouth every 6 (six) hours as needed (pain).    Historical Provider, MD  famotidine (PEPCID) 20 MG tablet Take 1 tablet (20 mg total) by mouth 2 (two) times daily. 02/26/14   Tatyana A Kirichenko, PA-C  Prenatal Vit-Fe Fumarate-FA (MULTIVITAMIN-PRENATAL) 27-0.8 MG TABS tablet Take 1 tablet by mouth daily at 12 noon.     Historical Provider, MD  Thyroid 48.75 MG TABS Take 48.75 mg by mouth every morning.     Historical Provider, MD  Vitamin D, Ergocalciferol, (DRISDOL) 50000 UNITS CAPS capsule Take 50,000 Units by mouth daily.    Historical Provider, MD   BP 133/90  Pulse 88  Temp(Src) 98.5 F (36.9 C) (Oral)  Resp 20  SpO2 99%  LMP 02/10/2014 Physical Exam  Nursing note and vitals reviewed. Constitutional: She appears well-developed and well-nourished.  HENT:  Head: Normocephalic.  Eyes: Pupils are equal, round, and reactive to light.  Neck: Normal range of motion.  Cardiovascular: Normal rate and regular rhythm.   Pulmonary/Chest: Effort normal.  Abdominal: She exhibits no distension. There is tenderness.  Skin: Skin is warm.    ED Course  Procedures (including critical care time) Labs Review Labs Reviewed  COMPREHENSIVE METABOLIC PANEL - Abnormal; Notable for the following:    Potassium 3.6 (*)    Glucose, Bld 105 (*)    Albumin 3.4 (*)    Total Bilirubin <0.2 (*)    GFR calc non Af Amer 89 (*)    All other components within normal limits  CBC WITH DIFFERENTIAL  LIPASE, BLOOD  PREGNANCY, URINE    Imaging Review US Abdomen Complete  03/28/2014   CLINICAL DATA:  Chest pain.  EXAM: ULTRASOUND ABDOMEN COMPLETE  COMPARISON:  None.  FINDINGS: Gallbladder:  The gallbladder is contracted at the time of examination. No calculi are identified. There is no evidence of wall thickening or sonographic Murphy sign.  Common bile duct:  Diameter: Normal caliber of 3 mm.  Liver:  No focal lesion identified. Within normal limits in parenchymal echogenicity.  IVC:  No abnormality visualized.  Pancreas:  Visualized portion unremarkable.  Spleen:  Size and appearance within normal limits.  Right Kidney:  Length: 10.9 cm. Echogenicity within normal limits. No mass or hydronephrosis visualized.  Left Kidney:  Length: 11.3 cm. Echogenicity within normal limits. No mass or hydronephrosis visualized.  Abdominal  aorta:  No aneurysm visualized.  Other findings:  None.  IMPRESSION: Unremarkable abdominal ultrasound. The gallbladder is contracted at the time of imaging and shows no visible calculi.   Electronically Signed   By: Aletta Edouard M.D.   On: 03/28/2014 02:41     EKG Interpretation None      MDM  Normal labs and ultrasound Patien twill be treatedwihtCarafate and Prevacid and referred to GI for follow uop Final diagnoses:  Gastritis        Garald Balding, NP 03/28/14 6712  Garald Balding, NP 03/28/14 4580

## 2014-03-28 NOTE — ED Provider Notes (Signed)
Medical screening examination/treatment/procedure(s) were performed by non-physician practitioner and as supervising physician I was immediately available for consultation/collaboration.      Wynetta Fines, MD 03/28/14 [phone]

## 2014-03-28 NOTE — Discharge Instructions (Signed)
Gastritis, Adult Gastritis is soreness and puffiness (inflammation) of the lining of the stomach. If you do not get help, gastritis can cause bleeding and sores (ulcers) in the stomach. HOME CARE   Only take medicine as told by your doctor.  If you were given antibiotic medicines, take them as told. Finish the medicines even if you start to feel better.  Drink enough fluids to keep your pee (urine) clear or pale yellow.  Avoid foods and drinks that make your problems worse. Foods you may want to avoid include:  Caffeine or alcohol.  Chocolate.  Mint.  Garlic and onions.  Spicy foods.  Citrus fruits, including oranges, lemons, or limes.  Food containing tomatoes, including sauce, chili, salsa, and pizza.  Fried and fatty foods.  Eat small meals throughout the day instead of large meals. GET HELP RIGHT AWAY IF:   You have black or dark red poop (stools).  You throw up (vomit) blood. It may look like coffee grounds.  You cannot keep fluids down.  Your belly (abdominal) pain gets worse.  You have a fever.  You do not feel better after 1 week.  You have any other questions or concerns. MAKE SURE YOU:   Understand these instructions.  Will watch your condition.  Will get help right away if you are not doing well or get worse. Document Released: 05/16/2008 Document Revised: 02/20/2012 Document Reviewed: 01/11/2012 [facility] Patient Information 2014 [place]. Please take the medication as directed Follow up with the GI doctor as needed.  The medications prescribed are safe in pregnancy so please do not hesitate taking them

## 2014-04-28 ENCOUNTER — Encounter (HOSPITAL_COMMUNITY): Payer: Self-pay

## 2014-04-28 ENCOUNTER — Inpatient Hospital Stay (HOSPITAL_COMMUNITY)
Admission: AD | Admit: 2014-04-28 | Discharge: 2014-04-28 | Disposition: A | Discharge status: Home / Self Care | Payer: No Typology Code available for payment source | Source: Ambulatory Visit | Attending: Obstetrics and Gynecology | Admitting: Obstetrics and Gynecology

## 2014-04-28 ENCOUNTER — Inpatient Hospital Stay (HOSPITAL_COMMUNITY): Payer: No Typology Code available for payment source

## 2014-04-28 DIAGNOSIS — Z87891 Personal history of nicotine dependence: Secondary | ICD-10-CM | POA: Insufficient documentation

## 2014-04-28 DIAGNOSIS — N949 Unspecified condition associated with female genital organs and menstrual cycle: Secondary | ICD-10-CM | POA: Insufficient documentation

## 2014-04-28 DIAGNOSIS — N938 Other specified abnormal uterine and vaginal bleeding: Secondary | ICD-10-CM | POA: Insufficient documentation

## 2014-04-28 DIAGNOSIS — E039 Hypothyroidism, unspecified: Secondary | ICD-10-CM | POA: Insufficient documentation

## 2014-04-28 LAB — CBC
HCT: 38.7 % (ref 36.0–46.0)
HEMOGLOBIN: 12.8 g/dL (ref 12.0–15.0)
MCH: 28.7 pg (ref 26.0–34.0)
MCHC: 33.1 g/dL (ref 30.0–36.0)
MCV: 86.8 fL (ref 78.0–100.0)
PLATELETS: 229 10*3/uL (ref 150–400)
RBC: 4.46 MIL/uL (ref 3.87–5.11)
RDW: 13.7 % (ref 11.5–15.5)
WBC: 8.7 10*3/uL (ref 4.0–10.5)

## 2014-04-28 LAB — URINE MICROSCOPIC-ADD ON

## 2014-04-28 LAB — URINALYSIS, ROUTINE W REFLEX MICROSCOPIC
BILIRUBIN URINE: NEGATIVE
Glucose, UA: NEGATIVE mg/dL
KETONES UR: NEGATIVE mg/dL
Leukocytes, UA: NEGATIVE
NITRITE: NEGATIVE
PH: 5.5 (ref 5.0–8.0)
PROTEIN: NEGATIVE mg/dL
Specific Gravity, Urine: >1.03 — ABNORMAL HIGH (ref 1.005–1.030)
Urobilinogen, UA: 0.2 mg/dL (ref 0.0–1.0)

## 2014-04-28 LAB — WET PREP, GENITAL
CLUE CELLS WET PREP: NONE SEEN
Trich, Wet Prep: NONE SEEN
Yeast Wet Prep HPF POC: NONE SEEN

## 2014-04-28 LAB — HCG, QUANTITATIVE, PREGNANCY: HCG, BETA CHAIN, QUANT, S: 631 m[IU]/mL — AB (ref <5)

## 2014-04-28 NOTE — Discharge Instructions (Signed)
Vaginal Bleeding During Pregnancy, First Trimester °A small amount of bleeding (spotting) from the vagina is relatively common in early pregnancy. It usually stops on its own. Various things may cause bleeding or spotting in early pregnancy. Some bleeding may be related to the pregnancy, and some may not. In most cases, the bleeding is normal and is not a problem. However, bleeding can also be a sign of something serious. Be sure to tell your health care provider about any vaginal bleeding right away. °Some possible causes of vaginal bleeding during the first trimester include: °· Infection or inflammation of the cervix. °· Growths (polyps) on the cervix. °· Miscarriage or threatened miscarriage. °· Pregnancy tissue has developed outside of the uterus and in a fallopian tube (tubal pregnancy). °· Tiny cysts have developed in the uterus instead of pregnancy tissue (molar pregnancy). °HOME CARE INSTRUCTIONS  °Watch your condition for any changes. The following actions may help to lessen any discomfort you are feeling: °· Follow your health care provider's instructions for limiting your activity. If your health care provider orders bed rest, you may need to stay in bed and only get up to use the bathroom. However, your health care provider may allow you to continue light activity. °· If needed, make plans for someone to help with your regular activities and responsibilities while you are on bed rest. °· Keep track of the number of pads you use each day, how often you change pads, and how soaked (saturated) they are. Write this down. °· Do not use tampons. Do not douche. °· Do not have sexual intercourse or orgasms until approved by your health care provider. °· If you pass any tissue from your vagina, save the tissue so you can show it to your health care provider. °· Only take over-the-counter or prescription medicines as directed by your health care provider. °· Do not take aspirin because it can make you  bleed. °· Keep all follow-up appointments as directed by your health care provider. °SEEK MEDICAL CARE IF: °· You have any vaginal bleeding during any part of your pregnancy. °· You have cramps or labor pains. °SEEK IMMEDIATE MEDICAL CARE IF:  °· You have severe cramps in your back or belly (abdomen). °· You have a fever, not controlled by medicine. °· You pass large clots or tissue from your vagina. °· Your bleeding increases. °· You feel lightheaded or weak, or you have fainting episodes. °· You have chills. °· You are leaking fluid or have a gush of fluid from your vagina. °· You pass out while having a bowel movement. °MAKE SURE YOU: °· Understand these instructions. °· Will watch your condition. °· Will get help right away if you are not doing well or get worse. °Document Released: 09/07/2005 Document Revised: 09/18/2013 Document Reviewed: 08/05/2013 °[employer]® Patient Information ©2014 [employer]. ° °

## 2014-04-28 NOTE — MAU Note (Signed)
Positive UPT yesterday. Last seen at [facility] for gyn in March. Pink/red vaginal spotting since this afternoon. Lower abdominal cramping this afternoon as well.

## 2014-04-28 NOTE — MAU Provider Note (Signed)
History   38 yo [id number] at unknow gestation, pt reports pos home UPT yesterday.  Pt reports red vaginal spotting on toilet paper since this afternoon, has not needed to wear pads.  IC 2 days ago.  Denies cramping.  LMP = 03/13/14 giving an EDD of 12/18/14.  Denies recent fever, resp or GI c/o's, UTI s/s.  Recent SABs 11/2013 and 08/2013 at 7 weeks. 04/02/14 hCG <2.0  Patient Active Problem List   Diagnosis Date Noted  . GOITER, UNSPECIFIED 07/20/2010  . MIGRAINE HEADACHE 07/20/2010  . SEIZURE DISORDER, HX OF 07/20/2010  . CH[place], [place] OF 07/20/2010    Chief Complaint  Patient presents with  . Possible Pregnancy  . Vaginal Bleeding   HPI  OB History   Grav Para Term Preterm Abortions TAB SAB Ect Mult Living   4 1 1  2  2   1       Past Medical History  Diagnosis Date  . Hypothyroidism   . Seizures     last seizure 08/11/13  . PONV (postoperative nausea and vomiting)   . SVD (spontaneous vaginal delivery)     x 1  . Headache(784.0)   . Endometriosis   . Fibroid     Past Surgical History  Procedure Laterality Date  . Breast reduction surgery    . Ganglion cyst excision      right hand  . Dilation and evacuation N/A 11/25/2013    Procedure: DILATATION AND EVACUATION;  Surgeon: Woodroe Mode, MD;  Location: [facility];  Service: Gynecology;  Laterality: N/A;  . Dilation and curettage of uterus    . Dilation and evacuation N/A 11/30/2013    Procedure: DILATATION AND EVACUATION;  Surgeon: Jonnie Kind, MD;  Location: [facility];  Service: Gynecology;  Laterality: N/A;    Family History  Problem Relation Age of Onset  . Lung cancer Maternal Grandmother   . Colon cancer Maternal Grandmother   . Cancer Other   . Diabetes Other   . High blood pressure Other   . Diabetes Mother   . Hypertension Father   . Hypertension Paternal Grandmother   . Diabetes Paternal Grandmother   . Heart disease Paternal Grandmother     History  Substance Use Topics  . Smoking status: Former  Smoker -- 2 years    Types: Cigarettes    Quit date: 04/28/1997  . Smokeless tobacco: Never Used     Comment: smoked in HS  . Alcohol Use: No    Allergies:  Allergies  Allergen Reactions  . Other Anaphylaxis and Itching    cantaloupe  . Cocoa Butter Hives  . Codeine Other (See Comments)    seizures    Prescriptions prior to admission  Medication Sig Dispense Refill  . acetaminophen (TYLENOL) 325 MG tablet Take 325 mg by mouth every 6 (six) hours as needed for moderate pain (pain).       . Cholecalciferol (VITAMIN D-3) 5000 UNITS TABS Take 10,000 Units by mouth daily.      . Prenatal Vit-Fe Fumarate-FA (MULTIVITAMIN-PRENATAL) 27-0.8 MG TABS tablet Take 1 tablet by mouth at bedtime.       . Thyroid 48.75 MG TABS Take 48.75 mg by mouth every morning.         ROS ROS: see HPI above, all other systems are negative  Physical Exam   Blood pressure 130/71, pulse 95, temperature 98.7 F (37.1 C), temperature source Oral, resp. rate 18, height 5\' 4"  (1.626 m), weight 271 lb 9.6  oz (123.197 kg), last menstrual period 03/13/2014, SpO2 100.00%, not currently breastfeeding.  Physical Exam Chest: Clear Heart: RRR Abdomen: gravid, NT Extremities: WNL  Pelvic exam:   VULVA: normal appearing vulva with no masses, tenderness or lesions VAGINA: normal appearing vagina with normal color, no lesions, vaginal discharge - Moderate amount of blood noted CERVIX: normal appearing cervix without lesions, blood noted at os  UTERUS: uterus is normal size, shape, consistency and nontender ADNEXA: normal adnexa in size, nontender and no masses, exam chaperoned by RN.  Results for orders placed during the hospital encounter of 04/28/14 (from the past 24 hour(s))  URINALYSIS, ROUTINE W REFLEX MICROSCOPIC     Status: Abnormal   Collection Time    04/28/14  7:28 PM      Result Value Ref Range   Color, Urine YELLOW  YELLOW   APPearance CLEAR  CLEAR   Specific Gravity, Urine >1.030 (*) 1.005 - 1.030    pH 5.5  5.0 - 8.0   Glucose, UA NEGATIVE  NEGATIVE mg/dL   Hgb urine dipstick LARGE (*) NEGATIVE   Bilirubin Urine NEGATIVE  NEGATIVE   Ketones, ur NEGATIVE  NEGATIVE mg/dL   Protein, ur NEGATIVE  NEGATIVE mg/dL   Urobilinogen, UA 0.2  0.0 - 1.0 mg/dL   Nitrite NEGATIVE  NEGATIVE   Leukocytes, UA NEGATIVE  NEGATIVE  URINE MICROSCOPIC-ADD ON     Status: Abnormal   Collection Time    04/28/14  7:28 PM      Result Value Ref Range   Squamous Epithelial / LPF FEW (*) RARE   WBC, UA 0-2  <3 WBC/hpf   RBC / HPF 21-50  <3 RBC/hpf   Urine-Other MUCOUS PRESENT    CBC     Status: None   Collection Time    04/28/14  8:05 PM      Result Value Ref Range   WBC 8.7  4.0 - 10.5 K/uL   RBC 4.46  3.87 - 5.11 MIL/uL   Hemoglobin 12.8  12.0 - 15.0 g/dL   HCT 38.7  36.0 - 46.0 %   MCV 86.8  78.0 - 100.0 fL   MCH 28.7  26.0 - 34.0 pg   MCHC 33.1  30.0 - 36.0 g/dL   RDW 13.7  11.5 - 15.5 %   Platelets 229  150 - 400 K/uL  HCG, QUANTITATIVE, PREGNANCY     Status: Abnormal   Collection Time    04/28/14  8:05 PM      Result Value Ref Range   hCG, Beta Chain, Quant, S 631 (*) <5 mIU/mL   U/S: Intrauterine gestational sac: None.  Maternal uterus/adnexae: Several small ill-defined hypoechoic lesions within the uterus, largest of which measures 1.6 x 0.9 x 1.9 cm in the posterior aspect of the fundus, compatible with small  leiomyomas. Bilateral ovaries are normal in appearance with multiple small follicles. No significant free fluid within the cul-de-sac.  IMPRESSION:  1. No IUP identified.  2. No acute findings.  3. Multiple tiny fibroids incidentally noted   ED Course  Vaginal bleeding vs threatened AB hCG  631  CBC - WNL U/S - no IUP Wet prep and GC/CT collected  D/c home with precautions Appointment with [facility] already scheduled for tomorrow with LC Follow up in the office in 48 hours for repeat hCG   Linda Hedges CNM, MSN 04/28/2014 9:48 PM

## 2014-04-29 LAB — GC/CHLAMYDIA PROBE AMP
CT Probe RNA: NEGATIVE
GC Probe RNA: NEGATIVE

## 2014-04-30 ENCOUNTER — Inpatient Hospital Stay (HOSPITAL_COMMUNITY)
Admission: AD | Admit: 2014-04-30 | Discharge: 2014-04-30 | Disposition: A | Discharge status: Home / Self Care | Payer: PRIVATE HEALTH INSURANCE | Source: Ambulatory Visit | Attending: Obstetrics and Gynecology | Admitting: Obstetrics and Gynecology

## 2014-04-30 ENCOUNTER — Encounter (HOSPITAL_COMMUNITY): Payer: Self-pay | Admitting: *Deleted

## 2014-04-30 DIAGNOSIS — O26859 Spotting complicating pregnancy, unspecified trimester: Secondary | ICD-10-CM | POA: Insufficient documentation

## 2014-04-30 DIAGNOSIS — Z09 Encounter for follow-up examination after completed treatment for conditions other than malignant neoplasm: Secondary | ICD-10-CM | POA: Insufficient documentation

## 2014-04-30 DIAGNOSIS — E039 Hypothyroidism, unspecified: Secondary | ICD-10-CM | POA: Insufficient documentation

## 2014-04-30 DIAGNOSIS — Z87891 Personal history of nicotine dependence: Secondary | ICD-10-CM | POA: Insufficient documentation

## 2014-04-30 LAB — HCG, QUANTITATIVE, PREGNANCY: hCG, Beta Chain, Quant, S: 505 m[IU]/mL — ABNORMAL HIGH (ref <5)

## 2014-04-30 NOTE — Discharge Instructions (Signed)
Infertility WHAT IS INFERTILITY?  Infertility is usually defined as not being able to get pregnant after trying for one year of regular sexual intercourse without the use of contraceptives. Or not being able to carry a pregnancy to term and have a baby. The infertility rate in the [place] is around 10%. Pregnancy is the result of a chain of events. A woman must release an egg from one of her ovaries (ovulation). The egg must be fertilized by the female sperm. Then it travels through a fallopian tube into the uterus (womb), where it attaches to the wall of the uterus and grows. A man must have enough sperm, and the sperm must join with (fertilize) the egg along the way, at the proper time. The fertilized egg must then become attached to the inside of the uterus. While this may seem simple, many things can happen to prevent pregnancy from occurring.  WHOSE PROBLEM IS IT?  About 20% of infertility cases are due to problems with the man (female factors) and 65% are due to problems with the woman (female factors). Other cases are due to a combination of female and female factors or to unknown causes.  WHAT CAUSES INFERTILITY IN MEN?  Infertility in men is often caused by problems with making enough normal sperm or getting the sperm to reach the egg. Problems with sperm may exist from birth or develop later in life, due to illness or injury. Some men produce no sperm, or produce too few sperm (oligospermia). Other problems include:  Sexual dysfunction.  Hormonal or endocrine problems.  Age. Female fertility decreases with age, but not at as young an age as female fertility.  Infection.  Congenital problems. Birth defect, such as absence of the tubes that carry the sperm (vas deferens).  Genetic/chromosomal problems.  Antisperm antibody problems.  Retrograde ejaculation (sperm go into the bladder).  Varicoceles, spematoceles, or tumors of the testicles.  Lifestyle can influence the number and  quality of a man's sperm.  Alcohol and drugs can temporarily reduce sperm quality.  Environmental toxins, including pesticides and lead, may cause some cases of infertility in men. WHAT CAUSES INFERTILITY IN WOMEN?   Problems with ovulation account for most infertility in women. Without ovulation, eggs are not available to be fertilized.  Signs of problems with ovulation include irregular menstrual periods or no periods at all.  Simple lifestyle factors, including stress, diet, or athletic training, can affect a woman's hormonal balance.  Age. Fertility begins to decrease in women in the early 30s and is worse after age 37.  Much less often, a hormonal imbalance from a serious medical problem, such as a pituitary gland tumor, thyroid or other chronic medical disease, can cause ovulation problems.  Pelvic infections.  Polycystic ovary syndrome (increase in female hormones, unable to ovulate).  Alcohol or illegal drugs.  Environmental toxins, radiation, pesticides, and certain chemicals.  Aging is an important factor in female infertility.  The ability of a woman's ovaries to produce eggs declines with age, especially after age 35. About one third of couples where the woman is over 35 will have problems with fertility.  By the time she reaches menopause when her monthly periods stop for good, a woman can no longer produce eggs or become pregnant.  Other problems can also lead to infertility in women. If the fallopian tubes are blocked at one or both ends, the egg cannot travel through the tubes into the uterus. Scar tissue (adhesions) in the pelvis may cause blocked   tubes. This may result from pelvic inflammatory disease, endometriosis, or surgery for an ectopic pregnancy (fertilized egg implanted outside the uterus) or any pelvic or abdominal surgery causing adhesions.  Fibroid tumors or polyps of the uterus.  Congenital (birth defect) abnormalities of the uterus.  Infection of the  cervix (cervicitis).  Cervical stenosis (narrowing).  Abnormal cervical mucus.  Polycystic ovary syndrome.  Having sexual intercourse too often (every other day or 4 to 5 times a week).  Obesity.  Anorexia.  Poor nutrition.  Over exercising, with loss of body fat.  DES. Your mother received diethylstilbesterol hormone when pregnant with you. HOW IS INFERTILITY TESTED?  If you have been trying to have a baby without success, you may want to seek medical help. You should not wait for one year of trying before seeing a health care provider if:  You are over 35.  You have reason to believe that there may be a fertility problem. A medical evaluation may determine the reasons for a couple's infertility. Usually this process begins with:  Physical exams.  Medical histories of both partners.  Sexual histories of both partners. If there is no obvious problem, like improperly timed intercourse or absence of ovulation, tests may be needed.   For a man, testing usually begins with tests of his semen to look at:  The number of sperm.  The shape of sperm.  Movement of his sperm.  Taking a complete medical and surgical history.  Physical examination.  Check for infection of the female reproductive organs. Sometimes hormone tests are done.   For a woman, the first step in testing is to find out if she is ovulating each month. There are several ways to do this. For example, she can keep track of changes in her morning body temperature and in the texture of her cervical mucus. Another tool is a home ovulation test kit, which can be bought at drug or grocery stores.  Checks of ovulation can also be done in the doctor's office, using blood tests for hormone levels or ultrasound tests of the ovaries. If the woman is ovulating, more tests will need to be done. Some common female tests include:  Hysterosalpingogram: An x-ray of the fallopian tubes and uterus after they are injected with  dye. It shows if the tubes are open and shows the shape of the uterus.  Laparoscopy: An exam of the tubes and other female organs for disease. A lighted tube called a laparoscope is used to see inside the abdomen.  Endometrial biopsy: Sample of uterus tissue taken on the first day of the menstrual period, to see if the tissue indicates you are ovulating.  Transvaginal ultrasound: Examines the female organs.  Hysteroscopy: Uses a lighted tube to examine the cervix and inside the uterus, to see if there are any abnormalities inside the uterus. TREATMENT  Depending on the test results, different treatments can be suggested. The type of treatment depends on the cause. 85 to 90% of infertility cases are treated with drugs or surgery.   Various fertility drugs may be used for women with ovulation problems. It is important to talk with your caregiver about the drug to be used. You should understand the drug's benefits and side effects. Depending on the type of fertility drug and the dosage of the drug used, multiple births (twins or multiples) can occur in some women.  If needed, surgery can be done to repair damage to a woman's ovaries, fallopian tubes, cervix, or uterus.  Surgery   or medical treatment for endometriosis or polycystic ovary syndrome. Sometimes a man has an infertility problem that can be corrected with medicine or by surgery.  Intrauterine insemination (IUI) of sperm, timed with ovulation.  Change in lifestyle, if that is the cause (lose weight, increase exercise, and stop smoking, drinking excessively, or taking illegal drugs).  Other types of surgery:  Removing growths inside and on the uterus.  Removing scar tissue from inside of the uterus.  Fixing blocked tubes.  Removing scar tissue in the pelvis and around the female organs. WHAT IS ASSISTED REPRODUCTIVE TECHNOLOGY (ART)?  Assisted reproductive technology (ART) is another form of special methods used to help infertile  couples. ART involves handling both the woman's eggs and the man's sperm. Success rates vary and depend on many factors. ART can be expensive and time-consuming. But ART has made it possible for many couples to have children that otherwise would not have been conceived. Some methods are listed below:  In vitro fertilization (IVF). IVF is often used when a woman's fallopian tubes are blocked or when a man has low sperm counts. A drug is used to stimulate the ovaries to produce multiple eggs. Once mature, the eggs are removed and placed in a culture dish with the man's sperm for fertilization. After about 40 hours, the eggs are examined to see if they have become fertilized by the sperm and are dividing into cells. These fertilized eggs (embryos) are then placed in the woman's uterus. This bypasses the fallopian tubes.  Gamete intrafallopian transfer (GIFT) is similar to IVF, but used when the woman has at least one normal fallopian tube. Three to five eggs are placed in the fallopian tube, along with the man's sperm, for fertilization inside the woman's body.  Zygote intrafallopian transfer (ZIFT), also called tubal embryo transfer, combines IVF and GIFT. The eggs retrieved from the woman's ovaries are fertilized in the lab and placed in the fallopian tubes rather than in the uterus.  ART procedures sometimes involve the use of donor eggs (eggs from another woman) or previously frozen embryos. Donor eggs may be used if a woman has impaired ovaries or has a genetic disease that could be passed on to her baby.  When performing ART, you are at higher risk for resulting in multiple pregnancies, twins, triplets or more.  Intracytoplasma sperm injection is a procedure that injects a single sperm into the egg to fertilize it.  Embryo transplant is a procedure that starts after growing an embryo in a special media (chemical solution) developed to keep the embryo alive for 2 to 5 days, and then transplanting it  into the uterus. In cases where a cause cannot be found and pregnancy does not occur, adoption may be a consideration. Document Released: 12/01/2003 Document Revised: 02/20/2012 Document Reviewed: 10/27/2009 [facility] Patient Information 2014 [place]. Genetic Counseling Genetic counseling helps people look at the risks of genetic problems in pregnancy. Genetic problems are problems that may be passed from the mother and father to their child.  Genetic counseling is done:  To see if a couple is at risk of passing on a problem.  To explain the cause of a disorder if present.  To understand the odds passing it on to the baby.  To determine what can be done medically.  To use in family planning to prevent or avoid a problem. Genetic counseling can be done by:  A geneticist.  A physician with special training in genetics.  Genetic counselors. The following are  reasons to seek a referral for genetic counseling and/or genetic evaluation with a genetic physician: FAMILY HISTORY FACTORS   Mental retardation.  Neural tube defects (such as spina bifida).  Chromosome abnormalities (such as Down syndrome, Fragile X syndrome).  Cleft lip/cleft palate.  Heart defects.  Short stature.  Single gene defects (such as cystic fibrosis or PKU).  Hearing or visual impairments.  Learning disabilities.  Psychiatric disorders.  Cancers (breast, uterus, ovary and intestine).  Multiple pregnancy losses (miscarriages, stillbirths, or infant deaths).  Other disorders which could be considered genetic.  Either parent with a dominant disorder or any disorder seen in several generations including:  High blood pressure.  High cholesterol.  Muscular dystrophy.  Huntington's chorea.  Polycystic kidney. IF BOTH PARENTS ARE CARRIERS FOR:   Depression.  Seizures (convulsions).  Alcoholism.  Diabetes.  Thyroid disorder.  Others in which birth defects may be associated  either with the disease process or with common medications prescribed for the disease.  Fetal or parental exposure to potentially harmful agents. These include:  Drugs.  Chemicals.  Infection.  Radiation.  Infertility cases where either parent is suspected of having a chromosome abnormality.  Couples requiring assisted reproductive techniques to achieve a pregnancy, or individuals donating eggs or sperm for those purposes. OTHER FACTORS   Persons in specific ethnic groups or geographic areas with a higher incidence of certain disorders. These include Dyann Kief disease, sickle cell disease, or thalassemia.  Extreme parental concern or fear of having a child with a birth defect.  Parents are blood relatives (consanguinity) or incest in a pregnancy is involved.  Premarital or preconception counseling in couples at high risk for genetic disorders based on family or personal medical history.  The birth of an affected child or by carrier screening.  Mother, known, or presumed carrier of an X-linked disorder such as hemophilia.  Either parent is a known carrier of a balanced chromosome abnormality.  A previous baby with a genetic disorder.  After the baby is born, you find out the baby has a genetic disorder. PREGNANCY FACTORS   The mother is over 70 years old a the time of delivery.  Maternal serum screening indicating an increased risk for neural tube defects, Down Syndrome, or trisomy 18.  Abnormal prenatal diagnostic test results or abnormal prenatal ultrasound examination.  Maternal factors such as:  Schizophrenia.  Seizures.  Depression.  Alcoholism.  Diabetes.  Thyroid disorder.  Other factors in which birth defects may be associated either with the disease process or with common medications prescribed for the disease.  Fetal or parental exposure to potentially harmful agents such as:  Drugs.  Chemicals.  Radiation.  Infection.  The father is over  the age of 85 years at the time of conception.  Infertility cases where either parent is suspected of having a chromosome abnormality.  Couples requiring assisted reproductive techniques to achieve a pregnancy, or individuals donating eggs or sperm for those purposes.  Recurrent pregnancy loss.  Stillborn baby. FINDING A GENETIC COUNSELOR Most caregivers know and have a list of genetic counselors. Your caregiver can refer you to one of them. You can also contact the [employer] for their locations and for more information.  AFTER RECEIVING GENETIC COUNSELING Genetic counselors can help you understand what options you have. They can help you understand what to do next. They can share the experiences of other couples who had babies with genetic problems. If you are at high risk for having a  baby with a severe or fatal genetic disorder, you have several options:  Have in vitro fertilization with healthy eggs that were fertilized in vitro.  Use donor sperm or eggs.  Adopt a baby. Genetic counselors can also help if you find out when you are pregnant that the baby has a severe or fatal genetic disorder. Some options are:  Help you prepare for a particular disorder by speaking to:  Specialists in caring for Down syndrome.  Surgeon for heart defects.  Social workers.  Support groups.  Fetal surgery is sometimes possible and helpful to treat some genetic defects.  Ending the pregnancy. Document Released: 02/18/2003 Document Revised: 02/20/2012 Document Reviewed: 01/09/2008 [facility] Patient Information 2014 [place].

## 2014-04-30 NOTE — MAU Note (Signed)
Pt here for repeat BHCG, spotting today but no pain.

## 2014-05-01 ENCOUNTER — Other Ambulatory Visit: Payer: Self-pay | Admitting: Obstetrics and Gynecology

## 2014-05-01 NOTE — MAU Provider Note (Signed)
History   Patient is a 38yo who presents for repeat bHcG labs.  Patient unsure of gestational age, but suspects she is around 6 weeks and reports LMP of 03/13/14.  Patient reports some scant light pink blood, but denies abdominal pain or cramping.  bHcg 631 on 5/18.   Patient Active Problem List   Diagnosis Date Noted  . GOITER, UNSPECIFIED 07/20/2010  . MIGRAINE HEADACHE 07/20/2010  . SEIZURE DISORDER, HX OF 07/20/2010  . CHICKENPOX, Ulen OF 07/20/2010    Chief Complaint  Patient presents with  . Follow-up   HPI  OB History   Grav Para Term Preterm Abortions TAB SAB Ect Mult Living   4 1 1  2  2   1       Past Medical History  Diagnosis Date  . Hypothyroidism   . Seizures     last seizure 08/11/13  . PONV (postoperative nausea and vomiting)   . SVD (spontaneous vaginal delivery)     x 1  . Headache(784.0)   . Endometriosis   . Fibroid     Past Surgical History  Procedure Laterality Date  . Breast reduction surgery    . Ganglion cyst excision      right hand  . Dilation and evacuation N/A 11/25/2013    Procedure: DILATATION AND EVACUATION;  Surgeon: Woodroe Mode, MD;  Location: [facility];  Service: Gynecology;  Laterality: N/A;  . Dilation and curettage of uterus    . Dilation and evacuation N/A 11/30/2013    Procedure: DILATATION AND EVACUATION;  Surgeon: Jonnie Kind, MD;  Location: [facility];  Service: Gynecology;  Laterality: N/A;    Family History  Problem Relation Age of Onset  . Lung cancer Maternal Grandmother   . Colon cancer Maternal Grandmother   . Cancer Other   . Diabetes Other   . High blood pressure Other   . Diabetes Mother   . Hypertension Father   . Hypertension Paternal Grandmother   . Diabetes Paternal Grandmother   . Heart disease Paternal Grandmother     History  Substance Use Topics  . Smoking status: Former Smoker -- 2 years    Types: Cigarettes    Quit date: 04/28/1997  . Smokeless tobacco: Never Used     Comment: smoked in HS  .  Alcohol Use: No    Allergies:  Allergies  Allergen Reactions  . Other Anaphylaxis and Itching    cantaloupe  . Cocoa Butter Hives  . Codeine Other (See Comments)    seizures    No prescriptions prior to admission    ROS  See HPI Above Physical Exam   Blood pressure 126/69, pulse 94, temperature 98.2 F (36.8 C), temperature source Oral, resp. rate 20, height 5\' 4"  (1.626 m), weight 275 lb (124.739 kg), last menstrual period 03/13/2014, SpO2 100.00%.  Physical Exam-Deferred   Results for orders placed during the hospital encounter of 04/30/14 (from the past 24 hour(s))  HCG, QUANTITATIVE, PREGNANCY     Status: Abnormal   Collection Time    04/30/14  9:10 PM      Result Value Ref Range   hCG, Beta Chain, Quant, S 505 (*) <5 mIU/mL    ED Course  Assessment: 38 y.o. Inevitable Abortion H/O Recurrent Abortion  Plan: -bHcG decreased from 631 to 22 -Discussed inevitable abortion -Bleeding precautions given -Discussed need for genetic couseling and possible MD consult for infertility -Patient unsure of whether she wants to wait for natural passing, medical, or surgical  intervention regarding POC. Patient to go home and discuss with husband and then follow up with office once decision made. -Miscarriage and Infertility information given -Discussed what to expect regarding natural passing and medications that will assist with pain. -No other questions at current, encouraged to call office anytime with additional questions or concerns. -Follow up in office   Gavin Pound CNM, MSN 05/01/2014 1:18 AM

## 2014-05-02 ENCOUNTER — Encounter (HOSPITAL_COMMUNITY): Payer: PRIVATE HEALTH INSURANCE | Admitting: Registered Nurse

## 2014-05-02 ENCOUNTER — Encounter (HOSPITAL_COMMUNITY): Payer: Self-pay | Admitting: Registered Nurse

## 2014-05-02 ENCOUNTER — Encounter (HOSPITAL_COMMUNITY)
Admission: RE | Disposition: A | Discharge status: Home / Self Care | Payer: Self-pay | Source: Ambulatory Visit | Attending: Obstetrics and Gynecology

## 2014-05-02 ENCOUNTER — Ambulatory Visit (HOSPITAL_COMMUNITY)
Admission: RE | Admit: 2014-05-02 | Discharge: 2014-05-02 | Disposition: A | Discharge status: Home / Self Care | Payer: PRIVATE HEALTH INSURANCE | Source: Ambulatory Visit | Attending: Obstetrics and Gynecology | Admitting: Obstetrics and Gynecology

## 2014-05-02 ENCOUNTER — Ambulatory Visit (HOSPITAL_COMMUNITY): Payer: PRIVATE HEALTH INSURANCE | Admitting: Registered Nurse

## 2014-05-02 DIAGNOSIS — O021 Missed abortion: Secondary | ICD-10-CM | POA: Diagnosis present

## 2014-05-02 DIAGNOSIS — G40909 Epilepsy, unspecified, not intractable, without status epilepticus: Secondary | ICD-10-CM | POA: Insufficient documentation

## 2014-05-02 DIAGNOSIS — Z6841 Body Mass Index (BMI) 40.0 and over, adult: Secondary | ICD-10-CM | POA: Insufficient documentation

## 2014-05-02 DIAGNOSIS — E039 Hypothyroidism, unspecified: Secondary | ICD-10-CM | POA: Insufficient documentation

## 2014-05-02 DIAGNOSIS — Z87891 Personal history of nicotine dependence: Secondary | ICD-10-CM | POA: Diagnosis not present

## 2014-05-02 HISTORY — PX: DILATION AND EVACUATION: SHX1459

## 2014-05-02 LAB — CBC
HCT: 39.9 % (ref 36.0–46.0)
HEMOGLOBIN: 13.1 g/dL (ref 12.0–15.0)
MCH: 28.5 pg (ref 26.0–34.0)
MCHC: 32.8 g/dL (ref 30.0–36.0)
MCV: 86.9 fL (ref 78.0–100.0)
Platelets: 232 10*3/uL (ref 150–400)
RBC: 4.59 MIL/uL (ref 3.87–5.11)
RDW: 13.9 % (ref 11.5–15.5)
WBC: 7.4 10*3/uL (ref 4.0–10.5)

## 2014-05-02 SURGERY — DILATION AND EVACUATION, UTERUS
Anesthesia: Monitor Anesthesia Care | Site: Vagina

## 2014-05-02 MED ORDER — DEXAMETHASONE SODIUM PHOSPHATE 10 MG/ML IJ SOLN
INTRAMUSCULAR | Status: DC | PRN
  Administered 2014-05-02: 10 mg via INTRAVENOUS

## 2014-05-02 MED ORDER — ONDANSETRON HCL 4 MG/2ML IJ SOLN
INTRAMUSCULAR | Status: AC
  Filled 2014-05-02: qty 2

## 2014-05-02 MED ORDER — KETOROLAC TROMETHAMINE 30 MG/ML IJ SOLN
INTRAMUSCULAR | Status: AC
  Filled 2014-05-02: qty 2

## 2014-05-02 MED ORDER — PROPOFOL 10 MG/ML IV EMUL
INTRAVENOUS | Status: AC
  Filled 2014-05-02: qty 20

## 2014-05-02 MED ORDER — PROPOFOL 10 MG/ML IV BOLUS
INTRAVENOUS | Status: DC | PRN
  Administered 2014-05-02 (×11): 20 mg via INTRAVENOUS

## 2014-05-02 MED ORDER — SCOPOLAMINE 1 MG/3DAYS TD PT72
MEDICATED_PATCH | TRANSDERMAL | Status: AC
  Administered 2014-05-02: 1.5 mg
  Filled 2014-05-02: qty 1

## 2014-05-02 MED ORDER — MIDAZOLAM HCL 2 MG/2ML IJ SOLN: 0.5000 mg | Freq: Once | INTRAMUSCULAR | Status: DC | PRN

## 2014-05-02 MED ORDER — BUPIVACAINE-EPINEPHRINE (PF) 0.5% -1:200000 IJ SOLN
INTRAMUSCULAR | Status: AC
  Filled 2014-05-02: qty 30

## 2014-05-02 MED ORDER — BUPIVACAINE-EPINEPHRINE 0.5% -1:200000 IJ SOLN
INTRAMUSCULAR | Status: DC | PRN
  Administered 2014-05-02: 10 mL

## 2014-05-02 MED ORDER — MIDAZOLAM HCL 2 MG/2ML IJ SOLN
INTRAMUSCULAR | Status: AC
  Filled 2014-05-02: qty 2

## 2014-05-02 MED ORDER — MIDAZOLAM HCL 5 MG/5ML IJ SOLN
INTRAMUSCULAR | Status: DC | PRN
  Administered 2014-05-02: 2 mg via INTRAVENOUS

## 2014-05-02 MED ORDER — ONDANSETRON HCL 4 MG/2ML IJ SOLN
INTRAMUSCULAR | Status: DC | PRN
  Administered 2014-05-02: 4 mg via INTRAVENOUS

## 2014-05-02 MED ORDER — DEXAMETHASONE SODIUM PHOSPHATE 10 MG/ML IJ SOLN
INTRAMUSCULAR | Status: AC
  Filled 2014-05-02: qty 1

## 2014-05-02 MED ORDER — PROMETHAZINE HCL 25 MG/ML IJ SOLN: 6.2500 mg | INTRAMUSCULAR | Status: DC | PRN

## 2014-05-02 MED ORDER — LACTATED RINGERS IV SOLN
INTRAVENOUS | Status: DC
Start: 2014-05-02 — End: 2014-05-02
  Administered 2014-05-02 (×2): via INTRAVENOUS

## 2014-05-02 MED ORDER — KETOROLAC TROMETHAMINE 30 MG/ML IJ SOLN
INTRAMUSCULAR | Status: DC | PRN
  Administered 2014-05-02: 30 mg via INTRAMUSCULAR
  Administered 2014-05-02: 30 mg via INTRAVENOUS

## 2014-05-02 MED ORDER — FENTANYL CITRATE 0.05 MG/ML IJ SOLN
INTRAMUSCULAR | Status: AC
  Filled 2014-05-02: qty 2

## 2014-05-02 MED ORDER — IBUPROFEN 800 MG PO TABS: 800.0000 mg | ORAL_TABLET | Freq: Three times a day (TID) | ORAL | Status: DC | PRN

## 2014-05-02 MED ORDER — PROMETHAZINE HCL 12.5 MG PO TABS: 12.5000 mg | ORAL_TABLET | Freq: Four times a day (QID) | ORAL | Status: DC | PRN

## 2014-05-02 MED ORDER — FENTANYL CITRATE 0.05 MG/ML IJ SOLN
INTRAMUSCULAR | Status: DC | PRN
  Administered 2014-05-02 (×2): 50 ug via INTRAVENOUS

## 2014-05-02 MED ORDER — KETOROLAC TROMETHAMINE 30 MG/ML IJ SOLN: 15.0000 mg | Freq: Once | INTRAMUSCULAR | Status: DC | PRN

## 2014-05-02 MED ORDER — LIDOCAINE HCL (CARDIAC) 20 MG/ML IV SOLN
INTRAVENOUS | Status: AC
  Filled 2014-05-02: qty 5

## 2014-05-02 MED ORDER — MEPERIDINE HCL 25 MG/ML IJ SOLN: 6.2500 mg | INTRAMUSCULAR | Status: DC | PRN

## 2014-05-02 MED ORDER — FENTANYL CITRATE 0.05 MG/ML IJ SOLN: 25.0000 ug | INTRAMUSCULAR | Status: DC | PRN

## 2014-05-02 SURGICAL SUPPLY — 21 items
CATH ROBINSON RED A/P 16FR (CATHETERS) ×3 IMPLANT
CLOTH BEACON ORANGE TIMEOUT ST (SAFETY) ×3 IMPLANT
DECANTER SPIKE VIAL GLASS SM (MISCELLANEOUS) ×3 IMPLANT
GLOVE BIOGEL PI IND STRL 8.5 (GLOVE) ×1 IMPLANT
GLOVE BIOGEL PI INDICATOR 8.5 (GLOVE) ×2
GLOVE ECLIPSE 8.0 STRL XLNG CF (GLOVE) ×6 IMPLANT
GOWN STRL REUS W/TWL LRG LVL3 (GOWN DISPOSABLE) ×6 IMPLANT
KIT BERKELEY 1ST TRIMESTER 3/8 (MISCELLANEOUS) ×3 IMPLANT
NDL SPNL 22GX3.5 QUINCKE BK (NEEDLE) ×1 IMPLANT
NEEDLE SPNL 22GX3.5 QUINCKE BK (NEEDLE) ×3 IMPLANT
NS IRRIG 1000ML POUR BTL (IV SOLUTION) ×3 IMPLANT
PACK VAGINAL MINOR WOMEN LF (CUSTOM PROCEDURE TRAY) ×3 IMPLANT
PAD OB MATERNITY 4.3X12.25 (PERSONAL CARE ITEMS) ×3 IMPLANT
PAD PREP 24X48 CUFFED NSTRL (MISCELLANEOUS) ×3 IMPLANT
SET BERKELEY SUCTION TUBING (SUCTIONS) ×3 IMPLANT
SYR CONTROL 10ML LL (SYRINGE) ×3 IMPLANT
TOWEL OR 17X24 6PK STRL BLUE (TOWEL DISPOSABLE) ×6 IMPLANT
VACURETTE 10 RIGID CVD (CANNULA) IMPLANT
VACURETTE 7MM CVD STRL WRAP (CANNULA) IMPLANT
VACURETTE 8 RIGID CVD (CANNULA) ×2 IMPLANT
VACURETTE 9 RIGID CVD (CANNULA) IMPLANT

## 2014-05-02 NOTE — Anesthesia Preprocedure Evaluation (Signed)
Anesthesia Evaluation  Patient identified by MRN, date of birth, ID band Patient awake    Reviewed: Allergy & Precautions, H&P , Patient's Chart, lab work & pertinent test results, reviewed documented beta blocker date and time   History of Anesthesia Complications Negative for: history of anesthetic complications  Airway Mallampati: II TM Distance: >3 FB Neck ROM: full    Dental   Pulmonary former smoker,  breath sounds clear to auscultation        Cardiovascular Exercise Tolerance: Good Rhythm:regular Rate:Normal     Neuro/Psych  Headaches, Seizures -,  negative psych ROS   GI/Hepatic   Endo/Other  Hypothyroidism Morbid obesity  Renal/GU      Musculoskeletal   Abdominal   Peds  Hematology   Anesthesia Other Findings   Reproductive/Obstetrics                           Anesthesia Physical Anesthesia Plan  ASA: III  Anesthesia Plan: MAC   Post-op Pain Management:    Induction:   Airway Management Planned:   Additional Equipment:   Intra-op Plan:   Post-operative Plan:   Informed Consent: I have reviewed the patients History and Physical, chart, labs and discussed the procedure including the risks, benefits and alternatives for the proposed anesthesia with the patient or authorized representative who has indicated his/her understanding and acceptance.   Dental Advisory Given  Plan Discussed with: CRNA, Surgeon and Anesthesiologist  Anesthesia Plan Comments:         Anesthesia Quick Evaluation

## 2014-05-02 NOTE — Discharge Instructions (Signed)
DISCHARGE INSTRUCTIONS: D&C / D&E The following instructions have been prepared to help you care for yourself upon your return home.  MAY TAKE IBUPROFEN AFTER 4:30 PM AS NEEDED FOR CRAMPS   Personal hygiene:  Use sanitary pads for vaginal drainage, not tampons.  Shower the day after your procedure.  NO tub baths, pools or Jacuzzis for 2-3 weeks.  Wipe front to back after using the bathroom.  Activity and limitations:  Do NOT drive or operate any equipment for 24 hours. The effects of anesthesia are still present and drowsiness may result.  Do NOT rest in bed all day.  Walking is encouraged.  Walk up and down stairs slowly.  You may resume your normal activity in one to two days or as indicated by your physician.  Sexual activity: NO intercourse for at least 2 weeks after the procedure, or as indicated by your physician.  Diet: Eat a light meal as desired this evening. You may resume your usual diet tomorrow.  Return to work: You may resume your work activities in one to two days or as indicated by your doctor.  What to expect after your surgery: Expect to have vaginal bleeding/discharge for 2-3 days and spotting for up to 10 days. It is not unusual to have soreness for up to 1-2 weeks. You may have a slight burning sensation when you urinate for the first day. Mild cramps may continue for a couple of days. You may have a regular period in 2-6 weeks.  Call your doctor for any of the following:  Excessive vaginal bleeding, saturating and changing one pad every hour.  Inability to urinate 6 hours after discharge from hospital.  Pain not relieved by pain medication.  Fever of 100.4 F or greater.  Unusual vaginal discharge or odor.      Dilation and Curettage or Vacuum Curettage, Care After Refer to this sheet in the next few weeks. These instructions provide you with information on caring for yourself after your procedure. Your health care provider may also give you  more specific instructions. Your treatment has been planned according to current medical practices, but problems sometimes occur. Call your health care provider if you have any problems or questions after your procedure. WHAT TO EXPECT AFTER THE PROCEDURE After your procedure, it is typical to have light cramping and bleeding. This may last for 2 days to 2 weeks after the procedure. HOME CARE INSTRUCTIONS   Do not drive for 24 hours.  Wait 1 week before returning to strenuous activities.  Take your temperature 2 times a day for 4 days and write it down. Provide these temperatures to your health care provider if you develop a fever.  Avoid long periods of standing.  Avoid heavy lifting, pushing, or pulling. Do not lift anything heavier than 10 pounds (4.5 kg).  Limit stair climbing to once or twice a day.  Take rest periods often.  You may resume your usual diet.  Drink enough fluids to keep your urine clear or pale yellow.  Your usual bowel function should return. If you have constipation, you may:  Take a mild laxative with permission from your health care provider.  Add fruit and bran to your diet.  Drink more fluids.  Take showers instead of baths until your health care provider gives you permission to take baths.  Do not go swimming or use a hot tub until your health care provider approves.  Try to have someone with you or available to you the  first 24 48 hours, especially if you were given a general anesthetic.  Do not douche, use tampons, or have intercourse for 2 weeks after the procedure.  Only take over-the-counter or prescription medicines as directed by your health care provider. Do not take aspirin. It can cause bleeding.  Follow up with your health care provider as directed. SEEK MEDICAL CARE IF:   You have increasing cramps or pain that is not relieved with medicine.  You have abdominal pain that does not seem to be related to the same area of earlier  cramping and pain.  You have bad smelling vaginal discharge.  You have a rash.  You are having problems with any medicine. SEEK IMMEDIATE MEDICAL CARE IF:   You have bleeding that is heavier than a normal menstrual period.  You have a fever.  You have chest pain.  You have shortness of breath.  You feel dizzy or feel like fainting.  You pass out.  You have pain in your shoulder strap area.  You have heavy vaginal bleeding with or without blood clots. Document Released: 11/25/2000 Document Revised: 09/18/2013 Document Reviewed: 06/27/2013 [facility] Patient Information 2014 [place], [place].

## 2014-05-02 NOTE — Op Note (Signed)
OPERATIVE NOTE  Sharon Benson  DOB:    07-30-1976  MRN:    [id number]  CSN:    [id number]  Date of Surgery:  05/02/2014  Preoperative Diagnosis:  Missed Abortion in the first trimester  Postoperative Diagnosis:  Missed Abortion in the first trimester  Procedure:  FIRST TRIMESTER SUCTION DILATATION AND CURETTAGE  Surgeon:  Gildardo Cranker, M.D.  Assistant:  None  Anesthetic:  Monitor Anesthesia Care  Disposition:  The patient is a 38 y.o. year old female who presents at Unknown gestation with a nonviable gestation based on ultrasound and/or quantitative hCG values. She understands the indications for her surgical procedure as well as the alternative treatment options. She accepts the risk of, but not limited to, anesthetic complications, bleeding, infections, and possible damage to the surrounding organs.  Findings:  The patient's blood type is O. positive. A small amount of products of conception were removed from within a 8 weeks size uterus. No adnexal masses were appreciated.  Procedure:  The patient was taken to the operating room where a Monitor Anesthesia Care anesthetic was given. The patient's abdomen, perineum, and vagina were prepped with Betadine. The bladder was drained of urine. The patient was sterilely draped. Examination under anesthesia was performed. A paracervical block was placed using 10 cc of half percent Marcaine with epinephrine. The uterus sounded to 9 centimeters. The cervix was gently dilated. The uterine cavity was evacuated using a size 8 suction curet. The cavity was then cleaned using a medium sharp curet. The cavity was felt to be clean at the end of our procedure. The estimated blood loss was 5 cc. The uterus was reexamined and was noted to be firm. Hemostasis was adequate. Sponge and needle counts were correct. The patient tolerated her but her procedure well. She was awakened from her anesthetic without difficulty. She was  transported to the recovery room in stable condition. The products of conception were sent to pathology.  Followup instructions:  The patient return to see Dr. Raphael Gibney in 2 weeks. She was given prescriptions for:  Motrin 800 mg every 8 hours as needed for pain Bennigan 12.5 mg every 6 hours as needed for nausea.  She was given instructions for patients who've undergone the surgical procedure. She will call for questions or concerns.  Gildardo Cranker, M.D.

## 2014-05-02 NOTE — Anesthesia Postprocedure Evaluation (Signed)
  Anesthesia Post Note  Patient: Sharon Benson  Procedure(s) Performed: Procedure(s) (LRB): DILATATION AND EVACUATION (N/A)  Anesthesia type: MAC  Patient location: PACU  Post pain: Pain level controlled  Post assessment: Post-op Vital signs reviewed  Last Vitals:  Filed Vitals:   05/02/14 0927  Pulse: 96  Temp: 36.4 C  Resp: 22    Post vital signs: Reviewed  Level of consciousness: sedated  Complications: No apparent anesthesia complications

## 2014-05-02 NOTE — H&P (Signed)
Admission History and Physical Exam for a Gynecology Patient  [name] is a 38 y.o. female, [phone], who presents for D & E for a first trimester miscarriage. She has been followed at the [facility] and Gynecology division of [facility].  OB History   Grav Para Term Preterm Abortions TAB SAB Ect Mult Living   4 1 1  2  2   1       Past Medical History  Diagnosis Date  . Hypothyroidism   . Seizures     last seizure 08/11/13  . PONV (postoperative nausea and vomiting)   . SVD (spontaneous vaginal delivery)     x 1  . Headache(784.0)   . Endometriosis   . Fibroid     Prescriptions prior to admission  Medication Sig Dispense Refill  . acetaminophen (TYLENOL) 325 MG tablet Take 325 mg by mouth every 6 (six) hours as needed for moderate pain (pain).       . Cholecalciferol (VITAMIN D-3) 5000 UNITS TABS Take 10,000 Units by mouth daily.      . Prenatal Vit-Fe Fumarate-FA (PRENATAL MULTIVITAMIN) TABS tablet Take 1 tablet by mouth at bedtime.      . Thyroid (NATURE-THROID) 48.75 MG TABS Take 1 tablet by mouth daily.        Past Surgical History  Procedure Laterality Date  . Breast reduction surgery    . Ganglion cyst excision      right hand  . Dilation and evacuation N/A 11/25/2013    Procedure: DILATATION AND EVACUATION;  Surgeon: Woodroe Mode, MD;  Location: [facility];  Service: Gynecology;  Laterality: N/A;  . Dilation and curettage of uterus    . Dilation and evacuation N/A 11/30/2013    Procedure: DILATATION AND EVACUATION;  Surgeon: Jonnie Kind, MD;  Location: [facility];  Service: Gynecology;  Laterality: N/A;    Allergies  Allergen Reactions  . Other Anaphylaxis and Itching    cantaloupe  . Cocoa Butter Hives  . Codeine Other (See Comments)    seizures    Family History: family history includes Cancer in her other; Colon cancer in her maternal grandmother; Diabetes in her mother, other, and paternal grandmother; Heart  disease in her paternal grandmother; High blood pressure in her other; Hypertension in her father and paternal grandmother; Lung cancer in her maternal grandmother.  Social History:  reports that she quit smoking about 17 years ago. Her smoking use included Cigarettes. She smoked 0.00 packs per day for 2 years. She has never used smokeless tobacco. She reports that she uses illicit drugs (Marijuana). She reports that she does not drink alcohol.  Review of systems: See HPI.  Admission Physical Exam:    There is no weight on file to calculate BMI.  Pulse 96, temperature 97.5 F (36.4 C), temperature source Oral, resp. rate 22, last menstrual period 03/13/2014, SpO2 100.00%.  HEENT:                 Within normal limits Chest:                   Clear Heart:                    Regular rate and rhythm Breasts:                No masses, skin changes, bleeding, or discharge present Abdomen:  Nontender, no masses Extremities:          Grossly normal Neurologic exam: Grossly normal  Pelvic exam:  External genitalia: normal general appearance Vaginal: normal without tenderness, induration or masses Cervix: normal appearance Adnexa: normal bimanual exam Uterus: 8 weeks size  Assessment:  First trimester missed abortion  Plan:  Dilation and evacuation is scheduled. The patient understands the indications for her procedure as well as the alternative treatment options. She accepts the risk of, but not limited to, anesthetic complications, bleeding, infections, and possible damage to the surrounding organs.   Ena Dawley 05/02/2014

## 2014-05-02 NOTE — Transfer of Care (Signed)
Immediate Anesthesia Transfer of Care Note  Patient: Sharon Benson  Procedure(s) Performed: Procedure(s): DILATATION AND EVACUATION (N/A)  Patient Location: PACU  Anesthesia Type:MAC  Level of Consciousness: sedated  Airway & Oxygen Therapy: Patient Spontanous Breathing and Patient connected to nasal cannula oxygen  Post-op Assessment: Report given to PACU RN  Post vital signs: Reviewed  Complications: No apparent anesthesia complications

## 2014-05-05 ENCOUNTER — Encounter (HOSPITAL_COMMUNITY): Payer: Self-pay | Admitting: Obstetrics and Gynecology

## 2014-05-25 ENCOUNTER — Inpatient Hospital Stay (HOSPITAL_COMMUNITY): Payer: PRIVATE HEALTH INSURANCE

## 2014-05-25 ENCOUNTER — Encounter (HOSPITAL_COMMUNITY): Payer: Self-pay | Admitting: Family

## 2014-05-25 ENCOUNTER — Inpatient Hospital Stay (HOSPITAL_COMMUNITY)
Admission: AD | Admit: 2014-05-25 | Discharge: 2014-05-25 | Disposition: A | Discharge status: Home / Self Care | Payer: PRIVATE HEALTH INSURANCE | Source: Ambulatory Visit | Attending: Obstetrics and Gynecology | Admitting: Obstetrics and Gynecology

## 2014-05-25 DIAGNOSIS — R109 Unspecified abdominal pain: Secondary | ICD-10-CM | POA: Insufficient documentation

## 2014-05-25 DIAGNOSIS — Z87891 Personal history of nicotine dependence: Secondary | ICD-10-CM | POA: Insufficient documentation

## 2014-05-25 DIAGNOSIS — D259 Leiomyoma of uterus, unspecified: Secondary | ICD-10-CM | POA: Insufficient documentation

## 2014-05-25 DIAGNOSIS — N83209 Unspecified ovarian cyst, unspecified side: Secondary | ICD-10-CM | POA: Insufficient documentation

## 2014-05-25 DIAGNOSIS — M549 Dorsalgia, unspecified: Secondary | ICD-10-CM | POA: Insufficient documentation

## 2014-05-25 DIAGNOSIS — N949 Unspecified condition associated with female genital organs and menstrual cycle: Secondary | ICD-10-CM | POA: Insufficient documentation

## 2014-05-25 LAB — CBC
HCT: 37.8 % (ref 36.0–46.0)
HEMOGLOBIN: 12.5 g/dL (ref 12.0–15.0)
MCH: 28.7 pg (ref 26.0–34.0)
MCHC: 33.1 g/dL (ref 30.0–36.0)
MCV: 86.9 fL (ref 78.0–100.0)
Platelets: 238 10*3/uL (ref 150–400)
RBC: 4.35 MIL/uL (ref 3.87–5.11)
RDW: 13.5 % (ref 11.5–15.5)
WBC: 6.3 10*3/uL (ref 4.0–10.5)

## 2014-05-25 LAB — WET PREP, GENITAL
CLUE CELLS WET PREP: NONE SEEN
TRICH WET PREP: NONE SEEN
YEAST WET PREP: NONE SEEN

## 2014-05-25 LAB — URINALYSIS, ROUTINE W REFLEX MICROSCOPIC
BILIRUBIN URINE: NEGATIVE
GLUCOSE, UA: NEGATIVE mg/dL
Hgb urine dipstick: NEGATIVE
Ketones, ur: NEGATIVE mg/dL
Leukocytes, UA: NEGATIVE
Nitrite: NEGATIVE
PROTEIN: NEGATIVE mg/dL
Specific Gravity, Urine: 1.01 (ref 1.005–1.030)
UROBILINOGEN UA: 0.2 mg/dL (ref 0.0–1.0)
pH: 7 (ref 5.0–8.0)

## 2014-05-25 LAB — POCT PREGNANCY, URINE: Preg Test, Ur: NEGATIVE

## 2014-05-25 MED ORDER — KETOROLAC TROMETHAMINE 10 MG PO TABS: 10.0000 mg | ORAL_TABLET | Freq: Four times a day (QID) | ORAL | Status: DC | PRN

## 2014-05-25 MED ORDER — KETOROLAC TROMETHAMINE 60 MG/2ML IM SOLN
60.0000 mg | Freq: Once | INTRAMUSCULAR | Status: AC
  Administered 2014-05-25: 60 mg via INTRAMUSCULAR
  Filled 2014-05-25: qty 2

## 2014-05-25 NOTE — Discharge Instructions (Signed)
Ovarian Cyst °An ovarian cyst is a fluid-filled sac that forms on an ovary. The ovaries are small organs that produce eggs in women. Various types of cysts can form on the ovaries. Most are not cancerous. Many do not cause problems, and they often go away on their own. Some may cause symptoms and require treatment. Common types of ovarian cysts include: °· Functional cysts These cysts may occur every month during the menstrual cycle. This is normal. The cysts usually go away with the next menstrual cycle if the woman does not get pregnant. Usually, there are no symptoms with a functional cyst. °· Endometrioma cysts These cysts form from the tissue that lines the uterus. They are also called "chocolate cysts" because they become filled with blood that turns brown. This type of cyst can cause pain in the lower abdomen during intercourse and with your menstrual period. °· Cystadenoma cysts This type develops from the cells on the outside of the ovary. These cysts can get very big and cause lower abdomen pain and pain with intercourse. This type of cyst can twist on itself, cut off its blood supply, and cause severe pain. It can also easily rupture and cause a lot of pain. °· Dermoid cysts This type of cyst is sometimes found in both ovaries. These cysts may contain different kinds of body tissue, such as skin, teeth, hair, or cartilage. They usually do not cause symptoms unless they get very big. °· Theca lutein cysts These cysts occur when too much of a certain hormone (human chorionic gonadotropin) is produced and overstimulates the ovaries to produce an egg. This is most common after procedures used to assist with the conception of a baby (in vitro fertilization). °CAUSES  °· Fertility drugs can cause a condition in which multiple large cysts are formed on the ovaries. This is called ovarian hyperstimulation syndrome. °· A condition called polycystic ovary syndrome can cause hormonal imbalances that can lead to  nonfunctional ovarian cysts. °SIGNS AND SYMPTOMS  °Many ovarian cysts do not cause symptoms. If symptoms are present, they may include: °· Pelvic pain or pressure. °· Pain in the lower abdomen. °· Pain during sexual intercourse. °· Increasing girth (swelling) of the abdomen. °· Abnormal menstrual periods. °· Increasing pain with menstrual periods. °· Stopping having menstrual periods without being pregnant. °DIAGNOSIS  °These cysts are commonly found during a routine or annual pelvic exam. Tests may be ordered to find out more about the cyst. These tests may include: °· Ultrasound. °· X-ray of the pelvis. °· CT scan. °· MRI. °· Blood tests. °TREATMENT  °Many ovarian cysts go away on their own without treatment. Your health care provider may want to check your cyst regularly for 2 3 months to see if it changes. For women in menopause, it is particularly important to monitor a cyst closely because of the higher rate of ovarian cancer in menopausal women. When treatment is needed, it may include any of the following: °· A procedure to drain the cyst (aspiration). This may be done using a long needle and ultrasound. It can also be done through a laparoscopic procedure. This involves using a thin, lighted tube with a tiny camera on the end (laparoscope) inserted through a small incision. °· Surgery to remove the whole cyst. This may be done using laparoscopic surgery or an open surgery involving a larger incision in the lower abdomen. °· Hormone treatment or birth control pills. These methods are sometimes used to help dissolve a cyst. °HOME CARE   INSTRUCTIONS   Only take over-the-counter or prescription medicines as directed by your health care provider.  Follow up with your health care provider as directed.  Get regular pelvic exams and Pap tests. SEEK MEDICAL CARE IF:   Your periods are late, irregular, or painful, or they stop.  Your pelvic pain or abdominal pain does not go away.  Your abdomen becomes  larger or swollen.  You have pressure on your bladder or trouble emptying your bladder completely.  You have pain during sexual intercourse.  You have feelings of fullness, pressure, or discomfort in your stomach.  You lose weight for no apparent reason.  You feel generally ill.  You become constipated.  You lose your appetite.  You develop acne.  You have an increase in body and facial hair.  You are gaining weight, without changing your exercise and eating habits.  You think you are pregnant. SEEK IMMEDIATE MEDICAL CARE IF:   You have increasing abdominal pain.  You feel sick to your stomach (nauseous), and you throw up (vomit).  You develop a fever that comes on suddenly.  You have abdominal pain during a bowel movement.  Your menstrual periods become heavier than usual. Document Released: 11/28/2005 Document Revised: 09/18/2013 Document Reviewed: 08/05/2013 [facility] Patient Information 2014 [place]. Ketorolac tablets What is this medicine? KETOROLAC (kee toe ROLE ak) is a non-steroidal anti-inflammatory drug (NSAID). It is used for a short while to treat moderate to severe pain, including pain after surgery. It should not be used for more than 7 days. This medicine may be used for other purposes; ask your health care provider or pharmacist if you have questions. COMMON BRAND NAME(S): Toradol What should I tell my health care provider before I take this medicine? They need to know if you have any of these conditions: -asthma -bleeding problems like hemophilia -cigarette smoker -drink more than 3 alcohol containing drinks a day -heart disease or circulation problems such as heart failure or leg edema (fluid retention) -high blood pressure -kidney disease -liver disease -stomach bleeding or ulcers -an unusual or allergic reaction to ketorolac, aspirin, other NSAIDs, other medicines, foods, dyes, or preservatives -pregnant or trying to get  pregnant -breast-feeding How should I use this medicine? Take this medicine by mouth with a full glass of water. Follow the directions on the prescription label. Take your medicine at regular intervals. Do not take your medicine more often than directed. Do not take more than the recommended dose. A special MedGuide will be given to you by the pharmacist with each prescription and refill. Be sure to read this information carefully each time. Talk to your pediatrician regarding the use of this medicine in children. While this drug may be prescribed for children as young as 47 years of age for selected conditions, precautions do apply. Patients over 73 years old may have a stronger reaction and need a smaller dose. Overdosage: If you think you have taken too much of this medicine contact a poison control center or emergency room at once. NOTE: This medicine is only for you. Do not share this medicine with others. What if I miss a dose? If you miss a dose, take it as soon as you can. If it is almost time for your next dose, take only that dose. Do not take double or extra doses. What may interact with this medicine? Do not take this medicine with any of the following medications: -aspirin and aspirin-like medicines -cidofovir -methotrexate -NSAIDs, medicines for pain and inflammation,  like ibuprofen or naproxen -pemetrexed -probenecid This medicine may also interact with the following medications: -alcohol -alendronate -alprazolam -carbamazepine -cyclosporine -diuretics -flavocoxid -fluoxetine -ginkgo -lithium -medicines for high blood pressure like enalapril -medicines that affect platelets like pentoxifylline -medicines that treat or prevent blood clots like heparin, warfarin -muscle relaxants -phenytoin -steroid medicines like prednisone or cortisone -thiothixene This list may not describe all possible interactions. Give your health care provider a list of all the medicines,  herbs, non-prescription drugs, or dietary supplements you use. Also tell them if you smoke, drink alcohol, or use illegal drugs. Some items may interact with your medicine. What should I watch for while using this medicine? Tell your doctor or health care professional if your pain does not get better. Talk to your doctor before taking another medicine for pain. Do not treat yourself. This medicine does not prevent heart attack or stroke. In fact, this medicine may increase the chance of a heart attack or stroke. The chance may increase with longer use of this medicine and in people who have heart disease. If you take aspirin to prevent heart attack or stroke, talk with your doctor or health care professional. Do not take medicines such as ibuprofen and naproxen with this medicine. Side effects such as stomach upset, nausea, or ulcers may be more likely to occur. Many medicines available without a prescription should not be taken with this medicine. This medicine can cause ulcers and bleeding in the stomach and intestines at any time during treatment. Do not smoke cigarettes or drink alcohol. These increase irritation to your stomach and can make it more susceptible to damage from this medicine. Ulcers and bleeding can happen without warning symptoms and can cause death. You may get drowsy or dizzy. Do not drive, use machinery, or do anything that needs mental alertness until you know how this medicine affects you. Do not stand or sit up quickly, especially if you are an older patient. This reduces the risk of dizzy or fainting spells. This medicine can cause you to bleed more easily. Try to avoid damage to your teeth and gums when you brush or floss your teeth. What side effects may I notice from receiving this medicine? Side effects that you should report to your doctor or health care professional as soon as possible: -allergic reactions like skin rash, itching or hives, swelling of the face, lips, or  tongue -breathing problems -high blood pressure -nausea, vomiting -redness, blistering, peeling or loosening of the skin, including inside the mouth -severe stomach pain -signs and symptoms of bleeding such as bloody or black, tarry stools; red or dark-brown urine; spitting up blood or brown material that looks like coffee grounds; red spots on the skin; unusual bruising or bleeding from the eye, gums, or nose -signs and symptoms of a stroke like changes in vision; confusion; trouble speaking or understanding; severe headaches; sudden numbness or weakness of the face, arm or leg; trouble walking; dizziness; loss of balance or coordination -trouble passing urine or change in the amount of urine -unexplained weight gain or swelling -unusually weak or tired -yellowing of eyes or skin  Side effects that usually do not require medical attention (report to your doctor or health care professional if they continue or are bothersome): -diarrhea -dizziness -headache -heartburn This list may not describe all possible side effects. Call your doctor for medical advice about side effects. You may report side effects to FDA at 1-800-FDA-1088. Where should I keep my medicine? Keep out of the reach of  children. Store at room temperature between 20 and 25 degrees C (68 and 77 degrees F). Throw away any unused medicine after the expiration date. NOTE: This sheet is a summary. It may not cover all possible information. If you have questions about this medicine, talk to your doctor, pharmacist, or health care provider.  2014, [employer]vier/[employer] Standard. (2013-04-16 16:36:05)

## 2014-05-25 NOTE — MAU Note (Addendum)
38 yo, s/p D&C on 5/22 for miscarriage, reports progressively worsening pain from pubis radiating to RLQ into back. Reports dyspareunia. No VB.  Symptoms unrelieved with ibuprofen. Last BM 2-3 days ago.

## 2014-05-25 NOTE — MAU Provider Note (Signed)
History   Patient is a 38 y.o. [id number] who is s/p D&C on 05/02/2014 who presents, unannounced, complaining of constant right sided abdominal, pelvic, and back pain.  Patient states pain started a few days after her procedure and has intensified since seeing the chiropractor on 05/19/2014.  Patient was seen in the office on 05/20/2014 and bHcG levels were noted to be negative.  Patient states that she has been taking tylenol and ibuprofen 800mg  with no relief, and recently a muscle relaxer from the chiropractor.  Patient denies pain and/or issues with urination or bowel movements. However, patient does report increased vaginal pressure with bowel movements and dyspareunia.  Patient denies vaginal bleeding or discharge, as well as fever, chills, or n/v.    Patient Active Problem List   Diagnosis Date Noted  . GOITER, UNSPECIFIED 07/20/2010  . MIGRAINE HEADACHE 07/20/2010  . SEIZURE DISORDER, HX OF 07/20/2010  . [place], [place] OF 07/20/2010    Chief Complaint  Patient presents with  . Abdominal Pain  . Back Pain   HPI  OB History   Grav Para Term Preterm Abortions TAB SAB Ect Mult Living   4 1 1  2  2   1       Past Medical History  Diagnosis Date  . Hypothyroidism   . Seizures     last seizure 08/11/13  . PONV (postoperative nausea and vomiting)   . SVD (spontaneous vaginal delivery)     x 1  . Headache(784.0)   . Endometriosis   . Fibroid     Past Surgical History  Procedure Laterality Date  . Breast reduction surgery    . Ganglion cyst excision      right hand  . Dilation and evacuation N/A 11/25/2013    Procedure: DILATATION AND EVACUATION;  Surgeon: Woodroe Mode, MD;  Location: [facility];  Service: Gynecology;  Laterality: N/A;  . Dilation and curettage of uterus    . Dilation and evacuation N/A 11/30/2013    Procedure: DILATATION AND EVACUATION;  Surgeon: Jonnie Kind, MD;  Location: [facility];  Service: Gynecology;  Laterality: N/A;  . Dilation and evacuation N/A 05/02/2014     Procedure: DILATATION AND EVACUATION;  Surgeon: Ena Dawley, MD;  Location: [facility];  Service: Gynecology;  Laterality: N/A;    Family History  Problem Relation Age of Onset  . Lung cancer Maternal Grandmother   . Colon cancer Maternal Grandmother   . Cancer Other   . Diabetes Other   . High blood pressure Other   . Diabetes Mother   . Hypertension Father   . Hypertension Paternal Grandmother   . Diabetes Paternal Grandmother   . Heart disease Paternal Grandmother     History  Substance Use Topics  . Smoking status: Former Smoker -- 2 years    Types: Cigarettes    Quit date: 04/28/1997  . Smokeless tobacco: Never Used     Comment: smoked in HS  . Alcohol Use: No    Allergies:  Allergies  Allergen Reactions  . Other Anaphylaxis and Itching    cantaloupe  . Cocoa Butter Hives  . Codeine Other (See Comments)    seizures    Prescriptions prior to admission  Medication Sig Dispense Refill  . acetaminophen (TYLENOL) 325 MG tablet Take 325 mg by mouth every 6 (six) hours as needed for moderate pain (pain).       . baclofen (LIORESAL) 10 MG tablet Take 10 mg by mouth as needed for muscle spasms.      Marland Kitchen  Cholecalciferol (VITAMIN D-3) 5000 UNITS TABS Take 10,000 Units by mouth daily.      . folic acid (FOLVITE) 1 MG tablet Take 4 mg by mouth daily.      Marland Kitchen ibuprofen (ADVIL,MOTRIN) 800 MG tablet Take 1 tablet (800 mg total) by mouth every 8 (eight) hours as needed.  50 tablet  1  . Prenatal Vit-Fe Fumarate-FA (PRENATAL MULTIVITAMIN) TABS tablet Take 1 tablet by mouth at bedtime.      . Thyroid (NATURE-THROID) 48.75 MG TABS Take 1 tablet by mouth daily.        ROS  See HPI Above Physical Exam   Blood pressure 125/72, pulse 83, temperature 98.1 F (36.7 C), temperature source Oral, resp. rate 13, height 5\' 7"  (1.702 m), weight 268 lb (121.564 kg), last menstrual period 03/13/2014.  Results for orders placed during the hospital encounter of 05/25/14 (from the past 24  hour(s))  URINALYSIS, ROUTINE W REFLEX MICROSCOPIC     Status: None   Collection Time    05/25/14 10:20 AM      Result Value Ref Range   Color, Urine YELLOW  YELLOW   APPearance CLEAR  CLEAR   Specific Gravity, Urine 1.010  1.005 - 1.030   pH 7.0  5.0 - 8.0   Glucose, UA NEGATIVE  NEGATIVE mg/dL   Hgb urine dipstick NEGATIVE  NEGATIVE   Bilirubin Urine NEGATIVE  NEGATIVE   Ketones, ur NEGATIVE  NEGATIVE mg/dL   Protein, ur NEGATIVE  NEGATIVE mg/dL   Urobilinogen, UA 0.2  0.0 - 1.0 mg/dL   Nitrite NEGATIVE  NEGATIVE   Leukocytes, UA NEGATIVE  NEGATIVE  POCT PREGNANCY, URINE     Status: None   Collection Time    05/25/14 10:36 AM      Result Value Ref Range   Preg Test, Ur NEGATIVE  NEGATIVE  CBC     Status: None   Collection Time    05/25/14 10:55 AM      Result Value Ref Range   WBC 6.3  4.0 - 10.5 K/uL   RBC 4.35  3.87 - 5.11 MIL/uL   Hemoglobin 12.5  12.0 - 15.0 g/dL   HCT 37.8  36.0 - 46.0 %   MCV 86.9  78.0 - 100.0 fL   MCH 28.7  26.0 - 34.0 pg   MCHC 33.1  30.0 - 36.0 g/dL   RDW 13.5  11.5 - 15.5 %   Platelets 238  150 - 400 K/uL  WET PREP, GENITAL     Status: Abnormal   Collection Time    05/25/14 11:53 AM      Result Value Ref Range   Yeast Wet Prep HPF POC NONE SEEN  NONE SEEN   Trich, Wet Prep NONE SEEN  NONE SEEN   Clue Cells Wet Prep HPF POC NONE SEEN  NONE SEEN   WBC, Wet Prep HPF POC MODERATE (*) NONE SEEN    Physical Exam  Constitutional: Vital signs are normal. She appears well-developed.  Respiratory: Effort normal.  GI: Soft. Normal appearance and bowel sounds are normal. There is tenderness in the right upper quadrant, right lower quadrant, periumbilical area and suprapubic area. There is CVA tenderness. There is no rigidity and no guarding. No hernia.  Genitourinary: Rectum normal and uterus normal. Cervix exhibits motion tenderness and discharge. Right adnexum displays tenderness. No bleeding around the vagina. Vaginal discharge found.  Speculum  Exam: -Vaginal Vault: Thick white discharge noted-wet prep collected -Cervix: mucoid discharge from os-GC/CT collected -  Bimanual Exam: +CMT, Right sided tenderness on deep abdominal palpation-unable to assess ovaries.  Left ovary wnl.  Neurological: She is alert.  Skin: Skin is warm and dry.  Psychiatric: She has a normal mood and affect. Her speech is normal and behavior is normal.    ED Course  Assessment: 38 y.o. [id number] S/P D&C 5/22 Pelvic Pain Abdominal Pain  Plan: -Toradol IM, now, for pain -Labs: CBC, UA, Wet Prep, GC/CT -[name]: Pelvic and Renal  -Discussed plan with Dr. Florene Route   Follow Up (1414) -[name] complete with findings as below: Uterus: Measurements: 8.7 x 5.2 x 6.2 cm. Multiple uterine fibroids  measuring up to 1.8 cm noted.  Endometrium: Thickness: 9 mm. No focal abnormality visualized.  Right ovary: Measurements: 3.6 x 1.8 x 2.4 cm. Approximately 1.4 cm complex cyst  most likely partially ruptured hemorrhagic cyst. There is a adjacent  2.9 cm questionable ovarian versus paraovarian mass. Differential  diagnosis includes small hematoma, prominent fallopian tube, small  solid ovarian mass . Tubo-ovarian abscess or ectopic pregnancy could  present this fashion however this would be unlikely given the  patient's CBC and pregnancy tests are normal by report.  Left ovary: Measurements: 3.7 x 1.3 x 2.2 cm. Normal appearance/no adnexal mass.  Other findings: Small amount of free pelvic fluid.  IMPRESSION:  1. Fibroid uterus again noted.  2. 1.4 cm complex cyst right ovary, most likely partially ruptured  hemorrhagic cyst . There is an adjacent 2.9 cm questionable mass.  This may represent a focal area of hematoma. See differential  diagnosis above. A repeat pelvic ultrasound in 4-6 weeks to  demonstrate resolution suggested. Renal US-Negative -Discussed results with Dr. Johnette Abraham [place]---Contacted radiologist regarding mass.  Informed that ectopic unlikely.  Will  monitor, closely -Rx for PO Toradol  -Precautions given for pain mgmt--call if pain worsens or unrelieved with medication.  -Precautions given for s/s of infection or worsening of symptoms -Follow up in 1 week at [facility], [place], MSN 05/25/2014 11:35 AM

## 2014-05-26 LAB — GC/CHLAMYDIA PROBE AMP
CT Probe RNA: NEGATIVE
GC PROBE AMP APTIMA: NEGATIVE

## 2014-06-18 ENCOUNTER — Encounter (HOSPITAL_COMMUNITY): Payer: Self-pay | Admitting: Emergency Medicine

## 2014-06-18 ENCOUNTER — Emergency Department (HOSPITAL_COMMUNITY)
Admission: EM | Admit: 2014-06-18 | Discharge: 2014-06-18 | Disposition: A | Discharge status: Home / Self Care | Payer: PRIVATE HEALTH INSURANCE | Attending: Emergency Medicine | Admitting: Emergency Medicine

## 2014-06-18 DIAGNOSIS — Z8669 Personal history of other diseases of the nervous system and sense organs: Secondary | ICD-10-CM | POA: Insufficient documentation

## 2014-06-18 DIAGNOSIS — Y929 Unspecified place or not applicable: Secondary | ICD-10-CM | POA: Insufficient documentation

## 2014-06-18 DIAGNOSIS — S0003XA Contusion of scalp, initial encounter: Secondary | ICD-10-CM | POA: Insufficient documentation

## 2014-06-18 DIAGNOSIS — Z87891 Personal history of nicotine dependence: Secondary | ICD-10-CM | POA: Insufficient documentation

## 2014-06-18 DIAGNOSIS — S1093XA Contusion of unspecified part of neck, initial encounter: Principal | ICD-10-CM

## 2014-06-18 DIAGNOSIS — IMO0002 Reserved for concepts with insufficient information to code with codable children: Secondary | ICD-10-CM | POA: Insufficient documentation

## 2014-06-18 DIAGNOSIS — S0083XA Contusion of other part of head, initial encounter: Principal | ICD-10-CM | POA: Insufficient documentation

## 2014-06-18 DIAGNOSIS — Y9389 Activity, other specified: Secondary | ICD-10-CM | POA: Insufficient documentation

## 2014-06-18 DIAGNOSIS — Z8742 Personal history of other diseases of the female genital tract: Secondary | ICD-10-CM | POA: Insufficient documentation

## 2014-06-18 DIAGNOSIS — E039 Hypothyroidism, unspecified: Secondary | ICD-10-CM | POA: Insufficient documentation

## 2014-06-18 DIAGNOSIS — Z79899 Other long term (current) drug therapy: Secondary | ICD-10-CM | POA: Insufficient documentation

## 2014-06-18 MED ORDER — IBUPROFEN 800 MG PO TABS: 800.0000 mg | ORAL_TABLET | Freq: Three times a day (TID) | ORAL | Status: DC

## 2014-06-18 MED ORDER — HYDROCODONE-ACETAMINOPHEN 5-325 MG PO TABS
1.0000 | ORAL_TABLET | Freq: Once | ORAL | Status: AC
  Administered 2014-06-18: 1 via ORAL
  Filled 2014-06-18: qty 1

## 2014-06-18 MED ORDER — HYDROCODONE-ACETAMINOPHEN 5-325 MG PO TABS: 1.0000 | ORAL_TABLET | ORAL | Status: DC | PRN

## 2014-06-18 NOTE — ED Notes (Signed)
Pt states that she was at work at hit her head on a metal drawer. Pt has some swelling to the rt upper forehead. No laceration noted. Pt denies LOC. Pt is A&Ox4.

## 2014-06-18 NOTE — ED Provider Notes (Signed)
CSN: [id number]     Arrival date & time 06/18/14  2211 History  This chart was scribed for non-physician practitioner, Charlann Lange, PA-C,working with Jasper Riling. Alvino Chapel, MD, by Marlowe Kays, ED Scribe.  This patient was seen in room WTR8/WTR8 and the patient's care was started at 11:05 PM.   Chief Complaint  Patient presents with  . Head Injury   The history is provided by the patient. No language interpreter was used.   HPI Comments:  Sharon Benson is a 38 y.o. obese female who presents to the Emergency Department complaining of hitting her head on a metal drawer while at work earlier today. She reports severe pain and throbbing HA since. She states she finished her shift without issue. She has not taken anything for pain. She denies LOC, nausea, or vomiting.  Past Medical History  Diagnosis Date  . Hypothyroidism   . Seizures     last seizure 08/11/13  . PONV (postoperative nausea and vomiting)   . SVD (spontaneous vaginal delivery)     x 1  . Headache(784.0)   . Endometriosis   . Fibroid    Past Surgical History  Procedure Laterality Date  . Breast reduction surgery    . Ganglion cyst excision      right hand  . Dilation and evacuation N/A 11/25/2013    Procedure: DILATATION AND EVACUATION;  Surgeon: Woodroe Mode, MD;  Location: [facility];  Service: Gynecology;  Laterality: N/A;  . Dilation and curettage of uterus    . Dilation and evacuation N/A 11/30/2013    Procedure: DILATATION AND EVACUATION;  Surgeon: Jonnie Kind, MD;  Location: [facility];  Service: Gynecology;  Laterality: N/A;  . Dilation and evacuation N/A 05/02/2014    Procedure: DILATATION AND EVACUATION;  Surgeon: Ena Dawley, MD;  Location: [facility];  Service: Gynecology;  Laterality: N/A;   Family History  Problem Relation Age of Onset  . Lung cancer Maternal Grandmother   . Colon cancer Maternal Grandmother   . Cancer Other   . Diabetes Other   . High blood pressure Other   . Diabetes Mother   .  Hypertension Father   . Hypertension Paternal Grandmother   . Diabetes Paternal Grandmother   . Heart disease Paternal Grandmother    History  Substance Use Topics  . Smoking status: Former Smoker -- 2 years    Types: Cigarettes    Quit date: 04/28/1997  . Smokeless tobacco: Never Used     Comment: smoked in HS  . Alcohol Use: No   OB History   Grav Para Term Preterm Abortions TAB SAB Ect Mult Living   4 1 1  2  2   1      Review of Systems  Skin: Negative for wound.  Neurological: Positive for headaches. Negative for syncope.  All other systems reviewed and are negative.   Allergies  Other; Cocoa butter; and Codeine  Home Medications   Prior to Admission medications   Medication Sig Start Date End Date Taking? Authorizing Provider  acetaminophen (TYLENOL) 325 MG tablet Take 325 mg by mouth every 6 (six) hours as needed for moderate pain (pain).     Historical Provider, MD  Cholecalciferol (VITAMIN D-3) 5000 UNITS TABS Take 10,000 Units by mouth daily.    Historical Provider, MD  folic acid (FOLVITE) 1 MG tablet Take 4 mg by mouth daily.    Historical Provider, MD  ketorolac (TORADOL) 10 MG tablet Take 1 tablet (10 mg total)  by mouth every 6 (six) hours as needed for moderate pain. 05/25/14   Gavin Pound, CNM  Prenatal Vit-Fe Fumarate-FA (PRENATAL MULTIVITAMIN) TABS tablet Take 1 tablet by mouth at bedtime.    Historical Provider, MD  Thyroid (NATURE-THROID) 48.75 MG TABS Take 1 tablet by mouth daily.    Historical Provider, MD   Triage Vitals: BP 121/73  Pulse 89  Temp(Src) 98.1 F (36.7 C) (Oral)  Resp 18  SpO2 100%  LMP 06/09/2014  Breastfeeding? Unknown Physical Exam  Nursing note and vitals reviewed. Constitutional: She is oriented to person, place, and time. She appears well-developed and well-nourished.  HENT:  Head: Normocephalic.  Small hematoma on right frontal scalp at hairline with no wound. Moderately tender. No discoloration.  Eyes: EOM are normal.   Neck: Normal range of motion.  Cardiovascular: Normal rate.   Pulmonary/Chest: Effort normal.  Musculoskeletal: Normal range of motion.  No cervical tenderness.  Neurological: She is alert and oriented to person, place, and time.  Skin: Skin is warm and dry.  Psychiatric: She has a normal mood and affect. Her behavior is normal.    ED Course  Procedures (including critical care time) DIAGNOSTIC STUDIES: Oxygen Saturation is 100% on RA, normal by my interpretation.   COORDINATION OF CARE: 11:08 PM- Will prescribe Motrin and give ice pack. Pt verbalizes understanding and agrees to plan.  Medications - No data to display  Labs Review Labs Reviewed - No data to display  Imaging Review No results found.   EKG Interpretation None      MDM   Final diagnoses:  None    1. Scalp contusion  No IC injury suspected with minimal impact injury. Will treat supportively with pain management.  I personally performed the services described in this documentation, which was scribed in my presence. The recorded information has been reviewed and is accurate.    Dewaine Oats, PA-C 06/18/14 2321

## 2014-06-18 NOTE — Discharge Instructions (Signed)
Contusion A contusion is a deep bruise. Contusions are the result of an injury that caused bleeding under the skin. The contusion may turn blue, purple, or yellow. Minor injuries will give you a painless contusion, but more severe contusions may stay painful and swollen for a few weeks.  CAUSES  A contusion is usually caused by a blow, trauma, or direct force to an area of the body. SYMPTOMS   Swelling and redness of the injured area.  Bruising of the injured area.  Tenderness and soreness of the injured area.  Pain. DIAGNOSIS  The diagnosis can be made by taking a history and physical exam. An X-ray, CT scan, or MRI may be needed to determine if there were any associated injuries, such as fractures. TREATMENT  Specific treatment will depend on what area of the body was injured. In general, the best treatment for a contusion is resting, icing, elevating, and applying cold compresses to the injured area. Over-the-counter medicines may also be recommended for pain control. Ask your caregiver what the best treatment is for your contusion. HOME CARE INSTRUCTIONS   Put ice on the injured area.  Put ice in a plastic bag.  Place a towel between your skin and the bag.  Leave the ice on for 15-20 minutes, 3-4 times a day, or as directed by your health care provider.  Only take over-the-counter or prescription medicines for pain, discomfort, or fever as directed by your caregiver. Your caregiver may recommend avoiding anti-inflammatory medicines (aspirin, ibuprofen, and naproxen) for 48 hours because these medicines may increase bruising.  Rest the injured area.  If possible, elevate the injured area to reduce swelling. SEEK IMMEDIATE MEDICAL CARE IF:   You have increased bruising or swelling.  You have pain that is getting worse.  Your swelling or pain is not relieved with medicines. MAKE SURE YOU:   Understand these instructions.  Will watch your condition.  Will get help right  away if you are not doing well or get worse. Document Released: 09/07/2005 Document Revised: 12/03/2013 Document Reviewed: 10/03/2011 [facility] Patient Information 2015 [place], [place]. This information is not intended to replace advice given to you by your health care provider. Make sure you discuss any questions you have with your health care provider.  Cryotherapy Cryotherapy means treatment with cold. Ice or gel packs can be used to reduce both pain and swelling. Ice is the most helpful within the first 24 to 48 hours after an injury or flareup from overusing a muscle or joint. Sprains, strains, spasms, burning pain, shooting pain, and aches can all be eased with ice. Ice can also be used when recovering from surgery. Ice is effective, has very few side effects, and is safe for most people to use. PRECAUTIONS  Ice is not a safe treatment option for people with:  Raynaud's phenomenon. This is a condition affecting small blood vessels in the extremities. Exposure to cold may cause your problems to return.  Cold hypersensitivity. There are many forms of cold hypersensitivity, including:  Cold urticaria. Red, itchy hives appear on the skin when the tissues begin to warm after being iced.  Cold erythema. This is a red, itchy rash caused by exposure to cold.  Cold hemoglobinuria. Red blood cells break down when the tissues begin to warm after being iced. The hemoglobin that carry oxygen are passed into the urine because they cannot combine with blood proteins fast enough.  Numbness or altered sensitivity in the area being iced. If you have any  of the following conditions, do not use ice until you have discussed cryotherapy with your caregiver:  Heart conditions, such as arrhythmia, angina, or chronic heart disease.  High blood pressure.  Healing wounds or open skin in the area being iced.  Current infections.  Rheumatoid arthritis.  Poor circulation.  Diabetes. Ice slows the blood  flow in the region it is applied. This is beneficial when trying to stop inflamed tissues from spreading irritating chemicals to surrounding tissues. However, if you expose your skin to cold temperatures for too long or without the proper protection, you can damage your skin or nerves. Watch for signs of skin damage due to cold. HOME CARE INSTRUCTIONS Follow these tips to use ice and cold packs safely.  Place a dry or damp towel between the ice and skin. A damp towel will cool the skin more quickly, so you may need to shorten the time that the ice is used.  For a more rapid response, add gentle compression to the ice.  Ice for no more than 10 to 20 minutes at a time. The bonier the area you are icing, the less time it will take to get the benefits of ice.  Check your skin after 5 minutes to make sure there are no signs of a poor response to cold or skin damage.  Rest 20 minutes or more in between uses.  Once your skin is numb, you can end your treatment. You can test numbness by very lightly touching your skin. The touch should be so light that you do not see the skin dimple from the pressure of your fingertip. When using ice, most people will feel these normal sensations in this order: cold, burning, aching, and numbness.  Do not use ice on someone who cannot communicate their responses to pain, such as small children or people with dementia. HOW TO MAKE AN ICE PACK Ice packs are the most common way to use ice therapy. Other methods include ice massage, ice baths, and cryo-sprays. Muscle creams that cause a cold, tingly feeling do not offer the same benefits that ice offers and should not be used as a substitute unless recommended by your caregiver. To make an ice pack, do one of the following:  Place crushed ice or a bag of frozen vegetables in a sealable plastic bag. Squeeze out the excess air. Place this bag inside another plastic bag. Slide the bag into a pillowcase or place a damp towel  between your skin and the bag.  Mix 3 parts water with 1 part rubbing alcohol. Freeze the mixture in a sealable plastic bag. When you remove the mixture from the freezer, it will be slushy. Squeeze out the excess air. Place this bag inside another plastic bag. Slide the bag into a pillowcase or place a damp towel between your skin and the bag. SEEK MEDICAL CARE IF:  You develop white spots on your skin. This may give the skin a blotchy (mottled) appearance.  Your skin turns blue or pale.  Your skin becomes waxy or hard.  Your swelling gets worse. MAKE SURE YOU:   Understand these instructions.  Will watch your condition.  Will get help right away if you are not doing well or get worse. Document Released: 07/25/2011 Document Revised: 02/20/2012 Document Reviewed: 07/25/2011 [facility] Patient Information 2015 [place], [place]. This information is not intended to replace advice given to you by your health care provider. Make sure you discuss any questions you have with your health care provider.

## 2014-06-18 NOTE — ED Provider Notes (Signed)
Medical screening examination/treatment/procedure(s) were performed by non-physician practitioner and as supervising physician I was immediately available for consultation/collaboration.   EKG Interpretation None       Jasper Riling. Alvino Chapel, MD 06/18/14 2350

## 2014-06-18 NOTE — ED Notes (Signed)
Pt ambulatory to exam room with steady gait.  

## 2014-06-18 NOTE — ED Notes (Signed)
Pt has a ride home.  

## 2014-06-29 ENCOUNTER — Emergency Department (HOSPITAL_COMMUNITY)
Admission: EM | Admit: 2014-06-29 | Discharge: 2014-06-29 | Disposition: A | Discharge status: Home / Self Care | Payer: PRIVATE HEALTH INSURANCE | Attending: Emergency Medicine | Admitting: Emergency Medicine

## 2014-06-29 ENCOUNTER — Encounter (HOSPITAL_COMMUNITY): Payer: Self-pay | Admitting: Emergency Medicine

## 2014-06-29 DIAGNOSIS — E039 Hypothyroidism, unspecified: Secondary | ICD-10-CM | POA: Insufficient documentation

## 2014-06-29 DIAGNOSIS — L0291 Cutaneous abscess, unspecified: Secondary | ICD-10-CM

## 2014-06-29 DIAGNOSIS — L0390 Cellulitis, unspecified: Secondary | ICD-10-CM

## 2014-06-29 DIAGNOSIS — Z791 Long term (current) use of non-steroidal anti-inflammatories (NSAID): Secondary | ICD-10-CM | POA: Insufficient documentation

## 2014-06-29 DIAGNOSIS — G8911 Acute pain due to trauma: Secondary | ICD-10-CM | POA: Insufficient documentation

## 2014-06-29 DIAGNOSIS — L03818 Cellulitis of other sites: Principal | ICD-10-CM

## 2014-06-29 DIAGNOSIS — L02818 Cutaneous abscess of other sites: Secondary | ICD-10-CM | POA: Insufficient documentation

## 2014-06-29 DIAGNOSIS — Z8669 Personal history of other diseases of the nervous system and sense organs: Secondary | ICD-10-CM | POA: Insufficient documentation

## 2014-06-29 DIAGNOSIS — Z79899 Other long term (current) drug therapy: Secondary | ICD-10-CM | POA: Insufficient documentation

## 2014-06-29 DIAGNOSIS — Z8742 Personal history of other diseases of the female genital tract: Secondary | ICD-10-CM | POA: Insufficient documentation

## 2014-06-29 DIAGNOSIS — Z87891 Personal history of nicotine dependence: Secondary | ICD-10-CM | POA: Insufficient documentation

## 2014-06-29 MED ORDER — TRAMADOL HCL 50 MG PO TABS
50.0000 mg | ORAL_TABLET | Freq: Once | ORAL | Status: AC
  Administered 2014-06-29: 50 mg via ORAL
  Filled 2014-06-29: qty 1

## 2014-06-29 MED ORDER — TRAMADOL HCL 50 MG PO TABS
50.0000 mg | ORAL_TABLET | Freq: Four times a day (QID) | ORAL | Status: DC | PRN
Start: 2014-06-29 — End: 2015-01-03

## 2014-06-29 MED ORDER — SULFAMETHOXAZOLE-TRIMETHOPRIM 800-160 MG PO TABS: 1.0000 | ORAL_TABLET | Freq: Two times a day (BID) | ORAL | Status: DC

## 2014-06-29 NOTE — ED Provider Notes (Signed)
Medical screening examination/treatment/procedure(s) were performed by non-physician practitioner and as supervising physician I was immediately available for consultation/collaboration.   EKG Interpretation None        Blanchard Kelch, MD 06/29/14 [phone]

## 2014-06-29 NOTE — ED Notes (Signed)
Pt c/o headache x 11 days and cyst x "5 or so years."  Pain score 8/10.  Pt reports hitting her head x 11 days ago and since, the cyst has gotten bigger and began draining last night.  Pt was seen at [facility] previously for headache.  Reports taking ibuprofen w/o relief.

## 2014-06-29 NOTE — Discharge Instructions (Signed)
Take Bactrim as directed until gone. Apply warm compresses to the affected area. Refer to attached documents for more information. Take Tramadol as needed for pain. Return to the ED with worsening or concerning symptoms.

## 2014-06-29 NOTE — ED Provider Notes (Signed)
CSN: [id number]     Arrival date & time 06/29/14  1111 History  This chart was scribed for non-physician practitioner, Alvina Chou, PA-C working with Blanchard Kelch, MD by Frederich Balding, ED scribe. This patient was seen in room WTR8/WTR8 and the patient's care was started at 12:02 PM.   Chief Complaint  Patient presents with  . Headache  . Cyst   The history is provided by the patient. No language interpreter was used.   HPI Comments: Sharon Benson is a 38 y.o. female who presents to the Emergency Department complaining of continued headache that started 11 days ago after hitting her head. Pt is also complaining of a cyst that she's had for about 5 years. She states she hit her head where the cyst was and it has since gotten larger. States she noticed drainage from it last night. Pt has taken ibuprofen with no relief.   Past Medical History  Diagnosis Date  . Hypothyroidism   . Seizures     last seizure 08/11/13  . PONV (postoperative nausea and vomiting)   . SVD (spontaneous vaginal delivery)     x 1  . Headache(784.0)   . Endometriosis   . Fibroid    Past Surgical History  Procedure Laterality Date  . Breast reduction surgery    . Ganglion cyst excision      right hand  . Dilation and evacuation N/A 11/25/2013    Procedure: DILATATION AND EVACUATION;  Surgeon: Woodroe Mode, MD;  Location: [facility];  Service: Gynecology;  Laterality: N/A;  . Dilation and curettage of uterus    . Dilation and evacuation N/A 11/30/2013    Procedure: DILATATION AND EVACUATION;  Surgeon: Jonnie Kind, MD;  Location: [facility];  Service: Gynecology;  Laterality: N/A;  . Dilation and evacuation N/A 05/02/2014    Procedure: DILATATION AND EVACUATION;  Surgeon: Ena Dawley, MD;  Location: [facility];  Service: Gynecology;  Laterality: N/A;   Family History  Problem Relation Age of Onset  . Lung cancer Maternal Grandmother   . Colon cancer Maternal Grandmother   . Cancer Other   .  Diabetes Other   . High blood pressure Other   . Diabetes Mother   . Hypertension Father   . Hypertension Paternal Grandmother   . Diabetes Paternal Grandmother   . Heart disease Paternal Grandmother    History  Substance Use Topics  . Smoking status: Former Smoker -- 2 years    Types: Cigarettes    Quit date: 04/28/1997  . Smokeless tobacco: Never Used     Comment: smoked in HS  . Alcohol Use: No   OB History   Grav Para Term Preterm Abortions TAB SAB Ect Mult Living   4 1 1  2  2   1      Review of Systems  Skin:       Cyst.  Neurological: Positive for headaches.  All other systems reviewed and are negative.  Allergies  Other; Cocoa butter; and Codeine  Home Medications   Prior to Admission medications   Medication Sig Start Date End Date Taking? Authorizing Provider  acetaminophen (TYLENOL) 325 MG tablet Take 325 mg by mouth every 6 (six) hours as needed for moderate pain (pain).     Historical Provider, MD  Cholecalciferol (VITAMIN D-3) 5000 UNITS TABS Take 10,000 Units by mouth daily.    Historical Provider, MD  folic acid (FOLVITE) 1 MG tablet Take 4 mg by mouth daily.  Historical Provider, MD  HYDROcodone-acetaminophen (NORCO/VICODIN) 5-325 MG per tablet Take 1-2 tablets by mouth every 4 (four) hours as needed. 06/18/14   Shari A Upstill, PA-C  ibuprofen (ADVIL,MOTRIN) 800 MG tablet Take 1 tablet (800 mg total) by mouth 3 (three) times daily. 06/18/14   Shari A Upstill, PA-C  ketorolac (TORADOL) 10 MG tablet Take 1 tablet (10 mg total) by mouth every 6 (six) hours as needed for moderate pain. 05/25/14   Gavin Pound, CNM  Prenatal Vit-Fe Fumarate-FA (PRENATAL MULTIVITAMIN) TABS tablet Take 1 tablet by mouth at bedtime.    Historical Provider, MD  Thyroid (NATURE-THROID) 48.75 MG TABS Take 1 tablet by mouth daily.    Historical Provider, MD   BP 112/62  Pulse 64  Temp(Src) 98.2 F (36.8 C) (Oral)  Resp 16  SpO2 99%  LMP 06/09/2014  Physical Exam  Nursing note  and vitals reviewed. Constitutional: She is oriented to person, place, and time. She appears well-developed and well-nourished. No distress.  HENT:  Head: Normocephalic and atraumatic.  Eyes: Conjunctivae and EOM are normal.  Neck: Neck supple. No tracheal deviation present.  Cardiovascular: Normal rate.   Pulmonary/Chest: Effort normal. No respiratory distress.  Musculoskeletal: Normal range of motion.  Neurological: She is alert and oriented to person, place, and time.  Skin: Skin is warm and dry.  1 x 1 cm area of fluctuance with drainage noted of right frontal scalp along the hairline. Area is tender to palpate.  Psychiatric: She has a normal mood and affect. Her behavior is normal.    ED Course  Procedures (including critical care time)  DIAGNOSTIC STUDIES: Oxygen Saturation is 99% on RA, normal by my interpretation.    COORDINATION OF CARE: 12:05 PM-Discussed treatment plan which includes an antibiotic and pain medication with pt at bedside and pt agreed to plan. Pt states she does not want I&D done.   Labs Review Labs Reviewed - No data to display  Imaging Review No results found.   EKG Interpretation None      MDM   Final diagnoses:  Abscess and cellulitis    Patient has a draining abscess of her right frontal area. Patient will be discharged with bactrim and tramadol for pain. Vitals stable and patient afebrile. No further evaluation needed at this time.   I personally performed the services described in this documentation, which was scribed in my presence. The recorded information has been reviewed and is accurate.  Alvina Chou, PA-C 06/29/14 1557

## 2014-10-13 ENCOUNTER — Encounter (HOSPITAL_COMMUNITY): Payer: Self-pay | Admitting: Emergency Medicine

## 2014-11-25 ENCOUNTER — Other Ambulatory Visit: Payer: Self-pay | Admitting: Otolaryngology

## 2014-12-12 ENCOUNTER — Encounter (HOSPITAL_COMMUNITY): Payer: Self-pay | Admitting: Emergency Medicine

## 2014-12-12 ENCOUNTER — Emergency Department (HOSPITAL_COMMUNITY): Payer: PRIVATE HEALTH INSURANCE

## 2014-12-12 ENCOUNTER — Emergency Department (HOSPITAL_COMMUNITY)
Admission: EM | Admit: 2014-12-12 | Discharge: 2014-12-12 | Disposition: A | Discharge status: Home / Self Care | Payer: PRIVATE HEALTH INSURANCE | Attending: Emergency Medicine | Admitting: Emergency Medicine

## 2014-12-12 DIAGNOSIS — E039 Hypothyroidism, unspecified: Secondary | ICD-10-CM | POA: Insufficient documentation

## 2014-12-12 DIAGNOSIS — S93401A Sprain of unspecified ligament of right ankle, initial encounter: Secondary | ICD-10-CM | POA: Insufficient documentation

## 2014-12-12 DIAGNOSIS — Y9389 Activity, other specified: Secondary | ICD-10-CM | POA: Insufficient documentation

## 2014-12-12 DIAGNOSIS — Y9289 Other specified places as the place of occurrence of the external cause: Secondary | ICD-10-CM | POA: Insufficient documentation

## 2014-12-12 DIAGNOSIS — Z87891 Personal history of nicotine dependence: Secondary | ICD-10-CM | POA: Insufficient documentation

## 2014-12-12 DIAGNOSIS — Z79899 Other long term (current) drug therapy: Secondary | ICD-10-CM | POA: Insufficient documentation

## 2014-12-12 DIAGNOSIS — Z8669 Personal history of other diseases of the nervous system and sense organs: Secondary | ICD-10-CM | POA: Insufficient documentation

## 2014-12-12 DIAGNOSIS — Z791 Long term (current) use of non-steroidal anti-inflammatories (NSAID): Secondary | ICD-10-CM | POA: Insufficient documentation

## 2014-12-12 DIAGNOSIS — X58XXXA Exposure to other specified factors, initial encounter: Secondary | ICD-10-CM | POA: Insufficient documentation

## 2014-12-12 DIAGNOSIS — Y990 Civilian activity done for income or pay: Secondary | ICD-10-CM | POA: Insufficient documentation

## 2014-12-12 DIAGNOSIS — Z8742 Personal history of other diseases of the female genital tract: Secondary | ICD-10-CM | POA: Insufficient documentation

## 2014-12-12 MED ORDER — IBUPROFEN 800 MG PO TABS
800.0000 mg | ORAL_TABLET | Freq: Once | ORAL | Status: AC
  Administered 2014-12-12: 800 mg via ORAL
  Filled 2014-12-12: qty 1

## 2014-12-12 MED ORDER — HYDROCODONE-ACETAMINOPHEN 5-325 MG PO TABS: 1.0000 | ORAL_TABLET | ORAL | Status: DC | PRN

## 2014-12-12 MED ORDER — HYDROCODONE-ACETAMINOPHEN 5-325 MG PO TABS
1.0000 | ORAL_TABLET | Freq: Once | ORAL | Status: AC
Start: 2014-12-12 — End: 2014-12-12
  Administered 2014-12-12: 1 via ORAL
  Filled 2014-12-12: qty 1

## 2014-12-12 MED ORDER — IBUPROFEN 800 MG PO TABS: 800.0000 mg | ORAL_TABLET | Freq: Three times a day (TID) | ORAL | Status: DC

## 2014-12-12 NOTE — Discharge Instructions (Signed)
Ankle Sprain °An ankle sprain is an injury to the strong, fibrous tissues (ligaments) that hold the bones of your ankle joint together.  °CAUSES °An ankle sprain is usually caused by a fall or by twisting your ankle. Ankle sprains most commonly occur when you step on the outer edge of your foot, and your ankle turns inward. People who participate in sports are more prone to these types of injuries.  °SYMPTOMS  °· Pain in your ankle. The pain may be present at rest or only when you are trying to stand or walk. °· Swelling. °· Bruising. Bruising may develop immediately or within 1 to 2 days after your injury. °· Difficulty standing or walking, particularly when turning corners or changing directions. °DIAGNOSIS  °Your caregiver will ask you details about your injury and perform a physical exam of your ankle to determine if you have an ankle sprain. During the physical exam, your caregiver will press on and apply pressure to specific areas of your foot and ankle. Your caregiver will try to move your ankle in certain ways. An X-ray exam may be done to be sure a bone was not broken or a ligament did not separate from one of the bones in your ankle (avulsion fracture).  °TREATMENT  °Certain types of braces can help stabilize your ankle. Your caregiver can make a recommendation for this. Your caregiver may recommend the use of medicine for pain. If your sprain is severe, your caregiver may refer you to a surgeon who helps to restore function to parts of your skeletal system (orthopedist) or a physical therapist. °HOME CARE INSTRUCTIONS  °· Apply ice to your injury for 1-2 days or as directed by your caregiver. Applying ice helps to reduce inflammation and pain. °· Put ice in a plastic bag. °· Place a towel between your skin and the bag. °· Leave the ice on for 15-20 minutes at a time, every 2 hours while you are awake. °· Only take over-the-counter or prescription medicines for pain, discomfort, or fever as directed by  your caregiver. °· Elevate your injured ankle above the level of your heart as much as possible for 2-3 days. °· If your caregiver recommends crutches, use them as instructed. Gradually put weight on the affected ankle. Continue to use crutches or a cane until you can walk without feeling pain in your ankle. °· If you have a plaster splint, wear the splint as directed by your caregiver. Do not rest it on anything harder than a pillow for the first 24 hours. Do not put weight on it. Do not get it wet. You may take it off to take a shower or bath. °· You may have been given an elastic bandage to wear around your ankle to provide support. If the elastic bandage is too tight (you have numbness or tingling in your foot or your foot becomes cold and blue), adjust the bandage to make it comfortable. °· If you have an air splint, you may blow more air into it or let air out to make it more comfortable. You may take your splint off at night and before taking a shower or bath. Wiggle your toes in the splint several times per day to decrease swelling. °SEEK MEDICAL CARE IF:  °· You have rapidly increasing bruising or swelling. °· Your toes feel extremely cold or you lose feeling in your foot. °· Your pain is not relieved with medicine. °SEEK IMMEDIATE MEDICAL CARE IF: °· Your toes are numb or blue. °·   You have severe pain that is increasing. MAKE SURE YOU:   Understand these instructions.  Will watch your condition.  Will get help right away if you are not doing well or get worse. Document Released: 11/28/2005 Document Revised: 08/22/2012 Document Reviewed: 12/10/2011 [facility] Patient Information 2015 [place], [place]. This information is not intended to replace advice given to you by your health care provider. Make sure you discuss any questions you have with your health care provider. Cryotherapy Cryotherapy means treatment with cold. Ice or gel packs can be used to reduce both pain and swelling. Ice is the most  helpful within the first 24 to 48 hours after an injury or flare-up from overusing a muscle or joint. Sprains, strains, spasms, burning pain, shooting pain, and aches can all be eased with ice. Ice can also be used when recovering from surgery. Ice is effective, has very few side effects, and is safe for most people to use. PRECAUTIONS  Ice is not a safe treatment option for people with:  Raynaud phenomenon. This is a condition affecting small blood vessels in the extremities. Exposure to cold may cause your problems to return.  Cold hypersensitivity. There are many forms of cold hypersensitivity, including:  Cold urticaria. Red, itchy hives appear on the skin when the tissues begin to warm after being iced.  Cold erythema. This is a red, itchy rash caused by exposure to cold.  Cold hemoglobinuria. Red blood cells break down when the tissues begin to warm after being iced. The hemoglobin that carry oxygen are passed into the urine because they cannot combine with blood proteins fast enough.  Numbness or altered sensitivity in the area being iced. If you have any of the following conditions, do not use ice until you have discussed cryotherapy with your caregiver:  Heart conditions, such as arrhythmia, angina, or chronic heart disease.  High blood pressure.  Healing wounds or open skin in the area being iced.  Current infections.  Rheumatoid arthritis.  Poor circulation.  Diabetes. Ice slows the blood flow in the region it is applied. This is beneficial when trying to stop inflamed tissues from spreading irritating chemicals to surrounding tissues. However, if you expose your skin to cold temperatures for too long or without the proper protection, you can damage your skin or nerves. Watch for signs of skin damage due to cold. HOME CARE INSTRUCTIONS Follow these tips to use ice and cold packs safely.  Place a dry or damp towel between the ice and skin. A damp towel will cool the skin  more quickly, so you may need to shorten the time that the ice is used.  For a more rapid response, add gentle compression to the ice.  Ice for no more than 10 to 20 minutes at a time. The bonier the area you are icing, the less time it will take to get the benefits of ice.  Check your skin after 5 minutes to make sure there are no signs of a poor response to cold or skin damage.  Rest 20 minutes or more between uses.  Once your skin is numb, you can end your treatment. You can test numbness by very lightly touching your skin. The touch should be so light that you do not see the skin dimple from the pressure of your fingertip. When using ice, most people will feel these normal sensations in this order: cold, burning, aching, and numbness.  Do not use ice on someone who cannot communicate their responses to pain,  such as small children or people with dementia. HOW TO MAKE AN ICE PACK Ice packs are the most common way to use ice therapy. Other methods include ice massage, ice baths, and cryosprays. Muscle creams that cause a cold, tingly feeling do not offer the same benefits that ice offers and should not be used as a substitute unless recommended by your caregiver. To make an ice pack, do one of the following:  Place crushed ice or a bag of frozen vegetables in a sealable plastic bag. Squeeze out the excess air. Place this bag inside another plastic bag. Slide the bag into a pillowcase or place a damp towel between your skin and the bag.  Mix 3 parts water with 1 part rubbing alcohol. Freeze the mixture in a sealable plastic bag. When you remove the mixture from the freezer, it will be slushy. Squeeze out the excess air. Place this bag inside another plastic bag. Slide the bag into a pillowcase or place a damp towel between your skin and the bag. SEEK MEDICAL CARE IF:  You develop white spots on your skin. This may give the skin a blotchy (mottled) appearance.  Your skin turns blue or  pale.  Your skin becomes waxy or hard.  Your swelling gets worse. MAKE SURE YOU:   Understand these instructions.  Will watch your condition.  Will get help right away if you are not doing well or get worse. Document Released: 07/25/2011 Document Revised: 04/14/2014 Document Reviewed: 07/25/2011 [facility] Patient Information 2015 [place], [place]. This information is not intended to replace advice given to you by your health care provider. Make sure you discuss any questions you have with your health care provider.

## 2014-12-12 NOTE — ED Notes (Signed)
Pt has a ride home.  

## 2014-12-12 NOTE — ED Notes (Signed)
Pt states that she has had rt ankle pain and swelling for several weeks. Denies any trauma or injury to the extremity but states that she works on her feet all day.

## 2014-12-12 NOTE — ED Provider Notes (Signed)
CSN: [id number]     Arrival date & time 12/12/14  1909 History  This chart was scribed for Charlann Lange, PA-C with Mirna Mires, MD by Edison Simon, ED Scribe. This patient was seen in room WTR6/WTR6 and the patient's care was started at 8:24 PM.    Chief Complaint  Patient presents with  . Ankle Pain   The history is provided by the patient. No language interpreter was used.    HPI Comments: Sharon Benson is a 39 y.o. female who presents to the Emergency Department complaining of right foot and ankle pain with onset 3 weeks ago, worse last night. She notes that she recently worked 14 hours shifts 3 days in a row, the last of which was last night. She locates the pain to the bottom of her foot and around her ankle, shooting into her posterior calf. She reports decreased ROM; at first she was not able to move her ankle laterally but since last night has not been able to move it at all. She states she never wears heels. He states she wears support shoes with inserts.   Past Medical History  Diagnosis Date  . Hypothyroidism   . Seizures     last seizure 08/11/13  . PONV (postoperative nausea and vomiting)   . SVD (spontaneous vaginal delivery)     x 1  . Headache(784.0)   . Endometriosis   . Fibroid    Past Surgical History  Procedure Laterality Date  . Breast reduction surgery    . Ganglion cyst excision      right hand  . Dilation and evacuation N/A 11/25/2013    Procedure: DILATATION AND EVACUATION;  Surgeon: Woodroe Mode, MD;  Location: [facility];  Service: Gynecology;  Laterality: N/A;  . Dilation and curettage of uterus    . Dilation and evacuation N/A 11/30/2013    Procedure: DILATATION AND EVACUATION;  Surgeon: Jonnie Kind, MD;  Location: [facility];  Service: Gynecology;  Laterality: N/A;  . Dilation and evacuation N/A 05/02/2014    Procedure: DILATATION AND EVACUATION;  Surgeon: Ena Dawley, MD;  Location: [facility];  Service: Gynecology;  Laterality: N/A;   Family  History  Problem Relation Age of Onset  . Lung cancer Maternal Grandmother   . Colon cancer Maternal Grandmother   . Cancer Other   . Diabetes Other   . High blood pressure Other   . Diabetes Mother   . Hypertension Father   . Hypertension Paternal Grandmother   . Diabetes Paternal Grandmother   . Heart disease Paternal Grandmother    History  Substance Use Topics  . Smoking status: Former Smoker -- 2 years    Types: Cigarettes    Quit date: 04/28/1997  . Smokeless tobacco: Never Used     Comment: smoked in HS  . Alcohol Use: No   OB History    Gravida Para Term Preterm AB TAB SAB Ectopic Multiple Living   4 1 1  2  2   1      Review of Systems  Respiratory: Negative for shortness of breath.   Cardiovascular: Negative for chest pain.  Musculoskeletal:       Right foot and ankle pain  Neurological: Negative for numbness.      Allergies  Other; Cocoa butter; and Codeine  Home Medications   Prior to Admission medications   Medication Sig Start Date End Date Taking? Authorizing Provider  acetaminophen (TYLENOL) 325 MG tablet Take 325 mg by mouth  every 6 (six) hours as needed for moderate pain (pain).     Historical Provider, MD  Cholecalciferol (VITAMIN D-3) 5000 UNITS TABS Take 10,000 Units by mouth daily.    Historical Provider, MD  folic acid (FOLVITE) 1 MG tablet Take 4 mg by mouth daily.    Historical Provider, MD  HYDROcodone-acetaminophen (NORCO/VICODIN) 5-325 MG per tablet Take 1-2 tablets by mouth every 4 (four) hours as needed. 06/18/14   Daisy Lites A Evadene Wardrip, PA-C  ibuprofen (ADVIL,MOTRIN) 800 MG tablet Take 1 tablet (800 mg total) by mouth 3 (three) times daily. 06/18/14   Marce Schartz A Wilder Kurowski, PA-C  ketorolac (TORADOL) 10 MG tablet Take 1 tablet (10 mg total) by mouth every 6 (six) hours as needed for moderate pain. 05/25/14   Gavin Pound, CNM  Prenatal Vit-Fe Fumarate-FA (PRENATAL MULTIVITAMIN) TABS tablet Take 1 tablet by mouth at bedtime.    Historical Provider, MD   sulfamethoxazole-trimethoprim (SEPTRA DS) 800-160 MG per tablet Take 1 tablet by mouth every 12 (twelve) hours. 06/29/14   Kaitlyn Szekalski, PA-C  Thyroid (NATURE-THROID) 48.75 MG TABS Take 1 tablet by mouth daily.    Historical Provider, MD  traMADol (ULTRAM) 50 MG tablet Take 1 tablet (50 mg total) by mouth every 6 (six) hours as needed. 06/29/14   Kaitlyn Szekalski, PA-C   BP 130/77 mmHg  Pulse 85  Temp(Src) 98 F (36.7 C) (Oral)  Resp 18  Ht 5\' 4"  (1.626 m)  Wt 287 lb (130.182 kg)  BMI 49.24 kg/m2  SpO2 99%  LMP 12/12/2014 (Exact Date) Physical Exam  Constitutional: She is oriented to person, place, and time. She appears well-developed and well-nourished.  HENT:  Head: Normocephalic and atraumatic.  Eyes: Conjunctivae are normal.  Neck: Normal range of motion. Neck supple.  Cardiovascular: Intact distal pulses.   Pulmonary/Chest: Effort normal.  Musculoskeletal: Normal range of motion.  Right ankle minimally swollen laterally without discoloration or bony deformity. Achilles intact. Joint stable. No calf tenderness.   Neurological: She is alert and oriented to person, place, and time.  Skin: Skin is warm and dry.  Psychiatric: She has a normal mood and affect.  Nursing note and vitals reviewed.   ED Course  Procedures (including critical care time)  DIAGNOSTIC STUDIES: Oxygen Saturation is 99% on room air, normal by my interpretation.    COORDINATION OF CARE: 8:29 PM Discussed treatment plan with patient at beside, including boot, Ibuprofen, ice, elevation, and rest. She is reluctant to take time off work. The patient agrees with the plan and has no further questions at this time.  Generealized right ankle and proximal forefoot swelling without discoloration, joint stable, achilles tendon intact, calf nontender, distal pulse intact  Labs Review Labs Reviewed - No data to display  Imaging Review Dg Ankle Complete Right  12/12/2014   CLINICAL DATA:  Right ankle pain  and swelling for 3 weeks without injury.  EXAM: RIGHT ANKLE - COMPLETE 3+ VIEW  COMPARISON:  None.  FINDINGS: There is no evidence of fracture, dislocation, or joint effusion. There is no evidence of arthropathy or other focal bone abnormality. Soft tissues are unremarkable.  IMPRESSION: Normal right ankle.   Electronically Signed   By: Sabino Dick M.D.   On: 12/12/2014 20:13     EKG Interpretation None      MDM   Final diagnoses:  None    1. Right ankle sprain  Ortho referral made and recommended if ankle is no less painful in another 2-4 days. Pain management provided.  I personally performed the services described in this documentation, which was scribed in my presence. The recorded information has been reviewed and is accurate.     Dewaine Oats, PA-C 12/17/14 0041  Mirna Mires, MD 12/17/14 [phone]

## 2014-12-23 ENCOUNTER — Encounter (HOSPITAL_COMMUNITY): Payer: Self-pay

## 2014-12-23 ENCOUNTER — Encounter (HOSPITAL_COMMUNITY)
Admission: RE | Admit: 2014-12-23 | Discharge: 2014-12-23 | Disposition: A | Discharge status: Home / Self Care | Payer: Self-pay | Source: Ambulatory Visit | Attending: Otolaryngology | Admitting: Otolaryngology

## 2014-12-23 DIAGNOSIS — M1712 Unilateral primary osteoarthritis, left knee: Secondary | ICD-10-CM | POA: Insufficient documentation

## 2014-12-23 DIAGNOSIS — Z01812 Encounter for preprocedural laboratory examination: Secondary | ICD-10-CM | POA: Insufficient documentation

## 2014-12-23 HISTORY — DX: Anxiety disorder, unspecified: F41.9

## 2014-12-23 HISTORY — DX: Palpitations: R00.2

## 2014-12-23 HISTORY — DX: Gastro-esophageal reflux disease without esophagitis: K21.9

## 2014-12-23 HISTORY — DX: Unspecified osteoarthritis, unspecified site: M19.90

## 2014-12-23 LAB — BASIC METABOLIC PANEL
Anion gap: 10 (ref 5–15)
BUN: 10 mg/dL (ref 6–23)
CALCIUM: 9.4 mg/dL (ref 8.4–10.5)
CO2: 25 mmol/L (ref 19–32)
CREATININE: 0.72 mg/dL (ref 0.50–1.10)
Chloride: 104 mEq/L (ref 96–112)
GFR calc non Af Amer: >90 mL/min (ref >90)
Glucose, Bld: 81 mg/dL (ref 70–99)
Potassium: 3.7 mmol/L (ref 3.5–5.1)
Sodium: 139 mmol/L (ref 135–145)

## 2014-12-23 LAB — CBC
HEMATOCRIT: 38.1 % (ref 36.0–46.0)
Hemoglobin: 12.6 g/dL (ref 12.0–15.0)
MCH: 28.1 pg (ref 26.0–34.0)
MCHC: 33.1 g/dL (ref 30.0–36.0)
MCV: 84.9 fL (ref 78.0–100.0)
PLATELETS: 243 10*3/uL (ref 150–400)
RBC: 4.49 MIL/uL (ref 3.87–5.11)
RDW: 13.7 % (ref 11.5–15.5)
WBC: 6.6 10*3/uL (ref 4.0–10.5)

## 2014-12-23 LAB — HCG, SERUM, QUALITATIVE: PREG SERUM: NEGATIVE

## 2014-12-23 NOTE — Pre-Procedure Instructions (Signed)
Sharon Benson  12/23/2014   Your procedure is scheduled on: Friday. January 22.  Report to [facility] Admitting at 7:00 AM.  Call this number if you have problems the morning of surgery: [phone]   Remember:   Do not eat food or drink liquids after midnight Thursday, January 21.   Take these medicines the morning of surgery with A SIP OF WATER: thyroid (ARMOUR).                      Stop taking Vitamins and Ibuprofen Friday 12/26/13.   Do not wear jewelry, make-up or nail polish.  Do not wear lotions, powders, or perfumes.  Do not shave 48 hours prior to surgery.   Do not bring valuables to the hospital.               [facility] is not responsible  for any belongings or valuables.               Contacts, dentures or bridgework may not be worn into surgery.  Leave suitcase in the car. After surgery it may be brought to your room.  For patients admitted to the hospital, discharge time is determined by your   treatment team.               Patients discharged the day of surgery will not be allowed to drive home.  Name and phone number of your driver: -   Special Instructions: Review  [facility] - Preparing For Surgery.   Please read over the following fact sheets that you were given: Pain Booklet, Coughing and Deep Breathing and Surgical Site Infection Prevention

## 2014-12-23 NOTE — Progress Notes (Addendum)
Mrs Citron reports have palpations x 2.  Last time was  02/26/14.  "The time before that was 16 years ago when I was pregnant, and I was pregant last March also, the pregnancy test are  Incorrect, I had a miscarrage in May 2015.  Patient does not have a cardiologist, has not had a stress test or Echo.  Mrs Jantz reports a history of seizures, "I  have had a seizure after every surgery except the last two."  The anesthesiologist  asked if I had vertigo and I said yes and he gave me some medication and I did not have a problem.  Patient reported that she last had a seizure non surgical related 12/14/14 while at church. "Did not come to the hospital because they never do anything for them."  Patient said that she "has never been offcially diagnosed with seizures , it never shows up on  CT."  Patient does not see a neurologist and said she did have an EEG many years ago, not sure where.  I shared information with Shelby Dubin and I faxed a request to Dr Bonnita Levan in [place], [place] and requested labs and last office visit.

## 2014-12-24 NOTE — Progress Notes (Addendum)
Anesthesia Chart Review:  Patient is a 39 year old female scheduled for bilateral thyroidectomy on 01/02/15 by Dr. Janace Hoard.  History includes former smoker, post-operative NV, hypothyroidism, anxiety, palpitations, endometriosis, fibroids, GERD, arthritis, breast reduction, headaches, 3 D&C procedures since 11/2013.  She reports "seizure" history, but says she has never been formally diagnosed and is not followed by a neurologist and is not on anti-seizure medications. PCP is Dr. Mylinda Latina in [place] ([place]   Meds include Folvite, Prenatal vitamin, Armour, progesterone.   EKG 02/26/14: SR, low voltage in precordial leads. RSR prime in V1 or V2, right VCD or RVH.   Negative CXR 02/26/14.   Thyroid U/S 10/27/14: IMPRESSION: Predominant nodule in each thyroid lobe demonstrates some increase in size when compared to report of prior exam of 03/07/2011.    Preoperative labs noted.  Serum pregnancy negative.   I called and spoke with patient to clarify seizure history.  She reported interrmittent seizures throughout "all her life".  She's been told that they were stressed inducted.  They can occurs years apart, although last reported episode was at church on 12/14/14--prior to that ~ 11/2013.  Friends tell her the episodes last 1-2 minutes and involves upper body tremors and her eyes roll back. She had episodes after anesthesia in 1997 following her breat reduction surgery and after her 11/25/13 D&C (although she reported only feeling dizzy after that procedure). She says her anesthesiologist gave her something for vertigo/motion sickness, and she did well with her last two D&S surgeries.  She has never been on anti-seizure medication because she doesn't think it will help since it didn't help her mother.  She will feel sweaty and hot just before an episode and can sit or lie down to prevent herself from falling.  She has only been on driving restrictions while she was  pregnant. She does not have SOB, palpitations, or chest pain with these episodes.  She denied prior cardiac testing other than an EKG.  She had a murmur at infancy, but has never been told she had a murmur as an adult.  She things she last saw a neurologist with an EEG ~ 2014 at [facility], records pending.   I'll follow-up once additional neurology records received.  George Hugh [facility] Short Stay Center/Anesthesiology Phone [phone] 12/24/2014 3:44 PM  Addendum: PCP records received. Dr. Tye Savoy is aware that she need thyroidectomy.  He is aware of her seizure disorder--"Doesn't get them very often, only when she is stressed. Doesn't take medications for it." TSH on 03/19/14 1.97.  [facility] records received.  Last visit was with neurologist Dr. Andrey Spearman on 09/03/13.  DDx then included provoked syncope versus temporal lobe seizure.  He did not start anti-seizure medication at that visit, but recommended ambulatory cardiology referral, MRI, and EEG.  EEG was done on 09/09/13 and showed: Equivocal EEG in the awake state demonstrating intermittent frontal rhythmic delta activity noted (FIRDA). This is non-specific but can be seen in toxic metabolic disturbances or structural abnormalities, but does not suggest an epileptic predisposition. No focal, lateralizing, epileptiform activity of seizures are seen.    Above discussed with anesthesiologist Dr. Marcie Bal yesterday.  Patient with possible seizure history since childhood but has never been placed on medication.  EEG was equivocal.  No murmur history as an adult.  Recent surgeries at [facility]. He did not necessarily feel she would require complete neuro-cardiac work-up prior to planned surgery, but did recommend that she should complete  her work-up in the future. I called and notified patient of this. She verbalized understanding. It may also do well for her assigned anesthesiologist to discuss this with her at  the time of her pre-anesthesia evaluation.  Of note, patient reiterated her concern about anesthetics used. She became dizzy (almost felt like she could have one of her seizure spells) following her 11/25/13 surgery.  She notified her subsequent anesthesiologists from her 11/30/13 and 05/02/14 and was given something for vertigo (does NOT think that it was a scopalamine patch) and did well for those surgeries.  I notified her that her these anesthesia records are easily accessible by her anesthesiologist on the day of surgery. They can review to formulate the best anesthesia plan for her.    George Hugh [facility] Short Stay Center/Anesthesiology Phone [phone] 12/26/2014 1:13 PM

## 2014-12-26 NOTE — Anesthesia Preprocedure Evaluation (Addendum)
Anesthesia Evaluation  Patient identified by MRN, date of birth, ID band Patient awake    Reviewed: Allergy & Precautions, NPO status , Patient's Chart, lab work & pertinent test results  Airway Mallampati: II  TM Distance: <3 FB Neck ROM: Full    Dental no notable dental hx.    Pulmonary neg pulmonary ROS, former smoker,  breath sounds clear to auscultation  + decreased breath sounds      Cardiovascular negative cardio ROS  Rhythm:Regular Rate:Normal     Neuro/Psych negative neurological ROS  negative psych ROS   GI/Hepatic negative GI ROS, Neg liver ROS,   Endo/Other  Hypothyroidism Morbid obesity  Renal/GU negative Renal ROS  negative genitourinary   Musculoskeletal negative musculoskeletal ROS (+)   Abdominal   Peds negative pediatric ROS (+)  Hematology negative hematology ROS (+)   Anesthesia Other Findings   Reproductive/Obstetrics negative OB ROS                            Anesthesia Physical Anesthesia Plan  ASA: III  Anesthesia Plan: General   Post-op Pain Management:    Induction: Intravenous  Airway Management Planned: Oral ETT  Additional Equipment:   Intra-op Plan:   Post-operative Plan: Extubation in OR  Informed Consent: I have reviewed the patients History and Physical, chart, labs and discussed the procedure including the risks, benefits and alternatives for the proposed anesthesia with the patient or authorized representative who has indicated his/her understanding and acceptance.   Dental advisory given  Plan Discussed with: CRNA and Surgeon  Anesthesia Plan Comments: (See my anesthesia note.  Was given medication for vertigo prior to her last two surgeries and did much better than her 11/25/13 surgery.  Records are in Epic.  Myra Gianotti, PA-C)       Anesthesia Quick Evaluation

## 2015-01-02 ENCOUNTER — Encounter (HOSPITAL_COMMUNITY): Payer: Self-pay | Admitting: General Practice

## 2015-01-02 ENCOUNTER — Observation Stay (HOSPITAL_COMMUNITY)
Admission: RE | Admit: 2015-01-02 | Discharge: 2015-01-03 | Disposition: A | Discharge status: Home / Self Care | Payer: No Typology Code available for payment source | Source: Ambulatory Visit | Attending: Otolaryngology | Admitting: Otolaryngology

## 2015-01-02 ENCOUNTER — Ambulatory Visit (HOSPITAL_COMMUNITY): Payer: No Typology Code available for payment source | Admitting: Anesthesiology

## 2015-01-02 ENCOUNTER — Encounter (HOSPITAL_COMMUNITY)
Admission: RE | Disposition: A | Discharge status: Home / Self Care | Payer: Self-pay | Source: Ambulatory Visit | Attending: Otolaryngology

## 2015-01-02 ENCOUNTER — Ambulatory Visit (HOSPITAL_COMMUNITY): Payer: No Typology Code available for payment source | Admitting: Vascular Surgery

## 2015-01-02 ENCOUNTER — Ambulatory Visit (HOSPITAL_COMMUNITY): Payer: No Typology Code available for payment source

## 2015-01-02 DIAGNOSIS — F1210 Cannabis abuse, uncomplicated: Secondary | ICD-10-CM | POA: Diagnosis not present

## 2015-01-02 DIAGNOSIS — M1990 Unspecified osteoarthritis, unspecified site: Secondary | ICD-10-CM | POA: Diagnosis not present

## 2015-01-02 DIAGNOSIS — E039 Hypothyroidism, unspecified: Secondary | ICD-10-CM | POA: Diagnosis not present

## 2015-01-02 DIAGNOSIS — E041 Nontoxic single thyroid nodule: Secondary | ICD-10-CM | POA: Diagnosis present

## 2015-01-02 DIAGNOSIS — F410 Panic disorder [episodic paroxysmal anxiety] without agoraphobia: Secondary | ICD-10-CM | POA: Diagnosis not present

## 2015-01-02 DIAGNOSIS — Z885 Allergy status to narcotic agent status: Secondary | ICD-10-CM | POA: Diagnosis not present

## 2015-01-02 DIAGNOSIS — Z87891 Personal history of nicotine dependence: Secondary | ICD-10-CM | POA: Insufficient documentation

## 2015-01-02 DIAGNOSIS — E079 Disorder of thyroid, unspecified: Secondary | ICD-10-CM | POA: Diagnosis present

## 2015-01-02 DIAGNOSIS — C73 Malignant neoplasm of thyroid gland: Principal | ICD-10-CM | POA: Insufficient documentation

## 2015-01-02 DIAGNOSIS — K219 Gastro-esophageal reflux disease without esophagitis: Secondary | ICD-10-CM | POA: Diagnosis not present

## 2015-01-02 DIAGNOSIS — Z888 Allergy status to other drugs, medicaments and biological substances status: Secondary | ICD-10-CM | POA: Insufficient documentation

## 2015-01-02 DIAGNOSIS — Z01818 Encounter for other preprocedural examination: Secondary | ICD-10-CM

## 2015-01-02 HISTORY — PX: THYROIDECTOMY: SHX17

## 2015-01-02 LAB — CBC
HCT: 38.5 % (ref 36.0–46.0)
HEMOGLOBIN: 12.6 g/dL (ref 12.0–15.0)
MCH: 27.9 pg (ref 26.0–34.0)
MCHC: 32.7 g/dL (ref 30.0–36.0)
MCV: 85.4 fL (ref 78.0–100.0)
Platelets: 211 10*3/uL (ref 150–400)
RBC: 4.51 MIL/uL (ref 3.87–5.11)
RDW: 13.7 % (ref 11.5–15.5)
WBC: 8.6 10*3/uL (ref 4.0–10.5)

## 2015-01-02 LAB — CREATININE, SERUM
CREATININE: 0.88 mg/dL (ref 0.50–1.10)
GFR calc non Af Amer: 84 mL/min — ABNORMAL LOW (ref >90)

## 2015-01-02 LAB — GLUCOSE, CAPILLARY: Glucose-Capillary: 147 mg/dL — ABNORMAL HIGH (ref 70–99)

## 2015-01-02 SURGERY — THYROIDECTOMY
Anesthesia: General | Site: Neck | Laterality: Bilateral

## 2015-01-02 MED ORDER — BACITRACIN ZINC 500 UNIT/GM EX OINT: 1.0000 "application " | TOPICAL_OINTMENT | Freq: Three times a day (TID) | CUTANEOUS | Status: DC

## 2015-01-02 MED ORDER — CALCIUM CARBONATE-VITAMIN D 500-200 MG-UNIT PO TABS
2.0000 | ORAL_TABLET | Freq: Two times a day (BID) | ORAL | Status: DC
  Administered 2015-01-02 – 2015-01-03 (×3): 2 via ORAL
  Filled 2015-01-02 (×4): qty 2

## 2015-01-02 MED ORDER — PROPOFOL 10 MG/ML IV BOLUS
INTRAVENOUS | Status: DC | PRN
  Administered 2015-01-02: 200 mg via INTRAVENOUS
  Administered 2015-01-02: 100 mg via INTRAVENOUS

## 2015-01-02 MED ORDER — IBUPROFEN 600 MG PO TABS: 600.0000 mg | ORAL_TABLET | Freq: Four times a day (QID) | ORAL | Status: DC | PRN

## 2015-01-02 MED ORDER — DEXAMETHASONE SODIUM PHOSPHATE 4 MG/ML IJ SOLN
INTRAMUSCULAR | Status: DC | PRN
  Administered 2015-01-02: 8 mg via INTRAVENOUS

## 2015-01-02 MED ORDER — LIDOCAINE HCL (CARDIAC) 20 MG/ML IV SOLN
INTRAVENOUS | Status: AC
  Filled 2015-01-02: qty 5

## 2015-01-02 MED ORDER — MIDAZOLAM HCL 2 MG/2ML IJ SOLN
INTRAMUSCULAR | Status: AC
  Filled 2015-01-02: qty 2

## 2015-01-02 MED ORDER — ONDANSETRON HCL 4 MG/2ML IJ SOLN
INTRAMUSCULAR | Status: DC | PRN
  Administered 2015-01-02: 4 mg via INTRAVENOUS

## 2015-01-02 MED ORDER — LIDOCAINE HCL (CARDIAC) 20 MG/ML IV SOLN
INTRAVENOUS | Status: DC | PRN
  Administered 2015-01-02: 80 mg via INTRAVENOUS

## 2015-01-02 MED ORDER — DEXTROSE-NACL 5-0.45 % IV SOLN
INTRAVENOUS | Status: DC
  Administered 2015-01-02 – 2015-01-03 (×3): via INTRAVENOUS

## 2015-01-02 MED ORDER — 0.9 % SODIUM CHLORIDE (POUR BTL) OPTIME
TOPICAL | Status: DC | PRN
  Administered 2015-01-02: 1000 mL

## 2015-01-02 MED ORDER — FENTANYL CITRATE 0.05 MG/ML IJ SOLN
INTRAMUSCULAR | Status: DC | PRN
  Administered 2015-01-02 (×4): 50 ug via INTRAVENOUS
  Administered 2015-01-02 (×2): 100 ug via INTRAVENOUS

## 2015-01-02 MED ORDER — PROMETHAZINE HCL 25 MG/ML IJ SOLN: 6.2500 mg | INTRAMUSCULAR | Status: DC | PRN

## 2015-01-02 MED ORDER — FENTANYL CITRATE 0.05 MG/ML IJ SOLN
INTRAMUSCULAR | Status: AC
  Filled 2015-01-02: qty 5

## 2015-01-02 MED ORDER — ONDANSETRON HCL 4 MG PO TABS: 4.0000 mg | ORAL_TABLET | ORAL | Status: DC | PRN

## 2015-01-02 MED ORDER — LACTATED RINGERS IV SOLN
INTRAVENOUS | Status: DC
  Administered 2015-01-02: 11:00:00 via INTRAVENOUS
  Administered 2015-01-02: 50 mL/h via INTRAVENOUS

## 2015-01-02 MED ORDER — HEPARIN SODIUM (PORCINE) 5000 UNIT/ML IJ SOLN
5000.0000 [IU] | Freq: Three times a day (TID) | INTRAMUSCULAR | Status: DC
  Administered 2015-01-02 – 2015-01-03 (×2): 5000 [IU] via SUBCUTANEOUS
  Filled 2015-01-02 (×4): qty 1

## 2015-01-02 MED ORDER — HYDROMORPHONE HCL 1 MG/ML IJ SOLN: 0.2500 mg | INTRAMUSCULAR | Status: DC | PRN

## 2015-01-02 MED ORDER — MIDAZOLAM HCL 5 MG/5ML IJ SOLN
INTRAMUSCULAR | Status: DC | PRN
  Administered 2015-01-02: 2 mg via INTRAVENOUS

## 2015-01-02 MED ORDER — ONDANSETRON HCL 4 MG/2ML IJ SOLN
INTRAMUSCULAR | Status: AC
  Filled 2015-01-02: qty 2

## 2015-01-02 MED ORDER — DEXAMETHASONE SODIUM PHOSPHATE 4 MG/ML IJ SOLN
INTRAMUSCULAR | Status: AC
  Filled 2015-01-02: qty 2

## 2015-01-02 MED ORDER — SUCCINYLCHOLINE CHLORIDE 20 MG/ML IJ SOLN
INTRAMUSCULAR | Status: DC | PRN
  Administered 2015-01-02: 100 mg via INTRAVENOUS

## 2015-01-02 MED ORDER — ONDANSETRON HCL 4 MG/2ML IJ SOLN
4.0000 mg | INTRAMUSCULAR | Status: DC | PRN
  Administered 2015-01-02: 4 mg via INTRAVENOUS
  Filled 2015-01-02: qty 2

## 2015-01-02 MED ORDER — PROPOFOL 10 MG/ML IV BOLUS
INTRAVENOUS | Status: AC
  Filled 2015-01-02: qty 20

## 2015-01-02 SURGICAL SUPPLY — 52 items
ADH SKN CLS APL DERMABOND .7 (GAUZE/BANDAGES/DRESSINGS)
APL SKNCLS STERI-STRIP NONHPOA (GAUZE/BANDAGES/DRESSINGS) ×1
APPLIER CLIP 9.375 SM OPEN (CLIP) ×12
APR CLP SM 9.3 20 MLT OPN (CLIP) ×4
ATTRACTOMAT 16X20 MAGNETIC DRP (DRAPES) IMPLANT
BENZOIN TINCTURE PRP APPL 2/3 (GAUZE/BANDAGES/DRESSINGS) ×2 IMPLANT
BLADE SURG ROTATE 9660 (MISCELLANEOUS) IMPLANT
CANISTER SUCTION 2500CC (MISCELLANEOUS) ×3 IMPLANT
CLEANER TIP ELECTROSURG 2X2 (MISCELLANEOUS) ×3 IMPLANT
CLIP APPLIE 9.375 SM OPEN (CLIP) ×1 IMPLANT
CONT SPEC 4OZ CLIKSEAL STRL BL (MISCELLANEOUS) ×3 IMPLANT
CORDS BIPOLAR (ELECTRODE) ×3 IMPLANT
COVER SURGICAL LIGHT HANDLE (MISCELLANEOUS) ×3 IMPLANT
CRADLE DONUT ADULT HEAD (MISCELLANEOUS) ×2 IMPLANT
DERMABOND ADVANCED (GAUZE/BANDAGES/DRESSINGS)
DERMABOND ADVANCED .7 DNX12 (GAUZE/BANDAGES/DRESSINGS) IMPLANT
DRAIN JACKSON RD 7FR 3/32 (WOUND CARE) IMPLANT
DRAPE PROXIMA HALF (DRAPES) ×3 IMPLANT
ELECT COATED BLADE 2.86 ST (ELECTRODE) ×3 IMPLANT
ELECT REM PT RETURN 9FT ADLT (ELECTROSURGICAL) ×3
ELECTRODE REM PT RTRN 9FT ADLT (ELECTROSURGICAL) ×1 IMPLANT
EVACUATOR SILICONE 100CC (DRAIN) ×3 IMPLANT
GAUZE SPONGE 4X4 16PLY XRAY LF (GAUZE/BANDAGES/DRESSINGS) ×6 IMPLANT
GLOVE ECLIPSE 7.5 STRL STRAW (GLOVE) ×3 IMPLANT
GOWN STRL REUS W/ TWL LRG LVL3 (GOWN DISPOSABLE) ×2 IMPLANT
GOWN STRL REUS W/TWL LRG LVL3 (GOWN DISPOSABLE) ×9
KIT BASIN OR (CUSTOM PROCEDURE TRAY) ×3 IMPLANT
KIT ROOM TURNOVER OR (KITS) ×3 IMPLANT
LIQUID BAND (GAUZE/BANDAGES/DRESSINGS) ×2 IMPLANT
LOCATOR NERVE 3 VOLT (DISPOSABLE) IMPLANT
NS IRRIG 1000ML POUR BTL (IV SOLUTION) ×3 IMPLANT
PAD ARMBOARD 7.5X6 YLW CONV (MISCELLANEOUS) ×6 IMPLANT
PENCIL FOOT CONTROL (ELECTRODE) ×3 IMPLANT
PROBE NERVBE PRASS .33 (MISCELLANEOUS) ×3 IMPLANT
SPONGE INTESTINAL PEANUT (DISPOSABLE) IMPLANT
STAPLER VISISTAT 35W (STAPLE) ×3 IMPLANT
SUT CHROMIC 4 0 PS 2 18 (SUTURE) ×6 IMPLANT
SUT ETHILON 3 0 PS 1 (SUTURE) ×3 IMPLANT
SUT SILK 2 0 (SUTURE) ×3
SUT SILK 2-0 18XBRD TIE 12 (SUTURE) ×1 IMPLANT
SUT SILK 3 0 (SUTURE) ×6
SUT SILK 3-0 18XBRD TIE 12 (SUTURE) IMPLANT
SUT SILK 4 0 (SUTURE) ×3
SUT SILK 4-0 18XBRD TIE 12 (SUTURE) ×1 IMPLANT
TOWEL OR 17X24 6PK STRL BLUE (TOWEL DISPOSABLE) ×3 IMPLANT
TOWEL OR 17X26 10 PK STRL BLUE (TOWEL DISPOSABLE) ×3 IMPLANT
TRAY ENT MC OR (CUSTOM PROCEDURE TRAY) ×3 IMPLANT
TRAY FOLEY CATH 14FR (SET/KITS/TRAYS/PACK) IMPLANT
TUBE ENDOTRAC EMG 7X10.2 (MISCELLANEOUS) ×2 IMPLANT
TUBE ENDOTRAC EMG 8X11.3 (MISCELLANEOUS) IMPLANT
TUBE ENDOTRACH  EMG 6MMTUBE EN (MISCELLANEOUS)
TUBE ENDOTRACH EMG 6MMTUBE EN (MISCELLANEOUS) IMPLANT

## 2015-01-02 NOTE — Anesthesia Procedure Notes (Signed)
Procedure Name: Intubation Date/Time: 01/02/2015 9:02 AM Performed by: Julian Reil Pre-anesthesia Checklist: Patient identified, Emergency Drugs available, Suction available and Patient being monitored Patient Re-evaluated:Patient Re-evaluated prior to inductionOxygen Delivery Method: Circle system utilized Preoxygenation: Pre-oxygenation with 100% oxygen Intubation Type: IV induction Ventilation: Mask ventilation without difficulty Grade View: Grade III Tube type: Oral Tube size: 7.0 mm Number of attempts: 2 Airway Equipment and Method: Stylet Placement Confirmation: ETT inserted through vocal cords under direct vision,  positive ETCO2 and breath sounds checked- equal and bilateral Secured at: 22 cm Tube secured with: Tape Dental Injury: Bloody posterior oropharynx  Difficulty Due To: Difficulty was unanticipated, Difficult Airway- due to anterior larynx and Difficult Airway-  due to edematous airway Future Recommendations: Recommend- induction with short-acting agent, and alternative techniques readily available Comments: Easy mask airway. DL with MAC 3. View of epiglottis only. Resumed mask ventilation. DL with Glidescope. Grade I view with glidescope but still had some difficulty inserting ETT due to anterior airway and need to increase the angle on the stylet. Teeth/lips as preop. Some blood noted in posterior oropharynx. +EtCO2, +BBS. Recommend Glidescope in future.

## 2015-01-02 NOTE — Progress Notes (Signed)
01/02/2015 5:26 PM  Sharon Benson [id number]  Post-Op Check   Temp:  [97.4 F (36.3 C)-98.6 F (37 C)] 98.4 F (36.9 C) (01/22 1312) Pulse Rate:  [79-91] 79 (01/22 1312) Resp:  [7-30] 20 (01/22 1312) BP: (116-150)/(60-74) 130/60 mmHg (01/22 1312) SpO2:  [93 %-100 %] 99 % (01/22 1312) Weight:  [125.646 kg (277 lb)] 125.646 kg (277 lb) (01/22 0702),     Intake/Output Summary (Last 24 hours) at 01/02/15 1726 Last data filed at 01/02/15 1313  Gross per 24 hour  Intake   1060 ml  Output      0 ml  Net   1060 ml   No JP drainage recorded yet  Results for orders placed or performed during the hospital encounter of 01/02/15 (from the past 24 hour(s))  Glucose, capillary     Status: Abnormal   Collection Time: 01/02/15  1:29 PM  Result Value Ref Range   Glucose-Capillary 147 (H) 70 - 99 mg/dL  CBC     Status: None   Collection Time: 01/02/15  1:30 PM  Result Value Ref Range   WBC 8.6 4.0 - 10.5 K/uL   RBC 4.51 3.87 - 5.11 MIL/uL   Hemoglobin 12.6 12.0 - 15.0 g/dL   HCT 38.5 36.0 - 46.0 %   MCV 85.4 78.0 - 100.0 fL   MCH 27.9 26.0 - 34.0 pg   MCHC 32.7 30.0 - 36.0 g/dL   RDW 13.7 11.5 - 15.5 %   Platelets 211 150 - 400 K/uL  Creatinine, serum     Status: Abnormal   Collection Time: 01/02/15  1:30 PM  Result Value Ref Range   Creatinine, Ser 0.88 0.50 - 1.10 mg/dL   GFR calc non Af Amer 84 (L) >90 mL/min   GFR calc Af Amer >90 >90 mL/min    SUBJECTIVE:  Some dizziness.  No N,V.  No SOB.  No chest pain.  Spont void.  Pain controlled.  Drinking some liquids  OBJECTIVE:  Voice clear.  Breathing well.  Neck flat.  Drain functioning  IMPRESSION:  Satisfactory check  PLAN:  Ca++ check in AM.  Drain out and discharge home in AM if doing well.  Jodi Marble

## 2015-01-02 NOTE — H&P (Signed)
Sharon Benson is an 39 y.o. female.   Chief Complaint: thyroid mass HPI: Hx of thyroid nodules for years and now wants them removed. She has symptoms in throat and is concerned they are from the thyroid  Past Medical History  Diagnosis Date  . Hypothyroidism   . SVD (spontaneous vaginal delivery)     x 1  . Headache(784.0)   . Endometriosis   . Fibroid   . PONV (postoperative nausea and vomiting)     last 2- no vomiting-  Seizures- not with last 2.  . Palpitations   . Seizures     last seizure 08/11/13, last one 12/14/14-  . Anxiety     panic attack - not recent 2001ish  . GERD (gastroesophageal reflux disease)   . Arthritis     Past Surgical History  Procedure Laterality Date  . Breast reduction surgery    . Ganglion cyst excision  1999    right hand  . Dilation and evacuation N/A 11/25/2013    Procedure: DILATATION AND EVACUATION;  Surgeon: Woodroe Mode, MD;  Location: [facility];  Service: Gynecology;  Laterality: N/A;  . Dilation and curettage of uterus    . Dilation and evacuation N/A 11/30/2013    Procedure: DILATATION AND EVACUATION;  Surgeon: Jonnie Kind, MD;  Location: [facility];  Service: Gynecology;  Laterality: N/A;  . Dilation and evacuation N/A 05/02/2014    Procedure: DILATATION AND EVACUATION;  Surgeon: Ena Dawley, MD;  Location: [facility];  Service: Gynecology;  Laterality: N/A;  . Breast surgery  1997    Family History  Problem Relation Age of Onset  . Lung cancer Maternal Grandmother   . Colon cancer Maternal Grandmother   . Cancer Other   . Diabetes Other   . High blood pressure Other   . Diabetes Mother   . Hypertension Father   . Hypertension Paternal Grandmother   . Diabetes Paternal Grandmother   . Heart disease Paternal Grandmother    Social History:  reports that she quit smoking about 17 years ago. Her smoking use included Cigarettes. She quit after 2 years of use. She has never used smokeless tobacco. She reports that she uses illicit  drugs (Marijuana). She reports that she does not drink alcohol.  Allergies:  Allergies  Allergen Reactions  . Other Anaphylaxis and Itching    Cantaloupe  Pt has Vertigo  . Cocoa Butter Hives  . Codeine Other (See Comments)    seizures    Medications Prior to Admission  Medication Sig Dispense Refill  . Cholecalciferol (VITAMIN D-3) 5000 UNITS TABS Take 10,000 Units by mouth daily.    . folic acid (FOLVITE) 1 MG tablet Take 4 mg by mouth daily.    . Prenatal Vit-Fe Fumarate-FA (PRENATAL MULTIVITAMIN) TABS tablet Take 1 tablet by mouth at bedtime.    . progesterone (PROMETRIUM) 100 MG capsule Take 100 mg by mouth daily.    Marland Kitchen thyroid (ARMOUR) 65 MG tablet Take 65 mg by mouth daily.    Marland Kitchen acetaminophen (TYLENOL) 325 MG tablet Take 325 mg by mouth every 6 (six) hours as needed for moderate pain (pain).     Marland Kitchen HYDROcodone-acetaminophen (NORCO/VICODIN) 5-325 MG per tablet Take 1-2 tablets by mouth every 4 (four) hours as needed. (Patient not taking: Reported on 12/22/2014) 12 tablet 0  . ibuprofen (ADVIL,MOTRIN) 800 MG tablet Take 1 tablet (800 mg total) by mouth 3 (three) times daily. (Patient taking differently: Take 800 mg by mouth every 8 (eight)  hours as needed for mild pain or moderate pain. ) 21 tablet 0  . ketorolac (TORADOL) 10 MG tablet Take 1 tablet (10 mg total) by mouth every 6 (six) hours as needed for moderate pain. (Patient not taking: Reported on 12/12/2014) 30 tablet 0  . sulfamethoxazole-trimethoprim (SEPTRA DS) 800-160 MG per tablet Take 1 tablet by mouth every 12 (twelve) hours. (Patient not taking: Reported on 12/12/2014) 14 tablet 0  . traMADol (ULTRAM) 50 MG tablet Take 1 tablet (50 mg total) by mouth every 6 (six) hours as needed. (Patient not taking: Reported on 12/12/2014) 15 tablet 0    No results found for this or any previous visit (from the past 48 hour(s)). Dg Chest 2 View  01/02/2015   CLINICAL DATA:  Preoperative for thyroidectomy  EXAM: CHEST  2 VIEW  COMPARISON:   February 26, 2014  FINDINGS: The lungs are clear. Heart size and pulmonary vascularity are normal. No adenopathy. Trachea is midline. No bone lesions.  IMPRESSION: No abnormality noted.   Electronically Signed   By: Lowella Grip M.D.   On: 01/02/2015 07:45    Review of Systems  Constitutional: Negative.   HENT: Negative.   Eyes: Negative.   Respiratory: Negative.   Cardiovascular: Negative.   Skin: Negative.     Blood pressure 150/74, pulse 87, temperature 98 F (36.7 C), temperature source Oral, resp. rate 18, weight 125.646 kg (277 lb), last menstrual period 03/13/2014, SpO2 99 %, not currently breastfeeding. Physical Exam  Constitutional: She appears well-developed and well-nourished.  HENT:  Head: Normocephalic.  Nose: Nose normal.  Mouth/Throat: Oropharynx is clear and moist.  Eyes: Conjunctivae are normal. Pupils are equal, round, and reactive to light.  Neck: Normal range of motion. Neck supple.  Cardiovascular: Normal rate.   Respiratory: Effort normal.  GI: Soft.  Musculoskeletal: Normal range of motion.     Assessment/Plan Thyroid masses- she has been very worried and wants thyroid masses removed. Discussed procedure. Ready to proceed  Melissa Montane 01/02/2015, 7:57 AM

## 2015-01-02 NOTE — Progress Notes (Signed)
Phonates well, throat "a little sore"

## 2015-01-02 NOTE — Op Note (Signed)
Preop/postop diagnosis: Thyroid masses Procedure: Total thyroidectomy with Nims monitor Anesthesia: Gen. Estimated blood loss: Less than 10 mL Indications: 39 year old who's had a mass in her thyroid on both sides for many years. The right one has increased from subcentimeter to 3 cm and there is a left 2 cm mass. She does not want any further workup but rather wants these removed for no further concerned and her endocrinologist also recommends the same. She is informed a risk and benefits of the procedure and options were discussed all questions are answered and consent was obtained. Procedure: Patient was taken to the operating room placed in the supine position after general endotracheal tube anesthesia with the Nims monitor endotracheal tube she was prepped and draped in the usual sterile manner. An incision was made in the skin crease in the inferior central portion of the neck and dissected down to the platysma muscle. This was lateral course. The superior and inferior flaps were elevated and the self-retaining retractor positioned. The midline of the strap muscles was identified and opened. The right thyroid was addressed first with a careful layer by layer dissection along the fascia of the thyroid extending from medial to lateral. The parathyroid glands were identified and dissected off the gland and preserved. Both the inferior and superior ones were seen. The thyroid was then brought medial and the recurrent nerve was identified in the location of the inferior thyroid artery. The artery was clamped and divided. The nerve was dissected up to its insertion into the larynx and preserved. The gland was then brought off Berry's ligament and across the midline. The left gland was dissected in the same fashion same technique. Both parathyroid glands were identified on the left side as well. Both nerves were then stimulated with the monitor and had good function of the cords by monitor. Wound was irrigated  with saline and a #7 JP drain placed into the wound. Was brought out laterally and secured with 3-0 nylon. The strap muscles were loosely closed with interrupted 4-0 chromic and the subcutaneous closed with interrupted 4-0 chromic. Skin was closed with Dermabond. Patient was then awakened brought to recovery room in stable condition counts correct

## 2015-01-02 NOTE — Anesthesia Postprocedure Evaluation (Signed)
  Anesthesia Post-op Note  Patient: Sharon Benson  Procedure(s) Performed: Procedure(s): TOTAL THYROIDECTOMY (Bilateral)  Patient Location: PACU  Anesthesia Type:General  Level of Consciousness: awake and alert   Airway and Oxygen Therapy: Patient Spontanous Breathing  Post-op Pain: none  Post-op Assessment: Post-op Vital signs reviewed  Post-op Vital Signs: Reviewed  Last Vitals:  Filed Vitals:   01/02/15 1312  BP: 130/60  Pulse: 79  Temp: 36.9 C  Resp: 20    Complications: No apparent anesthesia complications

## 2015-01-02 NOTE — Transfer of Care (Signed)
Immediate Anesthesia Transfer of Care Note  Patient: Sharon Benson  Procedure(s) Performed: Procedure(s): TOTAL THYROIDECTOMY (Bilateral)  Patient Location: PACU  Anesthesia Type:General  Level of Consciousness: sedated and responds to stimulation  Airway & Oxygen Therapy: Patient Spontanous Breathing and Patient connected to face mask oxygen  Post-op Assessment: Report given to PACU RN, Post -op Vital signs reviewed and stable and Patient moving all extremities  Post vital signs: Reviewed and stable  Complications: No apparent anesthesia complications

## 2015-01-03 DIAGNOSIS — C73 Malignant neoplasm of thyroid gland: Secondary | ICD-10-CM | POA: Diagnosis not present

## 2015-01-03 LAB — CALCIUM: Calcium: 10.1 mg/dL (ref 8.4–10.5)

## 2015-01-03 MED ORDER — HYDROCODONE-ACETAMINOPHEN 5-325 MG PO TABS: 1.0000 | ORAL_TABLET | Freq: Four times a day (QID) | ORAL | Status: DC | PRN

## 2015-01-03 NOTE — Discharge Instructions (Signed)
Keep head elevated 3-4 nights OK to shower starting Sunday No need to clean or place ointment on wound.  You can trim the skin glue as the edges start to peel up. Advance diet as comfortable Continue your thyroid medication No strenuous activity x 2 weeks Call for signs of bleeding or infection [phone] Recheck appointment 2 weeks, Dr. Janace Hoard, [phone]

## 2015-01-03 NOTE — Progress Notes (Signed)
UR completed 

## 2015-01-03 NOTE — Discharge Summary (Signed)
  01/03/2015 12:12 PM  [place], [place] [id number]  Post-Op Day 1    Temp:  [97.4 F (36.3 C)-98.4 F (36.9 C)] 98.3 F (36.8 C) (01/23 0509) Pulse Rate:  [79-91] 80 (01/23 0509) Resp:  [18-30] 18 (01/23 0509) BP: (117-130)/(60-76) 120/61 mmHg (01/23 0509) SpO2:  [93 %-100 %] 96 % (01/23 0509) Weight:  [125.646 kg (277 lb)] 125.646 kg (277 lb) (01/22 1925),     Intake/Output Summary (Last 24 hours) at 01/03/15 1212 Last data filed at 01/03/15 0930  Gross per 24 hour  Intake 2872.92 ml  Output    250 ml  Net 2622.92 ml   JP drain 25 ml  Results for orders placed or performed during the hospital encounter of 01/02/15 (from the past 24 hour(s))  Glucose, capillary     Status: Abnormal   Collection Time: 01/02/15  1:29 PM  Result Value Ref Range   Glucose-Capillary 147 (H) 70 - 99 mg/dL  CBC     Status: None   Collection Time: 01/02/15  1:30 PM  Result Value Ref Range   WBC 8.6 4.0 - 10.5 K/uL   RBC 4.51 3.87 - 5.11 MIL/uL   Hemoglobin 12.6 12.0 - 15.0 g/dL   HCT 38.5 36.0 - 46.0 %   MCV 85.4 78.0 - 100.0 fL   MCH 27.9 26.0 - 34.0 pg   MCHC 32.7 30.0 - 36.0 g/dL   RDW 13.7 11.5 - 15.5 %   Platelets 211 150 - 400 K/uL  Creatinine, serum     Status: Abnormal   Collection Time: 01/02/15  1:30 PM  Result Value Ref Range   Creatinine, Ser 0.88 0.50 - 1.10 mg/dL   GFR calc non Af Amer 84 (L) >90 mL/min   GFR calc Af Amer >90 >90 mL/min  Calcium     Status: None   Collection Time: 01/03/15  3:14 AM  Result Value Ref Range   Calcium 10.1 8.4 - 10.5 mg/dL    SUBJECTIVE:  Min pain.  No SOB.  Swallowing somewhat uncomfortable.  No breathing issues or voice change  OBJECTIVE:  Voice clear.  Breathing easy. Neck wound flat.  Drain removed.  IMPRESSION:  Satisfactory check  PLAN:  Discharge to home and care of family  Admit:  60 JAN Discharge; 23 JAN Final Diagnosis:  Bilateral thyroid nodules Proc: total thyroidectomy, 22 JAN Comp:  None Cond;  Ambulatory. Pain  controlled.  Taking po.  Voiding spontaneously. Recheck:  2 weeks Dr. Janace Hoard Rx's:  Hydrocodone Instructions written and given  Hosp Course:  Underwent surgery.  Felt somewhat dizzy afterwards, which cleared rapidly.  Min pain.  Able to swallow liquids. Able to void.  Breathing well and voice working well.  Ca++10.1 on POD 1.   Drain removed and discharged to home and care of family on POD 1.  Sharon Benson

## 2015-01-05 ENCOUNTER — Encounter (HOSPITAL_COMMUNITY): Payer: Self-pay | Admitting: Otolaryngology

## 2015-01-09 NOTE — Addendum Note (Signed)
Addendum  created 01/09/15 1303 by Myrtie Soman, MD   Modules edited: Anesthesia Events, Narrator   Narrator:  Narrator: Event Log Edited

## 2015-02-09 ENCOUNTER — Other Ambulatory Visit: Payer: Self-pay | Admitting: Obstetrics and Gynecology

## 2015-02-09 DIAGNOSIS — N644 Mastodynia: Secondary | ICD-10-CM

## 2015-02-09 DIAGNOSIS — Z803 Family history of malignant neoplasm of breast: Secondary | ICD-10-CM

## 2015-02-11 ENCOUNTER — Other Ambulatory Visit (HOSPITAL_COMMUNITY): Payer: Self-pay | Admitting: Endocrinology

## 2015-02-11 DIAGNOSIS — C73 Malignant neoplasm of thyroid gland: Secondary | ICD-10-CM

## 2015-02-16 ENCOUNTER — Other Ambulatory Visit: Payer: Self-pay | Admitting: Obstetrics and Gynecology

## 2015-02-16 ENCOUNTER — Ambulatory Visit
Admission: RE | Admit: 2015-02-16 | Discharge: 2015-02-16 | Disposition: A | Discharge status: Home / Self Care | Payer: No Typology Code available for payment source | Source: Ambulatory Visit | Attending: Obstetrics and Gynecology | Admitting: Obstetrics and Gynecology

## 2015-02-16 ENCOUNTER — Telehealth: Payer: Self-pay | Admitting: *Deleted

## 2015-02-16 DIAGNOSIS — N644 Mastodynia: Secondary | ICD-10-CM

## 2015-02-16 DIAGNOSIS — Z803 Family history of malignant neoplasm of breast: Secondary | ICD-10-CM

## 2015-02-16 NOTE — Telephone Encounter (Signed)
Patient called asking if she could have a hospital admission after her radioactive iodine.  She is not a patient of any provider at the [facility].  She is seeing Dr. Janace Hoard this afternoon, who did her thyroidectomy.  Suggested she address questions and concerns to Dr. Janace Hoard at her appointment today.  Apologized for not being able to help her.

## 2015-02-23 ENCOUNTER — Encounter (HOSPITAL_COMMUNITY)
Admission: RE | Admit: 2015-02-23 | Discharge: 2015-02-23 | Disposition: A | Discharge status: Home / Self Care | Payer: No Typology Code available for payment source | Source: Ambulatory Visit | Attending: Endocrinology | Admitting: Endocrinology

## 2015-02-23 DIAGNOSIS — C73 Malignant neoplasm of thyroid gland: Secondary | ICD-10-CM | POA: Diagnosis not present

## 2015-02-23 MED ORDER — THYROTROPIN ALFA 1.1 MG IM SOLR
0.9000 mg | INTRAMUSCULAR | Status: AC
  Administered 2015-02-23: 0.9 mg via INTRAMUSCULAR

## 2015-02-24 ENCOUNTER — Encounter (HOSPITAL_COMMUNITY)
Admission: RE | Admit: 2015-02-24 | Discharge: 2015-02-24 | Disposition: A | Discharge status: Home / Self Care | Payer: No Typology Code available for payment source | Source: Ambulatory Visit | Attending: Endocrinology | Admitting: Endocrinology

## 2015-02-24 DIAGNOSIS — C73 Malignant neoplasm of thyroid gland: Secondary | ICD-10-CM | POA: Diagnosis not present

## 2015-02-24 MED ORDER — THYROTROPIN ALFA 1.1 MG IM SOLR
0.9000 mg | INTRAMUSCULAR | Status: AC
  Administered 2015-02-24: 0.9 mg via INTRAMUSCULAR

## 2015-02-25 ENCOUNTER — Encounter (HOSPITAL_COMMUNITY)
Admission: RE | Admit: 2015-02-25 | Discharge: 2015-02-25 | Disposition: A | Discharge status: Home / Self Care | Payer: No Typology Code available for payment source | Source: Ambulatory Visit | Attending: Endocrinology | Admitting: Endocrinology

## 2015-02-25 DIAGNOSIS — C73 Malignant neoplasm of thyroid gland: Secondary | ICD-10-CM | POA: Diagnosis not present

## 2015-02-25 LAB — HCG, SERUM, QUALITATIVE: Preg, Serum: NEGATIVE

## 2015-02-25 MED ORDER — SODIUM IODIDE I 131 CAPSULE
50.7000 | Freq: Once | INTRAVENOUS | Status: AC | PRN
  Administered 2015-02-25: 50.7 via ORAL

## 2015-02-28 ENCOUNTER — Emergency Department (HOSPITAL_COMMUNITY): Payer: No Typology Code available for payment source

## 2015-02-28 ENCOUNTER — Encounter (HOSPITAL_COMMUNITY): Payer: Self-pay | Admitting: Emergency Medicine

## 2015-02-28 ENCOUNTER — Emergency Department (HOSPITAL_COMMUNITY)
Admission: EM | Admit: 2015-02-28 | Discharge: 2015-02-28 | Disposition: A | Discharge status: Home / Self Care | Payer: No Typology Code available for payment source | Attending: Emergency Medicine | Admitting: Emergency Medicine

## 2015-02-28 DIAGNOSIS — Z79899 Other long term (current) drug therapy: Secondary | ICD-10-CM | POA: Diagnosis not present

## 2015-02-28 DIAGNOSIS — R531 Weakness: Secondary | ICD-10-CM | POA: Insufficient documentation

## 2015-02-28 DIAGNOSIS — M1990 Unspecified osteoarthritis, unspecified site: Secondary | ICD-10-CM | POA: Insufficient documentation

## 2015-02-28 DIAGNOSIS — Z8719 Personal history of other diseases of the digestive system: Secondary | ICD-10-CM | POA: Insufficient documentation

## 2015-02-28 DIAGNOSIS — Z8669 Personal history of other diseases of the nervous system and sense organs: Secondary | ICD-10-CM | POA: Insufficient documentation

## 2015-02-28 DIAGNOSIS — M549 Dorsalgia, unspecified: Secondary | ICD-10-CM | POA: Insufficient documentation

## 2015-02-28 DIAGNOSIS — Z87891 Personal history of nicotine dependence: Secondary | ICD-10-CM | POA: Diagnosis not present

## 2015-02-28 DIAGNOSIS — E039 Hypothyroidism, unspecified: Secondary | ICD-10-CM | POA: Diagnosis not present

## 2015-02-28 DIAGNOSIS — Z8742 Personal history of other diseases of the female genital tract: Secondary | ICD-10-CM | POA: Insufficient documentation

## 2015-02-28 LAB — CBC WITH DIFFERENTIAL/PLATELET
BASOS ABS: 0 10*3/uL (ref 0.0–0.1)
BASOS PCT: 0 % (ref 0–1)
EOS ABS: 0.1 10*3/uL (ref 0.0–0.7)
Eosinophils Relative: 1 % (ref 0–5)
HEMATOCRIT: 40.1 % (ref 36.0–46.0)
HEMOGLOBIN: 13 g/dL (ref 12.0–15.0)
LYMPHS PCT: 23 % (ref 12–46)
Lymphs Abs: 1.4 10*3/uL (ref 0.7–4.0)
MCH: 28 pg (ref 26.0–34.0)
MCHC: 32.4 g/dL (ref 30.0–36.0)
MCV: 86.4 fL (ref 78.0–100.0)
Monocytes Absolute: 0.5 10*3/uL (ref 0.1–1.0)
Monocytes Relative: 8 % (ref 3–12)
NEUTROS ABS: 4.4 10*3/uL (ref 1.7–7.7)
Neutrophils Relative %: 68 % (ref 43–77)
PLATELETS: 196 10*3/uL (ref 150–400)
RBC: 4.64 MIL/uL (ref 3.87–5.11)
RDW: 13.2 % (ref 11.5–15.5)
WBC: 6.3 10*3/uL (ref 4.0–10.5)

## 2015-02-28 LAB — COMPREHENSIVE METABOLIC PANEL
ALT: 21 U/L (ref 0–35)
AST: 31 U/L (ref 0–37)
Albumin: 3.8 g/dL (ref 3.5–5.2)
Alkaline Phosphatase: 65 U/L (ref 39–117)
Anion gap: 6 (ref 5–15)
BILIRUBIN TOTAL: 1.1 mg/dL (ref 0.3–1.2)
BUN: 13 mg/dL (ref 6–23)
CHLORIDE: 103 mmol/L (ref 96–112)
CO2: 25 mmol/L (ref 19–32)
Calcium: 9.3 mg/dL (ref 8.4–10.5)
Creatinine, Ser: 0.71 mg/dL (ref 0.50–1.10)
GFR calc non Af Amer: >90 mL/min (ref >90)
Glucose, Bld: 118 mg/dL — ABNORMAL HIGH (ref 70–99)
Potassium: 5.2 mmol/L — ABNORMAL HIGH (ref 3.5–5.1)
Sodium: 134 mmol/L — ABNORMAL LOW (ref 135–145)
TOTAL PROTEIN: 7.8 g/dL (ref 6.0–8.3)

## 2015-02-28 LAB — I-STAT BETA HCG BLOOD, ED (MC, WL, AP ONLY): I-stat hCG, quantitative: <5 m[IU]/mL (ref <5)

## 2015-02-28 MED ORDER — HYDROCODONE-ACETAMINOPHEN 5-325 MG PO TABS: 1.0000 | ORAL_TABLET | Freq: Four times a day (QID) | ORAL | Status: DC | PRN

## 2015-02-28 MED ORDER — CYCLOBENZAPRINE HCL 5 MG PO TABS: 5.0000 mg | ORAL_TABLET | Freq: Two times a day (BID) | ORAL | Status: DC | PRN

## 2015-02-28 MED ORDER — DIAZEPAM 5 MG/ML IJ SOLN
5.0000 mg | Freq: Once | INTRAMUSCULAR | Status: DC
  Filled 2015-02-28: qty 2

## 2015-02-28 MED ORDER — HYDROMORPHONE HCL 1 MG/ML IJ SOLN
1.0000 mg | Freq: Once | INTRAMUSCULAR | Status: DC
  Filled 2015-02-28: qty 1

## 2015-02-28 MED ORDER — ONDANSETRON HCL 4 MG/2ML IJ SOLN
4.0000 mg | Freq: Once | INTRAMUSCULAR | Status: DC
  Filled 2015-02-28: qty 2

## 2015-02-28 NOTE — ED Notes (Signed)
[facility] called for transport.

## 2015-02-28 NOTE — ED Provider Notes (Signed)
CSN: [id number]     Arrival date & time 02/28/15  1858 History   First MD Initiated Contact with Patient 02/28/15 1911     Chief Complaint  Patient presents with  . Tailbone Pain  . Leg Pain    left     (Consider location/radiation/quality/duration/timing/severity/associated sxs/prior Treatment) The history is provided by the patient.  Sharon Benson is a 39 y.o. female history of fibroids, recent diagnosis of thyroid cancer currently on radiation who presenting with back pain. She had radiation 3 days ago and has been in bed since then. She has not gotten out of her house and has been walking around. The last several days she's beginning having left-sided back pain radiating to her leg. Today the pain was severe was associated with some weakness. Denies any incontinence. Denies any abdominal pain or vomiting or fevers. No previous spinal surgery or injections.    Past Medical History  Diagnosis Date  . Hypothyroidism   . SVD (spontaneous vaginal delivery)     x 1  . Headache(784.0)   . Endometriosis   . Fibroid   . PONV (postoperative nausea and vomiting)     last 2- no vomiting-  Seizures- not with last 2.  . Palpitations   . Seizures     last seizure 08/11/13, last one 12/14/14-  . Anxiety     panic attack - not recent 2001ish  . GERD (gastroesophageal reflux disease)   . Arthritis    Past Surgical History  Procedure Laterality Date  . Breast reduction surgery    . Ganglion cyst excision  1999    right hand  . Dilation and evacuation N/A 11/25/2013    Procedure: DILATATION AND EVACUATION;  Surgeon: Woodroe Mode, MD;  Location: [facility];  Service: Gynecology;  Laterality: N/A;  . Dilation and curettage of uterus    . Dilation and evacuation N/A 11/30/2013    Procedure: DILATATION AND EVACUATION;  Surgeon: Jonnie Kind, MD;  Location: [facility];  Service: Gynecology;  Laterality: N/A;  . Dilation and evacuation N/A 05/02/2014    Procedure: DILATATION AND EVACUATION;   Surgeon: Ena Dawley, MD;  Location: [facility];  Service: Gynecology;  Laterality: N/A;  . Breast surgery  1997  . Thyroidectomy  01/02/2015  . Thyroidectomy Bilateral 01/02/2015    Procedure: TOTAL THYROIDECTOMY;  Surgeon: Melissa Montane, MD;  Location: [facility];  Service: ENT;  Laterality: Bilateral;   Family History  Problem Relation Age of Onset  . Lung cancer Maternal Grandmother   . Colon cancer Maternal Grandmother   . Cancer Other   . Diabetes Other   . High blood pressure Other   . Diabetes Mother   . Hypertension Father   . Hypertension Paternal Grandmother   . Diabetes Paternal Grandmother   . Heart disease Paternal Grandmother    History  Substance Use Topics  . Smoking status: Former Smoker -- 2 years    Types: Cigarettes    Quit date: 04/28/1997  . Smokeless tobacco: Never Used     Comment: smoked in HS  . Alcohol Use: No   OB History    Gravida Para Term Preterm AB TAB SAB Ectopic Multiple Living   4 1 1  2  2   1      Review of Systems  Neurological: Positive for weakness.  All other systems reviewed and are negative.     Allergies  Other; Cocoa butter; and Codeine  Home Medications   Prior to Admission  medications   Medication Sig Start Date End Date Taking? Authorizing Provider  acetaminophen (TYLENOL) 325 MG tablet Take 325 mg by mouth every 6 (six) hours as needed for moderate pain (pain).     Historical Provider, MD  Cholecalciferol (VITAMIN D-3) 5000 UNITS TABS Take 10,000 Units by mouth daily.    Historical Provider, MD  folic acid (FOLVITE) 1 MG tablet Take 4 mg by mouth daily.    Historical Provider, MD  HYDROcodone-acetaminophen (NORCO/VICODIN) 5-325 MG per tablet Take 1-2 tablets by mouth every 4 (four) hours as needed. Patient not taking: Reported on 12/22/2014 12/12/14   Charlann Lange, PA-C  HYDROcodone-acetaminophen (NORCO/VICODIN) 5-325 MG per tablet Take 1 tablet by mouth every 6 (six) hours as needed for moderate pain. 9/62/22   Jodi Marble,  MD  ibuprofen (ADVIL,MOTRIN) 800 MG tablet Take 1 tablet (800 mg total) by mouth 3 (three) times daily. Patient taking differently: Take 800 mg by mouth every 8 (eight) hours as needed for mild pain or moderate pain.  12/12/14   Charlann Lange, PA-C  Prenatal Vit-Fe Fumarate-FA (PRENATAL MULTIVITAMIN) TABS tablet Take 1 tablet by mouth at bedtime.    Historical Provider, MD  progesterone (PROMETRIUM) 100 MG capsule Take 100 mg by mouth daily.    Historical Provider, MD  thyroid (ARMOUR) 65 MG tablet Take 65 mg by mouth daily.    Historical Provider, MD   BP 119/62 mmHg  Pulse 75  Temp(Src) 98 F (36.7 C) (Oral)  Resp 18  SpO2 100%  LMP 03/13/2014 Physical Exam  Constitutional: She is oriented to person, place, and time.  Uncomfortable   HENT:  Head: Normocephalic.  Mouth/Throat: Oropharynx is clear and moist.  Eyes: Conjunctivae are normal. Pupils are equal, round, and reactive to light.  Neck: Normal range of motion. Neck supple.  Previous thyroidectomy scar healing well   Cardiovascular: Normal rate and regular rhythm.   Pulmonary/Chest: Effort normal and breath sounds normal. No respiratory distress. She has no wheezes. She has no rales.  Abdominal: Soft. Bowel sounds are normal. She exhibits no distension. There is no tenderness. There is no rebound.  Musculoskeletal: Normal range of motion.  Neurological: She is alert and oriented to person, place, and time.  Strength 4/5 L hip flexion and L plantar flexion (not sure if due to pain). + L paralumbar and thoracic tenderness. No saddle anesthesia. Nl sensation   Skin: Skin is warm and dry.  Psychiatric: She has a normal mood and affect. Her behavior is normal. Judgment and thought content normal.  Nursing note and vitals reviewed.   ED Course  Procedures (including critical care time) Labs Review Labs Reviewed  COMPREHENSIVE METABOLIC PANEL - Abnormal; Notable for the following:    Sodium 134 (*)    Potassium 5.2 (*)     Glucose, Bld 118 (*)    All other components within normal limits  CBC WITH DIFFERENTIAL/PLATELET  I-STAT BETA HCG BLOOD, ED ([facility], [facility], [facility] ONLY)    Imaging Review Mr Thoracic Spine Wo Contrast  02/28/2015   CLINICAL DATA:  Patient complains of severe LEFT leg pain and weakness with low back pain. Patient unable to bear weight.  EXAM: MRI THORACIC AND LUMBAR SPINE WITHOUT CONTRAST  TECHNIQUE: Multiplanar and multiecho pulse sequences of the thoracic and lumbar spine were obtained without intravenous contrast.  COMPARISON:  None.  FINDINGS: MR THORACIC SPINE FINDINGS  Numbering of the thoracic spine was performed by counting caudally from the odontoid. Within limits of evaluation on survey images,  no gross cervical spine or cervicomedullary pathology.  There is no evidence for disc degeneration, disc herniation, vertebral body abnormality, or paraspinous mass. Increased body habitus without thoracic epidural lipomatosis. Normal cord size and signal throughout. No intraspinal mass lesion. Paravertebral soft tissues are unremarkable.  MR LUMBAR SPINE FINDINGS  Five lumbar type vertebral bodies. Anatomic alignment except for trace retrolisthesis at L4-5, possibly facet mediated. No worrisome osseous lesion. Increased body habitus without lumbar epidural lipomatosis. Normal conus medullaris.  The L1-2, L2-3, and L3-4 disc spaces are normal  At L4-5, there is a shallow central and leftward protrusion. This slightly narrows the subarticular zone on the LEFT without compression of the L4 or L5 nerve roots.  At L5-S1, the disc is normal. There is mild facet arthropathy without stenosis, foraminal narrowing, or neural impingement.  Visualized sacroiliac joint and pelvic structures are unremarkable.  IMPRESSION: MR THORACIC SPINE IMPRESSION  Negative.  MR LUMBAR SPINE IMPRESSION  Shallow LEFT paracentral protrusion at L4-5 without clear-cut neural impingement. Otherwise unremarkable lumbar spine MRI.   Electronically  Signed   By: Rolla Flatten M.D.   On: 02/28/2015 21:21   Mr Lumbar Spine Wo Contrast  02/28/2015   CLINICAL DATA:  Patient complains of severe LEFT leg pain and weakness with low back pain. Patient unable to bear weight.  EXAM: MRI THORACIC AND LUMBAR SPINE WITHOUT CONTRAST  TECHNIQUE: Multiplanar and multiecho pulse sequences of the thoracic and lumbar spine were obtained without intravenous contrast.  COMPARISON:  None.  FINDINGS: MR THORACIC SPINE FINDINGS  Numbering of the thoracic spine was performed by counting caudally from the odontoid. Within limits of evaluation on survey images, no gross cervical spine or cervicomedullary pathology.  There is no evidence for disc degeneration, disc herniation, vertebral body abnormality, or paraspinous mass. Increased body habitus without thoracic epidural lipomatosis. Normal cord size and signal throughout. No intraspinal mass lesion. Paravertebral soft tissues are unremarkable.  MR LUMBAR SPINE FINDINGS  Five lumbar type vertebral bodies. Anatomic alignment except for trace retrolisthesis at L4-5, possibly facet mediated. No worrisome osseous lesion. Increased body habitus without lumbar epidural lipomatosis. Normal conus medullaris.  The L1-2, L2-3, and L3-4 disc spaces are normal  At L4-5, there is a shallow central and leftward protrusion. This slightly narrows the subarticular zone on the LEFT without compression of the L4 or L5 nerve roots.  At L5-S1, the disc is normal. There is mild facet arthropathy without stenosis, foraminal narrowing, or neural impingement.  Visualized sacroiliac joint and pelvic structures are unremarkable.  IMPRESSION: MR THORACIC SPINE IMPRESSION  Negative.  MR LUMBAR SPINE IMPRESSION  Shallow LEFT paracentral protrusion at L4-5 without clear-cut neural impingement. Otherwise unremarkable lumbar spine MRI.   Electronically Signed   By: Rolla Flatten M.D.   On: 02/28/2015 21:21     EKG Interpretation None      MDM   Final  diagnoses:  Back pain   Sharon Benson is a 39 y.o. female here with back pain. Has weakness L leg not sure if it is pain related or true weakness. She had 100 mcg fentanyl by EMS already and is still weak so will get MRI to r/o spinal compression.   9:30 PM Pain improved. Refused pain meds here. MRI showed L 4-5 protrusion with no obvious impingement. I think likely its contributing to her symptoms. She has more strength l leg now. Will dc home with vicodin, flexeril.    Wandra Arthurs, MD 02/28/15 2131

## 2015-02-28 NOTE — ED Notes (Signed)
Pt. REFUSED all meds ordered including pain medicine, stated that she already have so much medicine in her body and making her more sick of her stomach. Alert and oriented 3, To notify MD.

## 2015-02-28 NOTE — Discharge Instructions (Signed)
Take motrin for pain.   Take flexeril for muscle spasms.   Take vicodin for severe pain.   See ortho spine doctor for evaluation.   Return to ER if you have worse pain, trouble walking.

## 2015-02-28 NOTE — ED Notes (Addendum)
Per EMS pt comes from home c/o tailbone pain that radiates to her left leg that started today. Pt was given Fentanyl 112mcg in route. Pt had radiation pills yesterday and is on radioactive precautions at home.

## 2015-02-28 NOTE — ED Notes (Signed)
Bed: SJ62 Expected date: 02/28/15 Expected time: 6:38 PM Means of arrival: Ambulance Comments: Ca pt unable to walk recent chemo

## 2015-02-28 NOTE — ED Notes (Signed)
Patient transported to MRI 

## 2015-03-04 ENCOUNTER — Encounter (HOSPITAL_COMMUNITY)
Admission: RE | Admit: 2015-03-04 | Discharge: 2015-03-04 | Disposition: A | Discharge status: Home / Self Care | Payer: No Typology Code available for payment source | Source: Ambulatory Visit | Attending: Endocrinology | Admitting: Endocrinology

## 2015-03-04 DIAGNOSIS — C73 Malignant neoplasm of thyroid gland: Secondary | ICD-10-CM | POA: Insufficient documentation

## 2015-06-12 ENCOUNTER — Other Ambulatory Visit: Payer: Self-pay | Admitting: Family Medicine

## 2015-06-16 ENCOUNTER — Other Ambulatory Visit: Payer: Self-pay | Admitting: Family Medicine

## 2015-06-16 DIAGNOSIS — N6310 Unspecified lump in the right breast, unspecified quadrant: Secondary | ICD-10-CM

## 2015-06-18 ENCOUNTER — Ambulatory Visit
Admission: RE | Admit: 2015-06-18 | Discharge: 2015-06-18 | Disposition: A | Discharge status: Home / Self Care | Payer: No Typology Code available for payment source | Source: Ambulatory Visit | Attending: Family Medicine | Admitting: Family Medicine

## 2015-06-18 ENCOUNTER — Other Ambulatory Visit: Payer: Self-pay | Admitting: Family Medicine

## 2015-06-18 DIAGNOSIS — R2231 Localized swelling, mass and lump, right upper limb: Secondary | ICD-10-CM

## 2015-06-18 DIAGNOSIS — N6310 Unspecified lump in the right breast, unspecified quadrant: Secondary | ICD-10-CM

## 2015-07-05 ENCOUNTER — Emergency Department (HOSPITAL_COMMUNITY)
Admission: EM | Admit: 2015-07-05 | Discharge: 2015-07-05 | Disposition: A | Discharge status: Home / Self Care | Payer: No Typology Code available for payment source | Attending: Emergency Medicine | Admitting: Emergency Medicine

## 2015-07-05 ENCOUNTER — Encounter (HOSPITAL_COMMUNITY): Payer: Self-pay | Admitting: *Deleted

## 2015-07-05 ENCOUNTER — Emergency Department (HOSPITAL_COMMUNITY): Payer: No Typology Code available for payment source

## 2015-07-05 DIAGNOSIS — Z87891 Personal history of nicotine dependence: Secondary | ICD-10-CM | POA: Diagnosis not present

## 2015-07-05 DIAGNOSIS — R0789 Other chest pain: Secondary | ICD-10-CM | POA: Insufficient documentation

## 2015-07-05 DIAGNOSIS — Z8742 Personal history of other diseases of the female genital tract: Secondary | ICD-10-CM | POA: Diagnosis not present

## 2015-07-05 DIAGNOSIS — R062 Wheezing: Secondary | ICD-10-CM | POA: Insufficient documentation

## 2015-07-05 DIAGNOSIS — R0600 Dyspnea, unspecified: Secondary | ICD-10-CM | POA: Diagnosis not present

## 2015-07-05 DIAGNOSIS — R05 Cough: Secondary | ICD-10-CM | POA: Diagnosis not present

## 2015-07-05 DIAGNOSIS — Z8659 Personal history of other mental and behavioral disorders: Secondary | ICD-10-CM | POA: Insufficient documentation

## 2015-07-05 DIAGNOSIS — Z79899 Other long term (current) drug therapy: Secondary | ICD-10-CM | POA: Insufficient documentation

## 2015-07-05 DIAGNOSIS — R51 Headache: Secondary | ICD-10-CM | POA: Insufficient documentation

## 2015-07-05 DIAGNOSIS — R059 Cough, unspecified: Secondary | ICD-10-CM

## 2015-07-05 DIAGNOSIS — R0602 Shortness of breath: Secondary | ICD-10-CM | POA: Insufficient documentation

## 2015-07-05 DIAGNOSIS — R42 Dizziness and giddiness: Secondary | ICD-10-CM | POA: Insufficient documentation

## 2015-07-05 DIAGNOSIS — E039 Hypothyroidism, unspecified: Secondary | ICD-10-CM | POA: Diagnosis not present

## 2015-07-05 DIAGNOSIS — Z8719 Personal history of other diseases of the digestive system: Secondary | ICD-10-CM | POA: Insufficient documentation

## 2015-07-05 DIAGNOSIS — M1990 Unspecified osteoarthritis, unspecified site: Secondary | ICD-10-CM | POA: Diagnosis not present

## 2015-07-05 DIAGNOSIS — Z86018 Personal history of other benign neoplasm: Secondary | ICD-10-CM | POA: Insufficient documentation

## 2015-07-05 LAB — D-DIMER, QUANTITATIVE (NOT AT ARMC): D DIMER QUANT: 1.94 ug{FEU}/mL — AB (ref 0.00–0.48)

## 2015-07-05 LAB — CBC WITH DIFFERENTIAL/PLATELET
Basophils Absolute: 0 10*3/uL (ref 0.0–0.1)
Basophils Relative: 0 % (ref 0–1)
Eosinophils Absolute: 0.1 10*3/uL (ref 0.0–0.7)
Eosinophils Relative: 1 % (ref 0–5)
HEMATOCRIT: 40.5 % (ref 36.0–46.0)
HEMOGLOBIN: 12.9 g/dL (ref 12.0–15.0)
LYMPHS PCT: 28 % (ref 12–46)
Lymphs Abs: 1.8 10*3/uL (ref 0.7–4.0)
MCH: 27 pg (ref 26.0–34.0)
MCHC: 31.9 g/dL (ref 30.0–36.0)
MCV: 84.7 fL (ref 78.0–100.0)
MONO ABS: 0.5 10*3/uL (ref 0.1–1.0)
MONOS PCT: 7 % (ref 3–12)
Neutro Abs: 4.3 10*3/uL (ref 1.7–7.7)
Neutrophils Relative %: 64 % (ref 43–77)
Platelets: 263 10*3/uL (ref 150–400)
RBC: 4.78 MIL/uL (ref 3.87–5.11)
RDW: 13.9 % (ref 11.5–15.5)
WBC: 6.6 10*3/uL (ref 4.0–10.5)

## 2015-07-05 LAB — BASIC METABOLIC PANEL
Anion gap: 7 (ref 5–15)
BUN: 14 mg/dL (ref 6–20)
CO2: 29 mmol/L (ref 22–32)
Calcium: 9.5 mg/dL (ref 8.9–10.3)
Chloride: 103 mmol/L (ref 101–111)
Creatinine, Ser: 0.82 mg/dL (ref 0.44–1.00)
GFR calc Af Amer: >60 mL/min (ref >60)
GLUCOSE: 83 mg/dL (ref 65–99)
POTASSIUM: 3.9 mmol/L (ref 3.5–5.1)
Sodium: 139 mmol/L (ref 135–145)

## 2015-07-05 LAB — BRAIN NATRIURETIC PEPTIDE: B Natriuretic Peptide: 35.5 pg/mL (ref 0.0–100.0)

## 2015-07-05 LAB — TROPONIN I: Troponin I: <0.03 ng/mL (ref <0.031)

## 2015-07-05 MED ORDER — DEXAMETHASONE SODIUM PHOSPHATE 10 MG/ML IJ SOLN
10.0000 mg | Freq: Once | INTRAMUSCULAR | Status: AC
  Administered 2015-07-05: 10 mg via INTRAVENOUS
  Filled 2015-07-05: qty 1

## 2015-07-05 MED ORDER — ALBUTEROL SULFATE HFA 108 (90 BASE) MCG/ACT IN AERS: 1.0000 | INHALATION_SPRAY | Freq: Four times a day (QID) | RESPIRATORY_TRACT | Status: DC | PRN

## 2015-07-05 MED ORDER — SODIUM CHLORIDE 0.9 % IV SOLN
Freq: Once | INTRAVENOUS | Status: AC
  Administered 2015-07-05: 14:00:00 via INTRAVENOUS

## 2015-07-05 MED ORDER — IOHEXOL 350 MG/ML SOLN
100.0000 mL | Freq: Once | INTRAVENOUS | Status: AC | PRN
  Administered 2015-07-05: 100 mL via INTRAVENOUS

## 2015-07-05 MED ORDER — IPRATROPIUM-ALBUTEROL 0.5-2.5 (3) MG/3ML IN SOLN
3.0000 mL | Freq: Once | RESPIRATORY_TRACT | Status: AC
  Administered 2015-07-05: 3 mL via RESPIRATORY_TRACT
  Filled 2015-07-05: qty 3

## 2015-07-05 NOTE — ED Provider Notes (Signed)
CSN: [id number]     Arrival date & time 07/05/15  1040 History   First MD Initiated Contact with Patient 07/05/15 1114     Chief Complaint  Patient presents with  . Cough  . Headache  . Shortness of Breath     (Consider location/radiation/quality/duration/timing/severity/associated sxs/prior Treatment) HPI Comments: Patient has intermittent wheezing worse at night interest breath worse with lying flat. 39 year old female with history of migraines presents with cough, chest tightness and shortness of breath worsening for the past 3-4 weeks. Patient had radiation treatment for thyroid cancer no chemotherapy. Patient has not had fever or chills. No hemoptysis. No blood clot history or heart failure history. Patient was told that she might have fluid on her lungs in the past but no diagnosis of heart failure, no worsening leg swelling or calf pain. Patient feels symptoms have been worse since radiation treatment.  Patient is a 39 y.o. female presenting with cough, headaches, and shortness of breath. The history is provided by the patient.  Cough Associated symptoms: headaches, shortness of breath and wheezing   Associated symptoms: no chest pain, no chills, no fever and no rash   Headache Associated symptoms: cough   Associated symptoms: no abdominal pain, no back pain, no congestion, no fever, no neck pain, no neck stiffness and no vomiting   Shortness of Breath Associated symptoms: cough, headaches and wheezing   Associated symptoms: no abdominal pain, no chest pain, no fever, no neck pain, no rash and no vomiting     Past Medical History  Diagnosis Date  . Hypothyroidism   . SVD (spontaneous vaginal delivery)     x 1  . Headache(784.0)   . Endometriosis   . Fibroid   . PONV (postoperative nausea and vomiting)     last 2- no vomiting-  Seizures- not with last 2.  . Palpitations   . Seizures     last seizure 08/11/13, last one 12/14/14-  . Anxiety     panic attack - not recent  2001ish  . GERD (gastroesophageal reflux disease)   . Arthritis    Past Surgical History  Procedure Laterality Date  . Breast reduction surgery    . Ganglion cyst excision  1999    right hand  . Dilation and evacuation N/A 11/25/2013    Procedure: DILATATION AND EVACUATION;  Surgeon: Woodroe Mode, MD;  Location: [facility];  Service: Gynecology;  Laterality: N/A;  . Dilation and curettage of uterus    . Dilation and evacuation N/A 11/30/2013    Procedure: DILATATION AND EVACUATION;  Surgeon: Jonnie Kind, MD;  Location: [facility];  Service: Gynecology;  Laterality: N/A;  . Dilation and evacuation N/A 05/02/2014    Procedure: DILATATION AND EVACUATION;  Surgeon: Ena Dawley, MD;  Location: [facility];  Service: Gynecology;  Laterality: N/A;  . Breast surgery  1997  . Thyroidectomy  01/02/2015  . Thyroidectomy Bilateral 01/02/2015    Procedure: TOTAL THYROIDECTOMY;  Surgeon: Melissa Montane, MD;  Location: [facility];  Service: ENT;  Laterality: Bilateral;   Family History  Problem Relation Age of Onset  . Lung cancer Maternal Grandmother   . Colon cancer Maternal Grandmother   . Cancer Other   . Diabetes Other   . High blood pressure Other   . Diabetes Mother   . Hypertension Father   . Hypertension Paternal Grandmother   . Diabetes Paternal Grandmother   . Heart disease Paternal Grandmother    History  Substance Use Topics  .  Smoking status: Former Smoker -- 2 years    Types: Cigarettes    Quit date: 04/28/1997  . Smokeless tobacco: Never Used     Comment: smoked in HS  . Alcohol Use: No   OB History    Gravida Para Term Preterm AB TAB SAB Ectopic Multiple Living   4 1 1  2  2   1      Review of Systems  Constitutional: Negative for fever and chills.  HENT: Negative for congestion.   Eyes: Negative for visual disturbance.  Respiratory: Positive for cough, chest tightness, shortness of breath and wheezing.   Cardiovascular: Negative for chest pain and leg swelling.   Gastrointestinal: Negative for vomiting and abdominal pain.  Genitourinary: Negative for dysuria and flank pain.  Musculoskeletal: Negative for back pain, neck pain and neck stiffness.  Skin: Negative for rash.  Neurological: Positive for light-headedness and headaches.      Allergies  Other; Cocoa butter; and Codeine  Home Medications   Prior to Admission medications   Medication Sig Start Date End Date Taking? Authorizing Provider  albuterol (PROVENTIL HFA;VENTOLIN HFA) 108 (90 BASE) MCG/ACT inhaler Inhale 1 puff into the lungs every 6 (six) hours as needed for wheezing or shortness of breath.   Yes Historical Provider, MD  Cholecalciferol (VITAMIN D-3) 5000 UNITS TABS Take 10,000 Units by mouth daily.   Yes Historical Provider, MD  ibuprofen (ADVIL,MOTRIN) 800 MG tablet Take 1 tablet (800 mg total) by mouth 3 (three) times daily. Patient taking differently: Take 800 mg by mouth every 8 (eight) hours as needed for mild pain or moderate pain.  12/12/14  Yes Shari Upstill, PA-C  thyroid (ARMOUR) 65 MG tablet Take 65 mg by mouth daily.   Yes Historical Provider, MD  albuterol (PROVENTIL HFA;VENTOLIN HFA) 108 (90 BASE) MCG/ACT inhaler Inhale 1-2 puffs into the lungs every 6 (six) hours as needed for wheezing or shortness of breath. 07/05/15   Elnora Morrison, MD  cyclobenzaprine (FLEXERIL) 5 MG tablet Take 1 tablet (5 mg total) by mouth 2 (two) times daily as needed for muscle spasms. Patient not taking: Reported on 07/05/2015 02/28/15   Wandra Arthurs, MD  HYDROcodone-acetaminophen (NORCO/VICODIN) 5-325 MG per tablet Take 1 tablet by mouth every 6 (six) hours as needed for moderate pain. Patient not taking: Reported on 07/05/2015 02/28/15   Wandra Arthurs, MD   BP 126/64 mmHg  Pulse 84  Temp(Src) 98.5 F (36.9 C) (Oral)  Resp 16  SpO2 96%  LMP 05/05/2015 Physical Exam  Constitutional: She is oriented to person, place, and time. She appears well-developed and well-nourished.  HENT:  Head:  Normocephalic and atraumatic.  Eyes: Conjunctivae are normal. Right eye exhibits no discharge. Left eye exhibits no discharge.  Neck: Normal range of motion. Neck supple. No tracheal deviation present.  Cardiovascular: Normal rate and regular rhythm.   Pulmonary/Chest: Effort normal. She has wheezes (mild end expiratory).  Abdominal: Soft. She exhibits no distension. There is no tenderness. There is no guarding.  Musculoskeletal: She exhibits no edema or tenderness.  Neurological: She is alert and oriented to person, place, and time.  Skin: Skin is warm. No rash noted.  Psychiatric: She has a normal mood and affect.  Nursing note and vitals reviewed.   ED Course  Procedures (including critical care time) Labs Review Labs Reviewed  D-DIMER, QUANTITATIVE (NOT AT [facility]) - Abnormal; Notable for the following:    D-Dimer, Quant 1.94 (*)    All other components within normal limits  BASIC METABOLIC PANEL  BRAIN NATRIURETIC PEPTIDE  CBC WITH DIFFERENTIAL/PLATELET  TROPONIN I    Imaging Review Dg Chest 2 View  07/05/2015   CLINICAL DATA:  Wheezing, coughing, shortness of breath, and chest pressure for 1 month. Former smoker.  EXAM: CHEST  2 VIEW  COMPARISON:  01/02/2015  FINDINGS: The cardiomediastinal silhouette is within normal limits. The lungs are slightly less well inflated than on the prior study. No airspace consolidation, edema, pleural effusion, or pneumothorax is identified. No acute osseous abnormality is seen.  IMPRESSION: No active cardiopulmonary disease.   Electronically Signed   By: Logan Bores   On: 07/05/2015 12:27   Ct Angio Chest Pe W/cm &/or Wo Cm  07/05/2015   CLINICAL DATA:  Productive cough for 2 months. Shortness of breath for 3 weeks with chest tightness.  EXAM: CT ANGIOGRAPHY CHEST WITH CONTRAST  TECHNIQUE: Multidetector CT imaging of the chest was performed using the standard protocol during bolus administration of intravenous contrast. Multiplanar CT image  reconstructions and MIPs were obtained to evaluate the vascular anatomy.  CONTRAST:  155mL OMNIPAQUE IOHEXOL 350 MG/ML SOLN  COMPARISON:  Chest radiographs earlier today  FINDINGS: The examination is mildly limited by patient body habitus and mild motion artifact. Allowing for this, no pulmonary emboli are identified.  Surgical clips are present in the lower neck related to prior thyroidectomy. No enlarged axillary, mediastinal, or hilar lymph nodes are identified. Heart is normal in size. No pleural or pericardial effusion is identified.  There is minimal dependent atelectasis bilaterally. No lung consolidation or nodules are identified.  The visualized portion of the upper abdomen is unremarkable. No acute osseous abnormality is seen.  Review of the MIP images confirms the above findings.  IMPRESSION: No evidence of pulmonary emboli.   Electronically Signed   By: Logan Bores   On: 07/05/2015 13:54     EKG Interpretation   Date/Time:  Sunday July 05 2015 11:04:10 EDT Ventricular Rate:  75 PR Interval:  149 QRS Duration: 97 QT Interval:  386 QTC Calculation: 431 R Axis:   71 Text Interpretation:  Sinus rhythm RSR' in V1 or V2, right VCD or RVH  similar to previous Confirmed by Ashe Gago  MD, Shiann Kam ([id number]) on 07/05/2015  11:29:11 AM      MDM   Final diagnoses:  Chest tightness  Cough  Dyspnea   Patient presents with worsening shortness of breath chest tightness and cough. Likely bronchitis/viral vs radiation/thyroid related however with recent cancer treatment and shortness of breath patient is low risk for blood clot plan for d-dimer. Cardiac screen. No respiratory difficulty on exam vitals normal. Steroids and albuterol ordered.  D-dimer positive, CT scan results reviewed no blood clot. Patient stable minimal symptoms and reassessment. Patient stable for outpatient follow.  Results and differential diagnosis were discussed with the patient/parent/guardian. Xrays were independently  reviewed by myself.  Close follow up outpatient was discussed, comfortable with the plan.   Medications  dexamethasone (DECADRON) injection 10 mg (10 mg Intravenous Given 07/05/15 1210)  ipratropium-albuterol (DUONEB) 0.5-2.5 (3) MG/3ML nebulizer solution 3 mL (3 mLs Nebulization Given 07/05/15 1210)  0.9 %  sodium chloride infusion ( Intravenous New Bag/Given 07/05/15 1332)  iohexol (OMNIPAQUE) 350 MG/ML injection 100 mL (100 mLs Intravenous Contrast Given 07/05/15 1336)    Filed Vitals:   07/05/15 1049 07/05/15 1301  BP: 128/71 126/64  Pulse: 78 84  Temp: 98.9 F (37.2 C) 98.5 F (36.9 C)  TempSrc: Oral Oral  Resp: 16 16  SpO2: 97% 96%    Final diagnoses:  Chest tightness  Cough  Dyspnea       Elnora Morrison, MD 07/05/15 1429

## 2015-07-05 NOTE — ED Notes (Addendum)
Pt report productive cough x2 months, sob x3 weeks with chest tightness (8/10), frontal HA started today. Denies n/v, light or sound sensitivity.

## 2015-07-05 NOTE — ED Notes (Signed)
Unable to collect labs at this time patient is in xray 

## 2015-07-05 NOTE — Discharge Instructions (Signed)
If you were given medicines take as directed.  If you are on coumadin or contraceptives realize their levels and effectiveness is altered by many different medicines.  If you have any reaction (rash, tongues swelling, other) to the medicines stop taking and see a physician.    If your blood pressure was elevated in the ER make sure you follow up for management with a primary doctor or return for chest pain, shortness of breath or stroke symptoms.  Please follow up as directed and return to the ER or see a physician for new or worsening symptoms.  Thank you. Filed Vitals:   07/05/15 1049 07/05/15 1301  BP: 128/71 126/64  Pulse: 78 84  Temp: 98.9 F (37.2 C) 98.5 F (36.9 C)  TempSrc: Oral Oral  Resp: 16 16  SpO2: 97% 96%

## 2015-08-04 ENCOUNTER — Emergency Department (HOSPITAL_COMMUNITY)
Admission: EM | Admit: 2015-08-04 | Discharge: 2015-08-04 | Disposition: A | Discharge status: Home / Self Care | Payer: No Typology Code available for payment source | Attending: Emergency Medicine | Admitting: Emergency Medicine

## 2015-08-04 ENCOUNTER — Encounter (HOSPITAL_COMMUNITY): Payer: Self-pay | Admitting: Emergency Medicine

## 2015-08-04 ENCOUNTER — Emergency Department (HOSPITAL_COMMUNITY): Payer: No Typology Code available for payment source

## 2015-08-04 DIAGNOSIS — Z86018 Personal history of other benign neoplasm: Secondary | ICD-10-CM | POA: Diagnosis not present

## 2015-08-04 DIAGNOSIS — Z8659 Personal history of other mental and behavioral disorders: Secondary | ICD-10-CM | POA: Insufficient documentation

## 2015-08-04 DIAGNOSIS — Z87891 Personal history of nicotine dependence: Secondary | ICD-10-CM | POA: Diagnosis not present

## 2015-08-04 DIAGNOSIS — Y998 Other external cause status: Secondary | ICD-10-CM | POA: Diagnosis not present

## 2015-08-04 DIAGNOSIS — Z8719 Personal history of other diseases of the digestive system: Secondary | ICD-10-CM | POA: Insufficient documentation

## 2015-08-04 DIAGNOSIS — M1990 Unspecified osteoarthritis, unspecified site: Secondary | ICD-10-CM | POA: Insufficient documentation

## 2015-08-04 DIAGNOSIS — Z79899 Other long term (current) drug therapy: Secondary | ICD-10-CM | POA: Insufficient documentation

## 2015-08-04 DIAGNOSIS — Y9289 Other specified places as the place of occurrence of the external cause: Secondary | ICD-10-CM | POA: Insufficient documentation

## 2015-08-04 DIAGNOSIS — E039 Hypothyroidism, unspecified: Secondary | ICD-10-CM | POA: Insufficient documentation

## 2015-08-04 DIAGNOSIS — Z8742 Personal history of other diseases of the female genital tract: Secondary | ICD-10-CM | POA: Diagnosis not present

## 2015-08-04 DIAGNOSIS — M79674 Pain in right toe(s): Secondary | ICD-10-CM

## 2015-08-04 DIAGNOSIS — X58XXXA Exposure to other specified factors, initial encounter: Secondary | ICD-10-CM | POA: Insufficient documentation

## 2015-08-04 DIAGNOSIS — Y9389 Activity, other specified: Secondary | ICD-10-CM | POA: Insufficient documentation

## 2015-08-04 DIAGNOSIS — S90111A Contusion of right great toe without damage to nail, initial encounter: Secondary | ICD-10-CM | POA: Diagnosis not present

## 2015-08-04 DIAGNOSIS — S99921A Unspecified injury of right foot, initial encounter: Secondary | ICD-10-CM | POA: Diagnosis present
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
## 2015-08-04 NOTE — Discharge Instructions (Signed)
May take tylenol or motrin as needed for pain. Return to the ED for new or worsening symptoms.
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
## 2015-09-28 IMAGING — CR DG CHEST 2V
2 series · 2 of 2 positions shown · non-contrast
Comparison: February 26, 2014

CLINICAL DATA: Preoperative for thyroidectomy

EXAM:
CHEST  2 VIEW

[w chest pa]
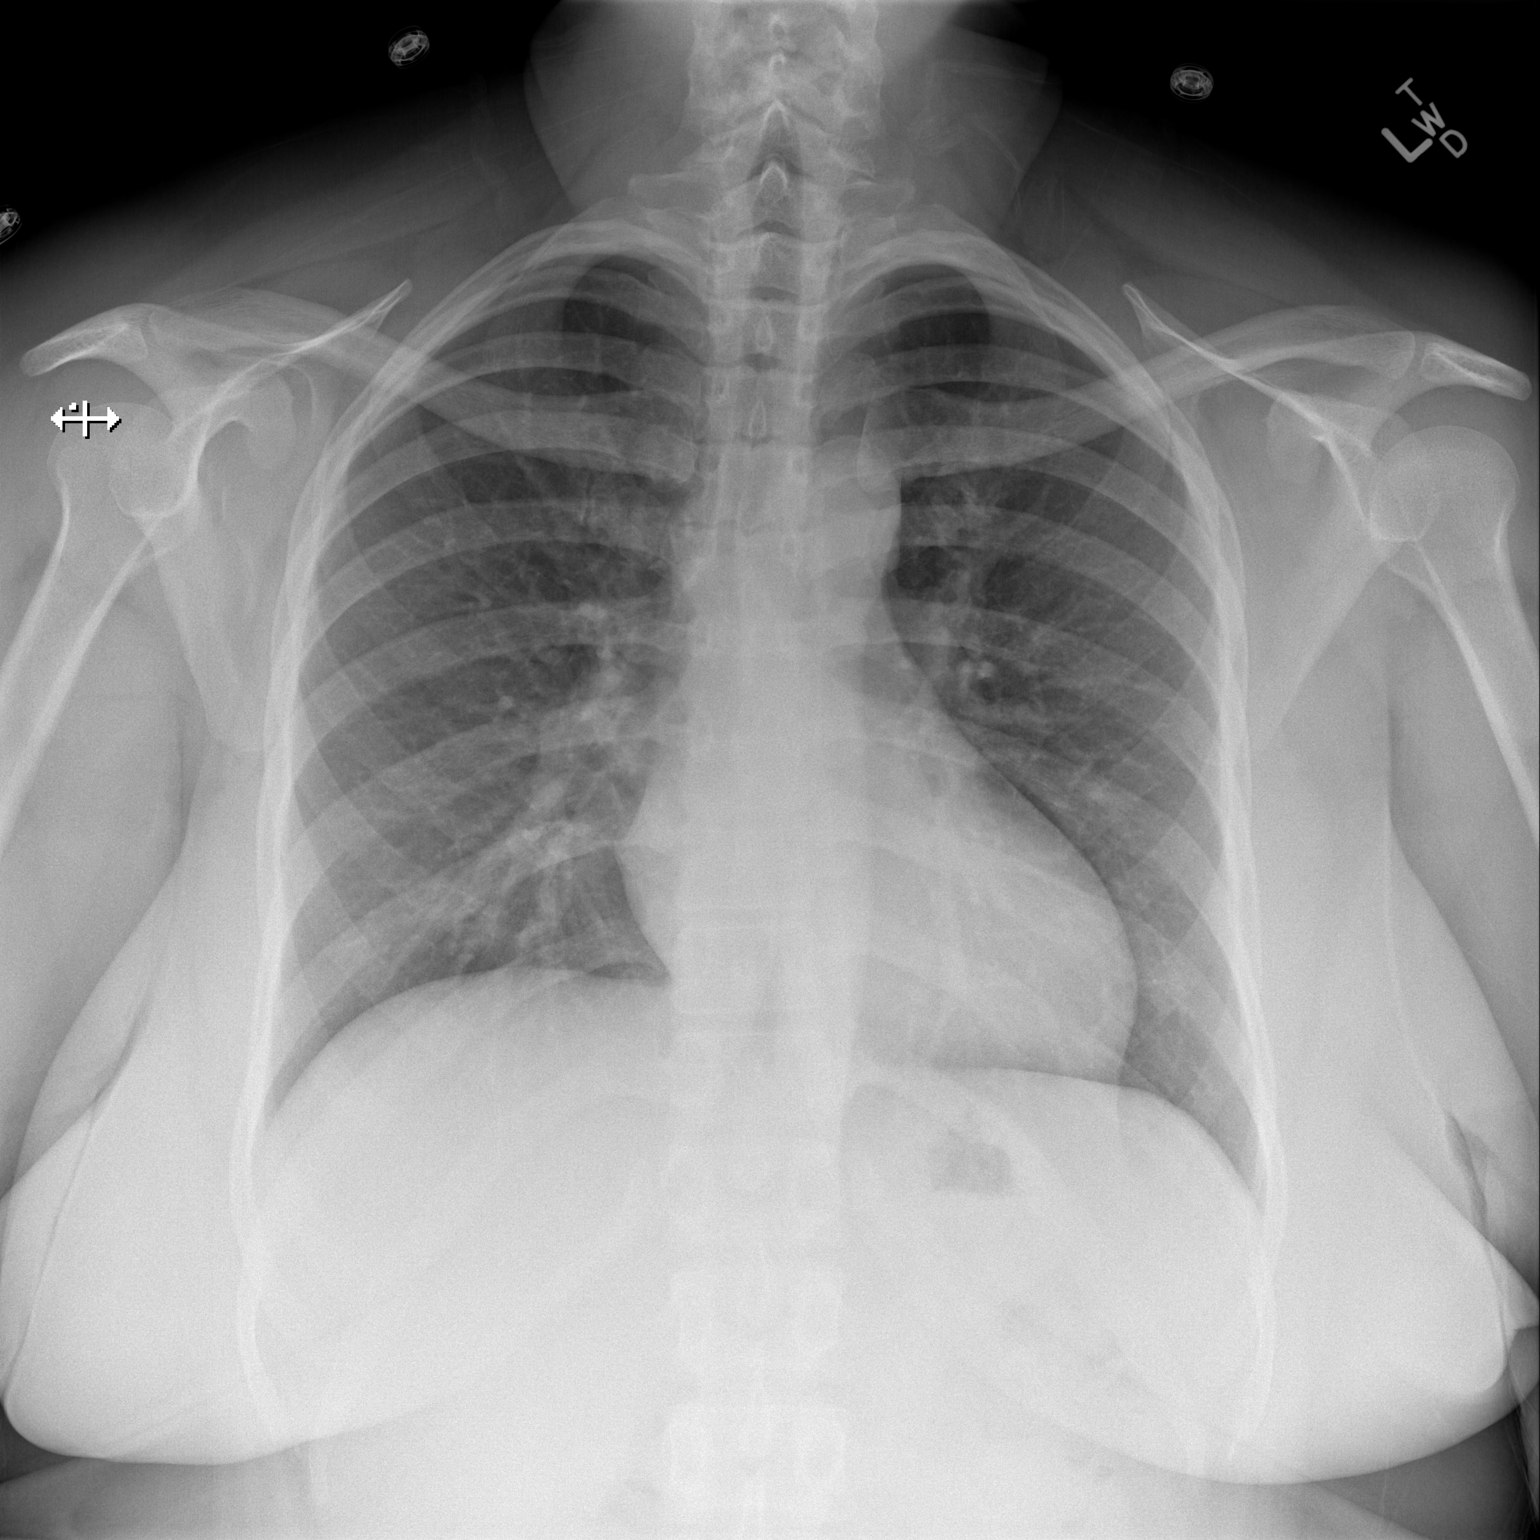

[w chest lat]
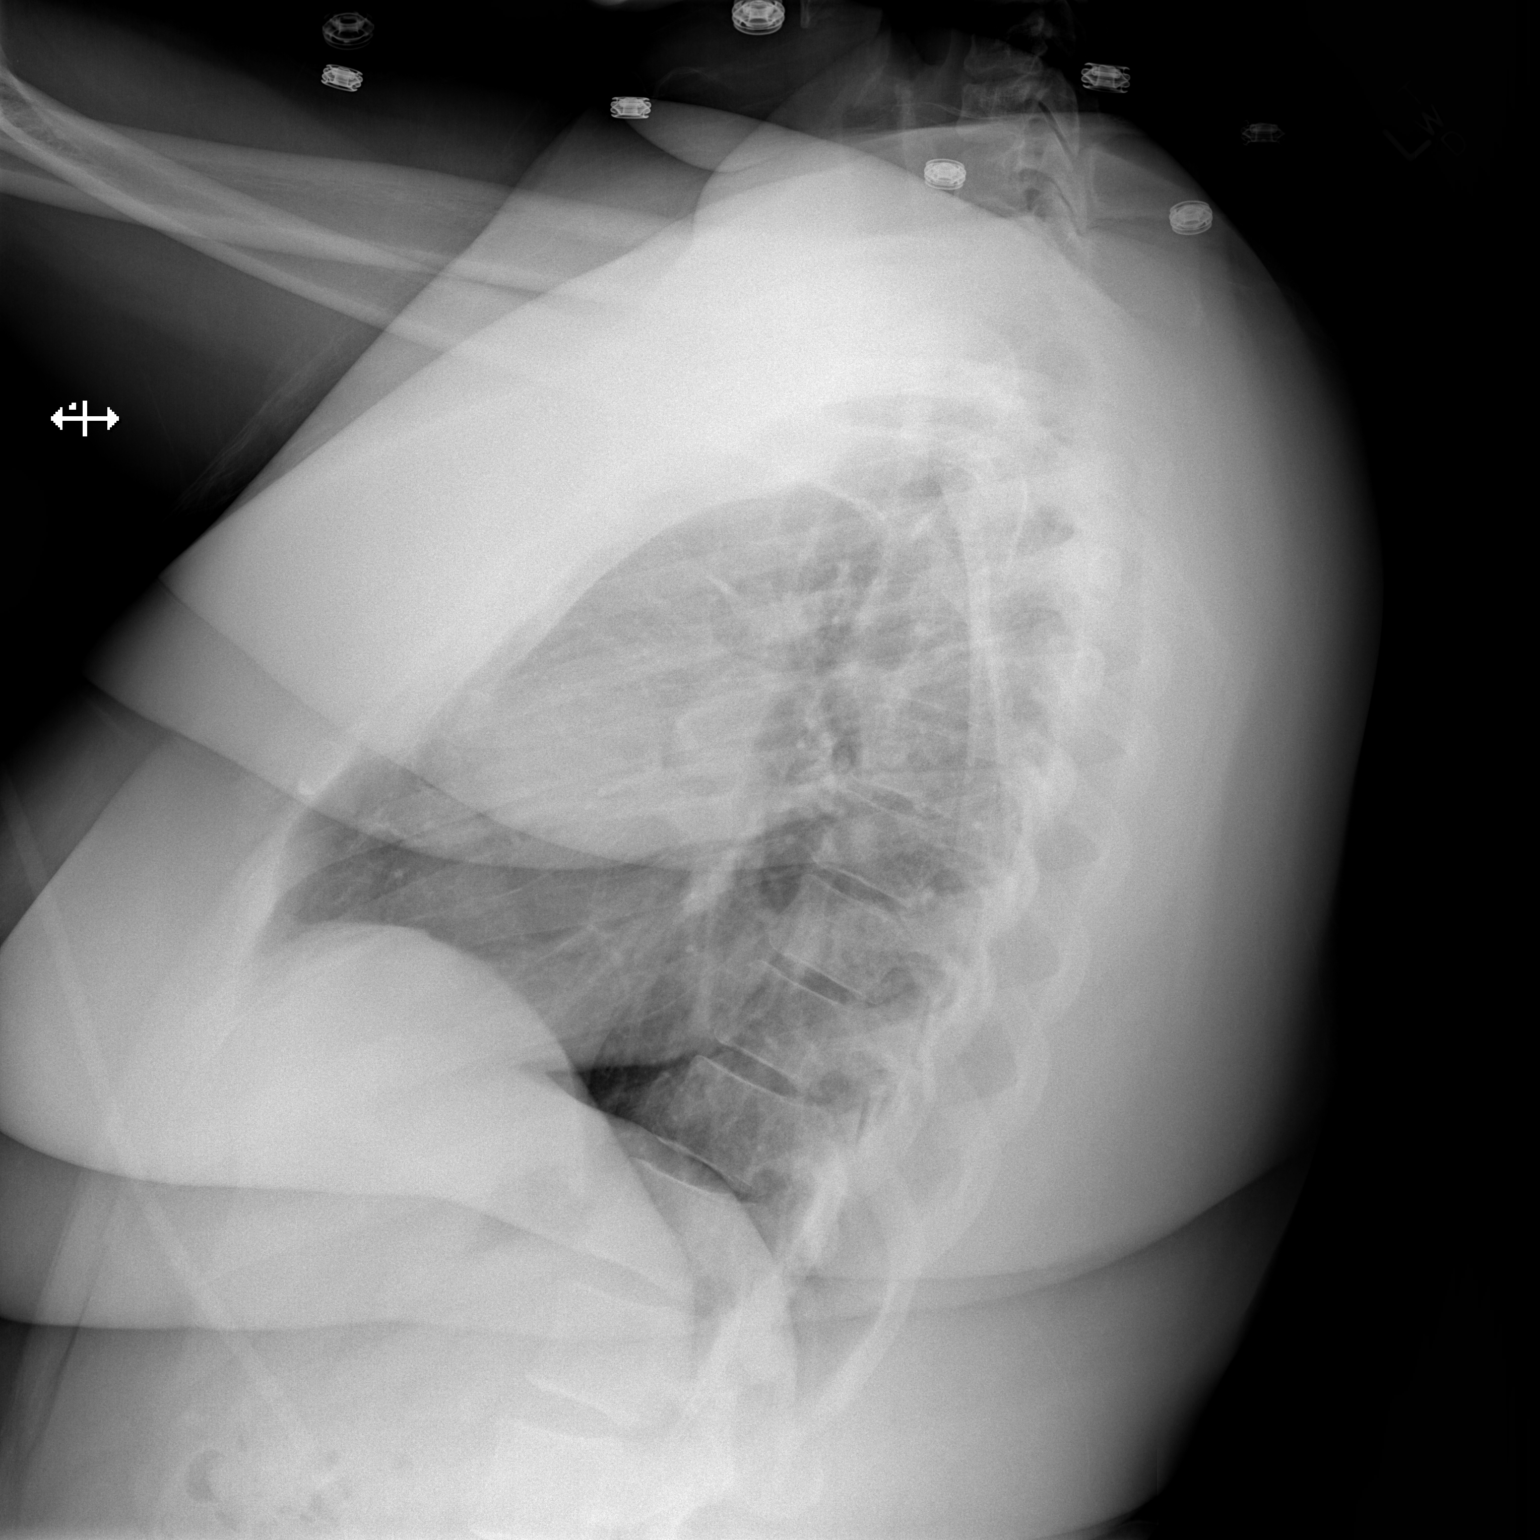

[2 of 2 positions shown; findings below may reference images not displayed]

FINDINGS: The lungs are clear. Heart size and pulmonary vascularity are
normal. No adenopathy. Trachea is midline. No bone lesions.
IMPRESSION: No abnormality noted.

## 2015-10-09 ENCOUNTER — Emergency Department (HOSPITAL_COMMUNITY): Payer: No Typology Code available for payment source

## 2015-10-09 ENCOUNTER — Emergency Department (HOSPITAL_COMMUNITY)
Admission: EM | Admit: 2015-10-09 | Discharge: 2015-10-09 | Disposition: A | Discharge status: Home / Self Care | Payer: No Typology Code available for payment source | Attending: Emergency Medicine | Admitting: Emergency Medicine

## 2015-10-09 ENCOUNTER — Encounter (HOSPITAL_COMMUNITY): Payer: Self-pay | Admitting: Emergency Medicine

## 2015-10-09 DIAGNOSIS — R000 Tachycardia, unspecified: Secondary | ICD-10-CM | POA: Diagnosis present

## 2015-10-09 DIAGNOSIS — Z79899 Other long term (current) drug therapy: Secondary | ICD-10-CM | POA: Insufficient documentation

## 2015-10-09 DIAGNOSIS — R0602 Shortness of breath: Secondary | ICD-10-CM | POA: Diagnosis not present

## 2015-10-09 DIAGNOSIS — R002 Palpitations: Secondary | ICD-10-CM | POA: Insufficient documentation

## 2015-10-09 DIAGNOSIS — Z86018 Personal history of other benign neoplasm: Secondary | ICD-10-CM | POA: Insufficient documentation

## 2015-10-09 DIAGNOSIS — Z87891 Personal history of nicotine dependence: Secondary | ICD-10-CM | POA: Insufficient documentation

## 2015-10-09 DIAGNOSIS — Z3202 Encounter for pregnancy test, result negative: Secondary | ICD-10-CM | POA: Insufficient documentation

## 2015-10-09 DIAGNOSIS — E039 Hypothyroidism, unspecified: Secondary | ICD-10-CM | POA: Insufficient documentation

## 2015-10-09 DIAGNOSIS — Z8659 Personal history of other mental and behavioral disorders: Secondary | ICD-10-CM | POA: Diagnosis not present

## 2015-10-09 DIAGNOSIS — M1990 Unspecified osteoarthritis, unspecified site: Secondary | ICD-10-CM | POA: Insufficient documentation

## 2015-10-09 DIAGNOSIS — Z8742 Personal history of other diseases of the female genital tract: Secondary | ICD-10-CM | POA: Insufficient documentation

## 2015-10-09 DIAGNOSIS — Z8719 Personal history of other diseases of the digestive system: Secondary | ICD-10-CM | POA: Insufficient documentation

## 2015-10-09 DIAGNOSIS — E669 Obesity, unspecified: Secondary | ICD-10-CM | POA: Diagnosis not present

## 2015-10-09 LAB — CBC WITH DIFFERENTIAL/PLATELET
BASOS PCT: 0 %
Basophils Absolute: 0 10*3/uL (ref 0.0–0.1)
EOS ABS: 0.1 10*3/uL (ref 0.0–0.7)
EOS PCT: 1 %
HEMATOCRIT: 37.6 % (ref 36.0–46.0)
Hemoglobin: 12.3 g/dL (ref 12.0–15.0)
Lymphocytes Relative: 35 %
Lymphs Abs: 2.4 10*3/uL (ref 0.7–4.0)
MCH: 28.3 pg (ref 26.0–34.0)
MCHC: 32.7 g/dL (ref 30.0–36.0)
MCV: 86.4 fL (ref 78.0–100.0)
MONO ABS: 0.5 10*3/uL (ref 0.1–1.0)
Monocytes Relative: 8 %
NEUTROS ABS: 3.9 10*3/uL (ref 1.7–7.7)
Neutrophils Relative %: 56 %
PLATELETS: 218 10*3/uL (ref 150–400)
RBC: 4.35 MIL/uL (ref 3.87–5.11)
RDW: 13.6 % (ref 11.5–15.5)
WBC: 6.9 10*3/uL (ref 4.0–10.5)

## 2015-10-09 LAB — BASIC METABOLIC PANEL
Anion gap: 6 (ref 5–15)
BUN: 15 mg/dL (ref 6–20)
CALCIUM: 9.3 mg/dL (ref 8.9–10.3)
CO2: 29 mmol/L (ref 22–32)
CREATININE: 0.78 mg/dL (ref 0.44–1.00)
Chloride: 104 mmol/L (ref 101–111)
GFR calc non Af Amer: >60 mL/min (ref >60)
Glucose, Bld: 80 mg/dL (ref 65–99)
Potassium: 4.4 mmol/L (ref 3.5–5.1)
Sodium: 139 mmol/L (ref 135–145)

## 2015-10-09 LAB — D-DIMER, QUANTITATIVE (NOT AT ARMC): D DIMER QUANT: 0.68 ug{FEU}/mL — AB (ref 0.00–0.48)

## 2015-10-09 LAB — POC URINE PREG, ED: PREG TEST UR: NEGATIVE

## 2015-10-09 LAB — TSH: TSH: 0.016 u[IU]/mL — ABNORMAL LOW (ref 0.350–4.500)

## 2015-10-09 MED ORDER — IOHEXOL 350 MG/ML SOLN
100.0000 mL | Freq: Once | INTRAVENOUS | Status: AC | PRN
  Administered 2015-10-09: 100 mL via INTRAVENOUS

## 2015-10-09 NOTE — ED Provider Notes (Signed)
CSN: [id number]     Arrival date & time 10/09/15  1603 History   First MD Initiated Contact with Patient 10/09/15 1718     Chief Complaint  Patient presents with  . Episodes of tachycardia   . Shortness of Breath  . Possible Pregnancy     (Consider location/radiation/quality/duration/timing/severity/associated sxs/prior Treatment) HPI   Blood pressure 127/88, pulse 89, temperature 97.9 F (36.6 C), temperature source Oral, resp. rate 15, SpO2 99 %.  Sharon Benson is a 39 y.o. female complaining of episodes of heart racing with shortness of breath intermittently over the course of the last week. Patient denies exogenous estrogen, states that she is actively trying to get pregnant, leg swelling, recent trips or mobilizations. She had her thyroid removed in January her last TSH check was 1.5 months ago. Patient denies chest pain, syncope, abdominal pain, fever, chills, cough. On review of systems she notes a charley horse to the right lower extremity over the course of last several days. Patient denies cocaine, methamphetamine use, energy drinks, caffeine intake.  Past Medical History  Diagnosis Date  . Hypothyroidism   . SVD (spontaneous vaginal delivery)     x 1  . Headache(784.0)   . Endometriosis   . Fibroid   . PONV (postoperative nausea and vomiting)     last 2- no vomiting-  Seizures- not with last 2.  . Palpitations   . Seizures ([place])     last seizure 08/11/13, last one 12/14/14-  . Anxiety     panic attack - not recent 2001ish  . GERD (gastroesophageal reflux disease)   . Arthritis    Past Surgical History  Procedure Laterality Date  . Breast reduction surgery    . Ganglion cyst excision  1999    right hand  . Dilation and evacuation N/A 11/25/2013    Procedure: DILATATION AND EVACUATION;  Surgeon: Woodroe Mode, MD;  Location: [facility];  Service: Gynecology;  Laterality: N/A;  . Dilation and curettage of uterus    . Dilation and evacuation N/A 11/30/2013     Procedure: DILATATION AND EVACUATION;  Surgeon: Jonnie Kind, MD;  Location: [facility];  Service: Gynecology;  Laterality: N/A;  . Dilation and evacuation N/A 05/02/2014    Procedure: DILATATION AND EVACUATION;  Surgeon: Ena Dawley, MD;  Location: [facility];  Service: Gynecology;  Laterality: N/A;  . Breast surgery  1997  . Thyroidectomy  01/02/2015  . Thyroidectomy Bilateral 01/02/2015    Procedure: TOTAL THYROIDECTOMY;  Surgeon: Melissa Montane, MD;  Location: [facility];  Service: ENT;  Laterality: Bilateral;   Family History  Problem Relation Age of Onset  . Lung cancer Maternal Grandmother   . Colon cancer Maternal Grandmother   . Cancer Other   . Diabetes Other   . High blood pressure Other   . Diabetes Mother   . Hypertension Father   . Hypertension Paternal Grandmother   . Diabetes Paternal Grandmother   . Heart disease Paternal Grandmother    Social History  Substance Use Topics  . Smoking status: Former Smoker -- 2 years    Types: Cigarettes    Quit date: 04/28/1997  . Smokeless tobacco: Never Used     Comment: smoked in HS  . Alcohol Use: No   OB History    Gravida Para Term Preterm AB TAB SAB Ectopic Multiple Living   4 1 1  2  2   1      Review of Systems  10 systems reviewed and  found to be negative, except as noted in the HPI.   Allergies  Other; Cocoa butter; and Codeine  Home Medications   Prior to Admission medications   Medication Sig Start Date End Date Taking? Authorizing Provider  albuterol (PROVENTIL HFA;VENTOLIN HFA) 108 (90 BASE) MCG/ACT inhaler Inhale 1-2 puffs into the lungs every 6 (six) hours as needed for wheezing or shortness of breath. 07/05/15  Yes Elnora Morrison, MD  Cholecalciferol (VITAMIN D-3) 5000 UNITS TABS Take 10,000 Units by mouth daily.   Yes Historical Provider, MD  folic acid (FOLVITE) 1 MG tablet Take 4 mg by mouth daily.   Yes Historical Provider, MD  ibuprofen (ADVIL,MOTRIN) 800 MG tablet Take 1 tablet (800 mg total) by mouth 3  (three) times daily. Patient taking differently: Take 800 mg by mouth every 8 (eight) hours as needed for mild pain or moderate pain.  12/12/14  Yes Shari Upstill, PA-C  norethindrone (NOR-QD) 0.35 MG tablet Take 1 tablet by mouth daily.   Yes Historical Provider, MD  progesterone (PROMETRIUM) 100 MG capsule Take 1 tablet by mouth daily.   Yes Historical Provider, MD  Thyroid (NATURE-THROID) 97.5 MG TABS Take 1 tablet by mouth 2 (two) times daily. 08/26/15  Yes Historical Provider, MD  cyclobenzaprine (FLEXERIL) 5 MG tablet Take 1 tablet (5 mg total) by mouth 2 (two) times daily as needed for muscle spasms. Patient not taking: Reported on 07/05/2015 02/28/15   Wandra Arthurs, MD  HYDROcodone-acetaminophen (NORCO/VICODIN) 5-325 MG per tablet Take 1 tablet by mouth every 6 (six) hours as needed for moderate pain. Patient not taking: Reported on 07/05/2015 02/28/15   Wandra Arthurs, MD   BP 104/52 mmHg  Pulse 93  Temp(Src) 97.9 F (36.6 C) (Oral)  Resp 17  SpO2 100%  LMP  Physical Exam  Constitutional: She is oriented to person, place, and time. She appears well-developed and well-nourished. No distress.  obese  HENT:  Head: Normocephalic.  Mouth/Throat: Oropharynx is clear and moist.  Eyes: Conjunctivae and EOM are normal.  Neck: Normal range of motion. No JVD present. No tracheal deviation present.  Cardiovascular: Normal rate, regular rhythm and intact distal pulses.   Radial pulse equal bilaterally  Pulmonary/Chest: Effort normal and breath sounds normal. No stridor. No respiratory distress. She has no wheezes. She has no rales. She exhibits no tenderness.  Abdominal: Soft. She exhibits no distension and no mass. There is no tenderness. There is no rebound and no guarding.  Musculoskeletal: Normal range of motion. She exhibits no edema or tenderness.  No calf asymmetry, superficial collaterals, palpable cords, edema, Homans sign negative bilaterally.    Neurological: She is alert and oriented to  person, place, and time.  Skin: Skin is warm. She is not diaphoretic.  Psychiatric: She has a normal mood and affect.  Nursing note and vitals reviewed.   ED Course  Procedures (including critical care time) Labs Review Labs Reviewed  TSH - Abnormal; Notable for the following:    TSH 0.016 (*)    All other components within normal limits  D-DIMER, QUANTITATIVE (NOT AT [facility]) - Abnormal; Notable for the following:    D-Dimer, Quant 0.68 (*)    All other components within normal limits  CBC WITH DIFFERENTIAL/PLATELET  BASIC METABOLIC PANEL  POC URINE PREG, ED    Imaging Review Ct Angio Chest Pe W/cm &/or Wo Cm  10/09/2015  CLINICAL DATA:  Tachycardia, shortness of breath, palpitation, elevated D-dimer EXAM: CT ANGIOGRAPHY CHEST WITH CONTRAST TECHNIQUE: Multidetector CT  imaging of the chest was performed using the standard protocol during bolus administration of intravenous contrast. Multiplanar CT image reconstructions and MIPs were obtained to evaluate the vascular anatomy. CONTRAST:  147mL OMNIPAQUE IOHEXOL 350 MG/ML SOLN COMPARISON:  CTA chest dated 07/05/2015 FINDINGS: Evaluation is mildly constrained by suboptimal bolus timing and respiratory motor. No evidence of pulmonary embolism to the segmental level. Mediastinum/Nodes: The heart is normal in size. No pericardial effusion. No suspicious mediastinal lymphadenopathy. Status post thyroidectomy. Lungs/Pleura: Lungs are clear. No suspicious pulmonary nodules. No focal consolidation. No pleural effusion or pneumothorax. Upper abdomen: Visualized upper abdomen is within normal limits. Musculoskeletal: Visualized osseous structures are within normal limits. Review of the MIP images confirms the above findings. IMPRESSION: No evidence of pulmonary embolism. Negative CT chest. Status post thyroidectomy. Electronically Signed   By: Julian Hy M.D.   On: 10/09/2015 20:00   I have personally reviewed and evaluated these images and lab  results as part of my medical decision-making.   EKG Interpretation None      MDM   Final diagnoses:  Palpitations    Filed Vitals:   10/09/15 1624 10/09/15 1843  BP: 127/88 104/52  Pulse: 89 93  Temp: 97.9 F (36.6 C)   TempSrc: Oral   Resp: 15 17  SpO2: 99% 100%    Medications  iohexol (OMNIPAQUE) 350 MG/ML injection 100 mL (100 mLs Intravenous Contrast Given 10/09/15 1941)    Sharon Benson is 39 y.o. female presenting with palpitations, no syncope. Reports pain to the right lower extremity. She recently had thyroidectomy. Will check a d-dimer, TSH, basic blood work. EKG with no significant abnormalities. D-dimer is elevated however, CT with no signs of pulmonary embolism. Patient will be given cardiology referral. We've had an extensive discussion on avoiding caffeine and pushing fluids.  Evaluation does not show pathology that would require ongoing emergent intervention or inpatient treatment. Pt is hemodynamically stable and mentating appropriately. Discussed findings and plan with patient/guardian, who agrees with care plan. All questions answered. Return precautions discussed and outpatient follow up given.       Monico Blitz, PA-C 10/09/15 2106  Daleen Bo, MD 10/09/15 [phone]

## 2015-10-09 NOTE — Discharge Instructions (Signed)
Push fluids: take small frequent sips of water or Gatorade, do not drink any soda, juice or caffeinated beverages.    Please follow with your primary care doctor in the next 2 days for a check-up. They must obtain records for further management.   Do not hesitate to return to the Emergency Department for any new, worsening or concerning symptoms.     Palpitations A palpitation is the feeling that your heartbeat is irregular or is faster than normal. It may feel like your heart is fluttering or skipping a beat. Palpitations are usually not a serious problem. However, in some cases, you may need further medical evaluation. CAUSES  Palpitations can be caused by:  Smoking.  Caffeine or other stimulants, such as diet pills or energy drinks.  Alcohol.  Stress and anxiety.  Strenuous physical activity.  Fatigue.  Certain medicines.  Heart disease, especially if you have a history of irregular heart rhythms (arrhythmias), such as atrial fibrillation, atrial flutter, or supraventricular tachycardia.  An improperly working pacemaker or defibrillator. DIAGNOSIS  To find the cause of your palpitations, your health care provider will take your medical history and perform a physical exam. Your health care provider may also have you take a test called an ambulatory electrocardiogram (ECG). An ECG records your heartbeat patterns over a 24-hour period. You may also have other tests, such as:  Transthoracic echocardiogram (TTE). During echocardiography, sound waves are used to evaluate how blood flows through your heart.  Transesophageal echocardiogram (TEE).  Cardiac monitoring. This allows your health care provider to monitor your heart rate and rhythm in real time.  Holter monitor. This is a portable device that records your heartbeat and can help diagnose heart arrhythmias. It allows your health care provider to track your heart activity for several days, if needed.  Stress tests by  exercise or by giving medicine that makes the heart beat faster. TREATMENT  Treatment of palpitations depends on the cause of your symptoms and can vary greatly. Most cases of palpitations do not require any treatment other than time, relaxation, and monitoring your symptoms. Other causes, such as atrial fibrillation, atrial flutter, or supraventricular tachycardia, usually require further treatment. HOME CARE INSTRUCTIONS   Avoid:  Caffeinated coffee, tea, soft drinks, diet pills, and energy drinks.  Chocolate.  Alcohol.  Stop smoking if you smoke.  Reduce your stress and anxiety. Things that can help you relax include:  A method of controlling things in your body, such as your heartbeats, with your mind (biofeedback).  Yoga.  Meditation.  Physical activity such as swimming, jogging, or walking.  Get plenty of rest and sleep. SEEK MEDICAL CARE IF:   You continue to have a fast or irregular heartbeat beyond 24 hours.  Your palpitations occur more often. SEEK IMMEDIATE MEDICAL CARE IF:  You have chest pain or shortness of breath.  You have a severe headache.  You feel dizzy or you faint. MAKE SURE YOU:  Understand these instructions.  Will watch your condition.  Will get help right away if you are not doing well or get worse.   This information is not intended to replace advice given to you by your health care provider. Make sure you discuss any questions you have with your health care provider.   Document Released: 11/25/2000 Document Revised: 12/03/2013 Document Reviewed: 01/27/2012 Elsevier Interactive Patient Education [employer].

## 2015-10-09 NOTE — ED Notes (Signed)
Nurse drawing labs. 

## 2015-10-09 NOTE — ED Notes (Signed)
Blood draw done by [facility]

## 2015-10-09 NOTE — ED Notes (Signed)
Pt states began last week with episodes of brief feelings of tachycardia. States earlier this year she had her thyroid removed and they've been trying to regulate her medications, also says whenever she begins these frequent episodes of tachycardia she's usually pregnant. Pt vitals stable in triage, HR 85 NSR. Pt states she does feel "a little SOB." Denies chest pain, N/V/D, fever/chills.

## 2015-10-09 NOTE — ED Notes (Signed)
AVS explained in detail. Knows to follow up with Cardiologist as soon as possible for follow up. No other c/c. Ambulatory with steady gait. A&Ox4. RR even/unlabored.

## 2015-11-09 ENCOUNTER — Ambulatory Visit (INDEPENDENT_AMBULATORY_CARE_PROVIDER_SITE_OTHER): Payer: No Typology Code available for payment source | Admitting: Cardiovascular Disease

## 2015-11-09 ENCOUNTER — Encounter: Payer: Self-pay | Admitting: Cardiovascular Disease

## 2015-11-09 VITALS — BP 130/90 | HR 76 | Ht 64.0 in | Wt 263.8 lb

## 2015-11-09 DIAGNOSIS — R000 Tachycardia, unspecified: Secondary | ICD-10-CM

## 2015-11-09 DIAGNOSIS — E039 Hypothyroidism, unspecified: Secondary | ICD-10-CM | POA: Diagnosis not present

## 2015-11-09 DIAGNOSIS — I493 Ventricular premature depolarization: Secondary | ICD-10-CM | POA: Diagnosis not present

## 2015-11-09 DIAGNOSIS — R002 Palpitations: Secondary | ICD-10-CM | POA: Diagnosis not present

## 2015-11-09 NOTE — Patient Instructions (Signed)

## 2015-11-09 NOTE — Progress Notes (Signed)
Cardiology Office Note   Date:  11/09/2015   ID:  Sharon Benson, DOB 12-21-1975, MRN [id number]  PCP:  Mylinda Latina, MD  Cardiologist:   Thayer Headings, MD   Chief Complaint  Patient presents with  . Palpitations    problem list 1. Palpitations,. 2. Hypothyroidism 3. ashtma    History of Present Illness: Sharon Benson is a 39 y.o. female who presents for evaluation of sporadic episodes of tachycardia  Occurs with rest or exertion ,  Not associated with eating or drinking  Last for a few seconds.   Occurs 2-3 times a day  Wears a fitbit and has measured a HR of 115  On occasion . These are associated with dyspnea.   Was seen in the ER and her TSH was 0.016. Previous T3 and T4 levels have been ok.    Past Medical History  Diagnosis Date  . Hypothyroidism   . SVD (spontaneous vaginal delivery)     x 1  . Headache(784.0)   . Endometriosis   . Fibroid   . PONV (postoperative nausea and vomiting)     last 2- no vomiting-  Seizures- not with last 2.  . Palpitations   . Seizures ([place])     last seizure 08/11/13, last one 12/14/14-  . Anxiety     panic attack - not recent 2001ish  . GERD (gastroesophageal reflux disease)   . Arthritis     Past Surgical History  Procedure Laterality Date  . Breast reduction surgery    . Ganglion cyst excision  1999    right hand  . Dilation and evacuation N/A 11/25/2013    Procedure: DILATATION AND EVACUATION;  Surgeon: Woodroe Mode, MD;  Location: [facility];  Service: Gynecology;  Laterality: N/A;  . Dilation and curettage of uterus    . Dilation and evacuation N/A 11/30/2013    Procedure: DILATATION AND EVACUATION;  Surgeon: Jonnie Kind, MD;  Location: [facility];  Service: Gynecology;  Laterality: N/A;  . Dilation and evacuation N/A 05/02/2014    Procedure: DILATATION AND EVACUATION;  Surgeon: Ena Dawley, MD;  Location: [facility];  Service: Gynecology;  Laterality: N/A;  . Breast surgery  1997  . Thyroidectomy   01/02/2015  . Thyroidectomy Bilateral 01/02/2015    Procedure: TOTAL THYROIDECTOMY;  Surgeon: Melissa Montane, MD;  Location: [facility];  Service: ENT;  Laterality: Bilateral;     Current Outpatient Prescriptions  Medication Sig Dispense Refill  . albuterol (PROVENTIL HFA;VENTOLIN HFA) 108 (90 BASE) MCG/ACT inhaler Inhale 1-2 puffs into the lungs every 6 (six) hours as needed for wheezing or shortness of breath. 1 Inhaler 0  . Cholecalciferol (VITAMIN D-3) 5000 UNITS TABS Take 10,000 Units by mouth daily.    . folic acid (FOLVITE) 1 MG tablet Take 4 mg by mouth daily.    Marland Kitchen ibuprofen (ADVIL,MOTRIN) 800 MG tablet Take 1 tablet (800 mg total) by mouth 3 (three) times daily. (Patient taking differently: Take 800 mg by mouth every 8 (eight) hours as needed for mild pain or moderate pain. ) 21 tablet 0  . norethindrone (NOR-QD) 0.35 MG tablet Take 1 tablet by mouth daily.    . progesterone (PROMETRIUM) 100 MG capsule Take 1 tablet by mouth daily.    . Thyroid (NATURE-THROID) 97.5 MG TABS Take 1 tablet by mouth 2 (two) times daily.     No current facility-administered medications for this visit.    Allergies:   Other; Cocoa butter; and Codeine  Social History:  The patient  reports that she quit smoking about 18 years ago. Her smoking use included Cigarettes. She quit after 2 years of use. She has never used smokeless tobacco. She reports that she does not drink alcohol or use illicit drugs.   Family History:  The patient's family history includes Cancer in her other; Colon cancer in her maternal grandmother; Diabetes in her mother, other, and paternal grandmother; Heart disease in her paternal grandmother; High blood pressure in her other; Hypertension in her father and paternal grandmother; Lung cancer in her maternal grandmother.    ROS:  Please see the history of present illness.    Review of Systems: Constitutional:  denies fever, chills, diaphoresis, appetite change and fatigue.  HEENT: denies  photophobia, eye pain, redness, hearing loss, ear pain, congestion, sore throat, rhinorrhea, sneezing, neck pain, neck stiffness and tinnitus.  Respiratory: denies SOB, DOE, cough, chest tightness, and wheezing.  Cardiovascular: admits to   Palpitations, denies chest pain and leg swelling.  Gastrointestinal: denies nausea, vomiting, abdominal pain, diarrhea, constipation, blood in stool.  Genitourinary: denies dysuria, urgency, frequency, hematuria, flank pain and difficulty urinating.  Musculoskeletal: denies  myalgias, back pain, joint swelling, arthralgias and gait problem.   Skin: denies pallor, rash and wound.  Neurological: denies dizziness, seizures, syncope, weakness, light-headedness, numbness and headaches.   Hematological: denies adenopathy, easy bruising, personal or family bleeding history.  Psychiatric/ Behavioral: denies suicidal ideation, mood changes, confusion, nervousness, sleep disturbance and agitation.       All other systems are reviewed and negative.    PHYSICAL EXAM: VS:  BP 130/90 mmHg  Pulse 76  Ht 5\' 4"  (1.626 m)  Wt 263 lb 12.8 oz (119.659 kg)  BMI 45.26 kg/m2 , BMI Body mass index is 45.26 kg/(m^2). GEN: Well nourished, well developed, in no acute distress HEENT: normal Neck: no JVD, carotid bruits, or masses Cardiac: RRR; no murmurs, rubs, or gallops,no edema  Respiratory:  clear to auscultation bilaterally, normal work of breathing GI: soft, nontender, nondistended, + BS MS: no deformity or atrophy Skin: warm and dry, no rash Neuro:  Strength and sensation are intact Psych: normal   EKG:  EKG is ordered today. The ekg ordered today demonstrates    Recent Labs: 02/28/2015: ALT 21 07/05/2015: B Natriuretic Peptide 35.5 10/09/2015: BUN 15; Creatinine, Ser 0.78; Hemoglobin 12.3; Platelets 218; Potassium 4.4; Sodium 139; TSH 0.016*    Lipid Panel No results found for: CHOL, TRIG, HDL, CHOLHDL, VLDL, LDLCALC, LDLDIRECT    Wt Readings from Last  3 Encounters:  11/09/15 263 lb 12.8 oz (119.659 kg)  01/02/15 277 lb (125.646 kg)  12/23/14 277 lb (125.646 kg)      Other studies Reviewed: Additional studies/ records that were reviewed today include: . Review of the above records demonstrates:    ASSESSMENT AND PLAN:  1.  Palpitations:   Clinically,  It sounds like she's having 2 kinds of palpitations. One episode clearly sounds like sinus tachycardia. These episodes tend to cause her heart rate to be in the 115 range. She also has other palpitations that , clinically sound like premature ventricular contractions.     I have reassured her that both of these types of palpitations are benign and do not cause any long-term cardiac damage.    Her last THS was very low and I suspect that she is a bit over- replaced with her thyroid medication .  She  Has taken one thyroid tablet a day past and was clearly  hypothyroid. She has taken to thyroid tablets a day and I suspect that she's over replaced. I suspect that her correct dose is somewhere between 1 and 2 tabs a day .  Will have her ask Dr. Tye Savoy to adjust her dose.   I will  See her back on an as-needed basis.    Current medicines are reviewed at length with the patient today.  The patient does not have concerns regarding medicines.  The following changes have been made:  no change  Labs/ tests ordered today include:  No orders of the defined types were placed in this encounter.     Disposition:   FU with me as needed.       Eugina Row, Wonda Cheng, MD  11/09/2015 3:49 PM    [address], [place], [place]  [address] Phone: [phone]; Fax: [phone]   [facility]  [address] [place], [place]  [address] [phone]   Fax [phone]

## 2016-12-09 ENCOUNTER — Emergency Department (HOSPITAL_COMMUNITY)
Admission: EM | Admit: 2016-12-09 | Discharge: 2016-12-10 | Disposition: A | Discharge status: Home / Self Care | Payer: Self-pay | Attending: Emergency Medicine | Admitting: Emergency Medicine

## 2016-12-09 ENCOUNTER — Emergency Department (HOSPITAL_COMMUNITY): Payer: Self-pay

## 2016-12-09 ENCOUNTER — Encounter (HOSPITAL_COMMUNITY): Payer: Self-pay | Admitting: Emergency Medicine

## 2016-12-09 DIAGNOSIS — R000 Tachycardia, unspecified: Secondary | ICD-10-CM | POA: Insufficient documentation

## 2016-12-09 DIAGNOSIS — R062 Wheezing: Secondary | ICD-10-CM | POA: Insufficient documentation

## 2016-12-09 DIAGNOSIS — B9789 Other viral agents as the cause of diseases classified elsewhere: Secondary | ICD-10-CM

## 2016-12-09 DIAGNOSIS — R946 Abnormal results of thyroid function studies: Secondary | ICD-10-CM | POA: Insufficient documentation

## 2016-12-09 DIAGNOSIS — R7989 Other specified abnormal findings of blood chemistry: Secondary | ICD-10-CM

## 2016-12-09 DIAGNOSIS — J40 Bronchitis, not specified as acute or chronic: Secondary | ICD-10-CM | POA: Insufficient documentation

## 2016-12-09 DIAGNOSIS — J069 Acute upper respiratory infection, unspecified: Secondary | ICD-10-CM | POA: Insufficient documentation

## 2016-12-09 LAB — BASIC METABOLIC PANEL
ANION GAP: 8 (ref 5–15)
BUN: 10 mg/dL (ref 6–20)
CALCIUM: 9.3 mg/dL (ref 8.9–10.3)
CO2: 29 mmol/L (ref 22–32)
Chloride: 97 mmol/L — ABNORMAL LOW (ref 101–111)
Creatinine, Ser: 0.59 mg/dL (ref 0.44–1.00)
GFR calc Af Amer: >60 mL/min (ref >60)
GLUCOSE: 96 mg/dL (ref 65–99)
Potassium: 3.7 mmol/L (ref 3.5–5.1)
SODIUM: 134 mmol/L — AB (ref 135–145)

## 2016-12-09 LAB — COMPREHENSIVE METABOLIC PANEL
ALT: 21 U/L (ref 14–54)
ANION GAP: 8 (ref 5–15)
AST: 22 U/L (ref 15–41)
Albumin: 3.8 g/dL (ref 3.5–5.0)
Alkaline Phosphatase: 66 U/L (ref 38–126)
BILIRUBIN TOTAL: 0.4 mg/dL (ref 0.3–1.2)
BUN: 12 mg/dL (ref 6–20)
CO2: 27 mmol/L (ref 22–32)
Calcium: 9.1 mg/dL (ref 8.9–10.3)
Chloride: 98 mmol/L — ABNORMAL LOW (ref 101–111)
Creatinine, Ser: 0.72 mg/dL (ref 0.44–1.00)
GFR calc Af Amer: >60 mL/min (ref >60)
Glucose, Bld: 120 mg/dL — ABNORMAL HIGH (ref 65–99)
POTASSIUM: 3.5 mmol/L (ref 3.5–5.1)
Sodium: 133 mmol/L — ABNORMAL LOW (ref 135–145)
TOTAL PROTEIN: 7 g/dL (ref 6.5–8.1)

## 2016-12-09 LAB — CBC
HCT: 36.4 % (ref 36.0–46.0)
HEMOGLOBIN: 12.1 g/dL (ref 12.0–15.0)
MCH: 28.1 pg (ref 26.0–34.0)
MCHC: 33.2 g/dL (ref 30.0–36.0)
MCV: 84.5 fL (ref 78.0–100.0)
Platelets: 219 10*3/uL (ref 150–400)
RBC: 4.31 MIL/uL (ref 3.87–5.11)
RDW: 13 % (ref 11.5–15.5)
WBC: 5 10*3/uL (ref 4.0–10.5)

## 2016-12-09 LAB — URINALYSIS, ROUTINE W REFLEX MICROSCOPIC
BILIRUBIN URINE: NEGATIVE
GLUCOSE, UA: NEGATIVE mg/dL
KETONES UR: NEGATIVE mg/dL
LEUKOCYTES UA: NEGATIVE
NITRITE: NEGATIVE
PH: 9 — AB (ref 5.0–8.0)
Protein, ur: NEGATIVE mg/dL
Specific Gravity, Urine: 1.018 (ref 1.005–1.030)

## 2016-12-09 LAB — I-STAT CG4 LACTIC ACID, ED: LACTIC ACID, VENOUS: 1.52 mmol/L (ref 0.5–1.9)

## 2016-12-09 LAB — I-STAT TROPONIN, ED: Troponin i, poc: 0 ng/mL (ref 0.00–0.08)

## 2016-12-09 LAB — TSH: TSH: <0.01 u[IU]/mL — ABNORMAL LOW (ref 0.350–4.500)

## 2016-12-09 LAB — BRAIN NATRIURETIC PEPTIDE: B Natriuretic Peptide: 31.3 pg/mL (ref 0.0–100.0)

## 2016-12-09 MED ORDER — ALBUTEROL SULFATE (2.5 MG/3ML) 0.083% IN NEBU
5.0000 mg | INHALATION_SOLUTION | Freq: Once | RESPIRATORY_TRACT | Status: AC
  Administered 2016-12-10: 5 mg via RESPIRATORY_TRACT
  Filled 2016-12-09: qty 6

## 2016-12-09 MED ORDER — ACETAMINOPHEN 500 MG PO TABS
1000.0000 mg | ORAL_TABLET | Freq: Once | ORAL | Status: AC
  Administered 2016-12-09: 1000 mg via ORAL
  Filled 2016-12-09: qty 2

## 2016-12-09 MED ORDER — DEXAMETHASONE SODIUM PHOSPHATE 10 MG/ML IJ SOLN
10.0000 mg | Freq: Once | INTRAMUSCULAR | Status: AC
  Administered 2016-12-09: 10 mg via INTRAMUSCULAR
  Filled 2016-12-09: qty 1

## 2016-12-09 MED ORDER — IPRATROPIUM BROMIDE 0.02 % IN SOLN
0.5000 mg | Freq: Once | RESPIRATORY_TRACT | Status: AC
  Administered 2016-12-09: 0.5 mg via RESPIRATORY_TRACT
  Filled 2016-12-09: qty 2.5

## 2016-12-09 MED ORDER — IPRATROPIUM BROMIDE 0.02 % IN SOLN
0.5000 mg | Freq: Once | RESPIRATORY_TRACT | Status: AC
  Administered 2016-12-10: 0.5 mg via RESPIRATORY_TRACT
  Filled 2016-12-09: qty 2.5

## 2016-12-09 MED ORDER — IOPAMIDOL (ISOVUE-370) INJECTION 76%
100.0000 mL | Freq: Once | INTRAVENOUS | Status: AC | PRN
  Administered 2016-12-09: 100 mL via INTRAVENOUS

## 2016-12-09 MED ORDER — ALBUTEROL SULFATE (2.5 MG/3ML) 0.083% IN NEBU
5.0000 mg | INHALATION_SOLUTION | Freq: Once | RESPIRATORY_TRACT | Status: AC
  Administered 2016-12-09: 5 mg via RESPIRATORY_TRACT
  Filled 2016-12-09: qty 6

## 2016-12-09 MED ORDER — IOPAMIDOL (ISOVUE-370) INJECTION 76%
INTRAVENOUS | Status: AC
  Filled 2016-12-09: qty 100

## 2016-12-09 MED ORDER — SODIUM CHLORIDE 0.9 % IV BOLUS (SEPSIS)
1000.0000 mL | Freq: Once | INTRAVENOUS | Status: AC
  Administered 2016-12-09: 1000 mL via INTRAVENOUS

## 2016-12-09 NOTE — ED Notes (Signed)
Per MD patient can eat

## 2016-12-09 NOTE — ED Provider Notes (Signed)
[facility] DEPT Provider Note   CSN: [id number] Arrival date & time: 12/09/16  1617  By signing my name below, I, Ephriam Jenkins, attest that this documentation has been prepared under the direction and in the presence of Jarrett Soho Caison Hearn PA-C  Electronically Signed: Ephriam Jenkins, ED Scribe. 12/09/16. 9:42 PM.   History   Chief Complaint Chief Complaint  Patient presents with  . Cough  . Chest Pain    HPI HPI Comments: Sharon Benson is a 40 y.o. female, with Hx of anxiety, thyroidectomy, who presents to the Emergency Department complaining of constant central chest pain with pressure that started yesterday. Pt started to have a persistent cough yesterday before her chest pain started. Pt is still having a cough and describes her chest pain as, "It feels like something is sitting on my chest". She also reports a subjective fever and chills that started today. She did not take her temperature prior to arrival. She also reports Hx of seizures "when I'm stressed", but does not recall exactly what happens during her seizures. Here in the ED her oral temperature is 100.5 taken in room. Pt is not a smoker.  She has associated wheezing, but denies a hx of asthma.  Pt did not receive a flu shot this year.     The history is provided by the patient and medical records. No language interpreter was used.    Past Medical History:  Diagnosis Date  . Anxiety    panic attack - not recent 2001ish  . Arthritis   . Endometriosis   . Fibroid   . GERD (gastroesophageal reflux disease)   . Headache(784.0)   . Hypothyroidism   . Palpitations   . PONV (postoperative nausea and vomiting)    last 2- no vomiting-  Seizures- not with last 2.  . Seizures ([place])    last seizure 08/11/13, last one 12/14/14-  . SVD (spontaneous vaginal delivery)    x 1    Patient Active Problem List   Diagnosis Date Noted  . Palpitations 11/09/2015  . Thyroid mass 01/02/2015  . GOITER, UNSPECIFIED 07/20/2010  .  MIGRAINE HEADACHE 07/20/2010  . SEIZURE DISORDER, HX OF 07/20/2010  . CHICKENPOX, HX OF 07/20/2010    Past Surgical History:  Procedure Laterality Date  . BREAST REDUCTION SURGERY    . BREAST SURGERY  1997  . DILATION AND CURETTAGE OF UTERUS    . DILATION AND EVACUATION N/A 11/25/2013   Procedure: DILATATION AND EVACUATION;  Surgeon: Woodroe Mode, MD;  Location: [facility];  Service: Gynecology;  Laterality: N/A;  . DILATION AND EVACUATION N/A 11/30/2013   Procedure: DILATATION AND EVACUATION;  Surgeon: Jonnie Kind, MD;  Location: [facility];  Service: Gynecology;  Laterality: N/A;  . DILATION AND EVACUATION N/A 05/02/2014   Procedure: DILATATION AND EVACUATION;  Surgeon: Ena Dawley, MD;  Location: [facility];  Service: Gynecology;  Laterality: N/A;  . GANGLION CYST EXCISION  1999   right hand  . THYROIDECTOMY  01/02/2015  . THYROIDECTOMY Bilateral 01/02/2015   Procedure: TOTAL THYROIDECTOMY;  Surgeon: Melissa Montane, MD;  [facility];  Service: ENT;  Laterality: Bilateral;    OB History    Gravida Para Term Preterm AB Living   4 1 1   2 1    SAB TAB Ectopic Multiple Live Births   2       1       Home Medications    Prior to Admission medications   Medication Sig Start Date  End Date Taking? Authorizing Provider  Cholecalciferol (VITAMIN D-3) 5000 UNITS TABS Take 10,000 Units by mouth every evening.    Yes Historical Provider, MD  folic acid (FOLVITE) 1 MG tablet Take 4 mg by mouth daily.   Yes Historical Provider, MD  Prenatal Vit-Fe Fumarate-FA (MULTIVITAMIN-PRENATAL) 27-0.8 MG TABS tablet Take 1 tablet by mouth every evening.   Yes Historical Provider, MD  progesterone (PROMETRIUM) 100 MG capsule Take 1 tablet by mouth daily.   Yes Historical Provider, MD  Thyroid (NATURE-THROID) 97.5 MG TABS Take 1 tablet by mouth 2 (two) times daily. 08/26/15  Yes Historical Provider, MD  albuterol (PROVENTIL HFA;VENTOLIN HFA) 108 (90 Base) MCG/ACT inhaler Inhale 2 puffs into the lungs every 4  (four) hours as needed for wheezing or shortness of breath. 12/10/16   Nidya Bouyer, PA-C  benzonatate (TESSALON) 100 MG capsule Take 1 capsule (100 mg total) by mouth every 8 (eight) hours. 12/10/16   Makaylynn Bonillas, PA-C  ibuprofen (ADVIL,MOTRIN) 800 MG tablet Take 1 tablet (800 mg total) by mouth 3 (three) times daily. Patient taking differently: Take 800 mg by mouth every 8 (eight) hours as needed for mild pain or moderate pain.  12/12/14   Charlann Lange, PA-C  predniSONE (DELTASONE) 20 MG tablet Take 2 tablets (40 mg total) by mouth daily. 12/10/16   Jarrett Soho Thurmond Hildebran, PA-C  Spacer/Aero-Holding Chambers (AEROCHAMBER PLUS WITH MASK) inhaler Use as instructed 12/10/16   Abigail Butts, PA-C    Family History Family History  Problem Relation Age of Onset  . Lung cancer Maternal Grandmother   . Colon cancer Maternal Grandmother   . Cancer Other   . Diabetes Other   . High blood pressure Other   . Diabetes Mother   . Hypertension Father   . Hypertension Paternal Grandmother   . Diabetes Paternal Grandmother   . Heart disease Paternal Grandmother     Social History Social History  Substance Use Topics  . Smoking status: Former Smoker    Years: 2.00    Types: Cigarettes    Quit date: 04/28/1997  . Smokeless tobacco: Never Used     Comment: smoked in HS  . Alcohol use No     Allergies   Other; Cocoa butter; and Codeine   Review of Systems Review of Systems  Constitutional: Positive for chills and fever.  HENT: Negative for congestion.   Respiratory: Positive for cough and chest tightness.   All other systems reviewed and are negative.    Physical Exam Updated Vital Signs BP 132/91   Pulse 100   Temp 98.9 F (37.2 C) (Oral)   Resp 22   Ht 5\' 4"  (1.626 m)   Wt 263 lb (119.3 kg)   LMP 12/05/2016   SpO2 100%   BMI 45.14 kg/m   Physical Exam  Constitutional: She appears well-developed and well-nourished. No distress.  Awake, alert, nontoxic  appearance  HENT:  Head: Normocephalic and atraumatic.  Mouth/Throat: Oropharynx is clear and moist. No oropharyngeal exudate.  Eyes: Conjunctivae are normal. No scleral icterus.  Neck: Normal range of motion. Neck supple.  Cardiovascular: Regular rhythm and intact distal pulses.  Tachycardia present.   Pulmonary/Chest: Effort normal. Tachypnea noted. No respiratory distress. She has decreased breath sounds. She has wheezes.  Equal chest expansion. Harsh, dry cough.  Abdominal: Soft. Bowel sounds are normal. She exhibits no mass. There is no tenderness. There is no rebound and no guarding.  Musculoskeletal: Normal range of motion. She exhibits no edema.  Neurological: She is  alert.  Speech is clear and goal oriented Moves extremities without ataxia  Skin: Skin is warm and dry. She is not diaphoretic.  Psychiatric: She has a normal mood and affect.  Nursing note and vitals reviewed.    ED Treatments / Results  DIAGNOSTIC STUDIES: Oxygen Saturation is 100% on RA, normal by my interpretation.  COORDINATION OF CARE: 9:31 PM-Discussed treatment plan with pt at bedside and pt agreed to plan.   Labs (all labs ordered are listed, but only abnormal results are displayed) Labs Reviewed  BASIC METABOLIC PANEL - Abnormal; Notable for the following:       Result Value   Sodium 134 (*)    Chloride 97 (*)    All other components within normal limits  COMPREHENSIVE METABOLIC PANEL - Abnormal; Notable for the following:    Sodium 133 (*)    Chloride 98 (*)    Glucose, Bld 120 (*)    All other components within normal limits  URINALYSIS, ROUTINE W REFLEX MICROSCOPIC - Abnormal; Notable for the following:    APPearance HAZY (*)    pH 9.0 (*)    Hgb urine dipstick LARGE (*)    Bacteria, UA RARE (*)    Squamous Epithelial / LPF 0-5 (*)    All other components within normal limits  TSH - Abnormal; Notable for the following:    TSH <0.010 (*)    All other components within normal limits    CULTURE, BLOOD (ROUTINE X 2)  CULTURE, BLOOD (ROUTINE X 2)  URINE CULTURE  CBC  BRAIN NATRIURETIC PEPTIDE  T4, FREE  I-STAT TROPOININ, ED  I-STAT CG4 LACTIC ACID, ED  I-STAT CG4 LACTIC ACID, ED    EKG  EKG Interpretation  Date/Time:  Friday December 09 2016 16:37:31 EST Ventricular Rate:  104 PR Interval:    QRS Duration: 91 QT Interval:  326 QTC Calculation: 429 R Axis:   51 Text Interpretation:  Sinus tachycardia Low voltage, precordial leads RSR' in V1 or V2, right VCD or RVH Borderline T abnormalities, diffuse leads nonspecific precordial TW abnormality new since previous Confirmed by KNOTT MD, DANIEL [phone]) on 12/09/2016 4:44:19 PM       Radiology Dg Chest 2 View  Result Date: 12/09/2016 CLINICAL DATA:  Cough, dyspnea and central chest pain x2 days EXAM: CHEST  2 VIEW COMPARISON:  CXR 07/05/2015, CT 10/09/2015 FINDINGS: The heart size and mediastinal contours are within normal limits. Both lungs are clear. The visualized skeletal structures are unremarkable. IMPRESSION: No active cardiopulmonary disease. Electronically Signed   By: Ashley Royalty M.D.   On: 12/09/2016 17:33    Procedures Procedures (including critical care time)  Medications Ordered in ED Medications  iopamidol (ISOVUE-370) 76 % injection (not administered)  albuterol (PROVENTIL) (2.5 MG/3ML) 0.083% nebulizer solution 5 mg (5 mg Nebulization Given 12/09/16 2144)  ipratropium (ATROVENT) nebulizer solution 0.5 mg (0.5 mg Nebulization Given 12/09/16 2144)  dexamethasone (DECADRON) injection 10 mg (10 mg Intramuscular Given 12/09/16 2147)  acetaminophen (TYLENOL) tablet 1,000 mg (1,000 mg Oral Given 12/09/16 2147)  sodium chloride 0.9 % bolus 1,000 mL (1,000 mLs Intravenous New Bag/Given 12/09/16 2227)  iopamidol (ISOVUE-370) 76 % injection 100 mL (100 mLs Intravenous Contrast Given 12/09/16 2235)  albuterol (PROVENTIL) (2.5 MG/3ML) 0.083% nebulizer solution 5 mg (5 mg Nebulization Given 12/10/16 0004)   ipratropium (ATROVENT) nebulizer solution 0.5 mg (0.5 mg Nebulization Given 12/10/16 0004)     Initial Impression / Assessment and Plan / ED Course  I have reviewed the  triage vital signs and the nursing notes.  Pertinent labs & imaging results that were available during my care of the patient were reviewed by me and considered in my medical decision making (see chart for details).  Clinical Course as of Dec 10 246  Fri Dec 09, 2016  2151 Initial troponin negative Troponin i, poc: 0.00 [HM]  2151 No Leukocytosis WBC: 5.0 [HM]  2151 No evidence of pneumonia DG Chest 2 View [HM]  2151 Sinus tachycardia with some nonspecific T-wave changes EKG 12-Lead [HM]  2151 Patient initially hypertensive but this has resolved spontaneously. Tachycardia persists. Patient with low-grade fever upon reassessment. BP: (!) 151/111 [HM]  2202 Pt meets SIRS criteria, unknown source.    [HM]    Clinical Course User Index [HM] Jarrett Soho Aleesha Ringstad, PA-C   Pt with Cough and shortness of breath.  Wheezing on exam. Normal lactic acid. Normal BNP. Negative troponin. EKG was sinus tachycardia. Low-grade fever. No evidence of pneumonia on chest x-ray. CT scan without evidence of pulmonary embolism.  No leukocytosis.  Patient with recent change to her thyroid medication. TSH very low. Patient is likely hyperthyroid from her medications.  Discussed this with her.  She will hold her medications until her labs can be rechecked. Patient reports she is feeling better.  Tachycardia and tachypnea likely secondary to her hyperthyroid state. Discussed reasons to return to emergency department including fevers, worsening symptoms or other concerns.  The patient was discussed with and seen by Dr. Laneta Simmers who agrees with the treatment plan.   Final Clinical Impressions(s) / ED Diagnoses   Final diagnoses:  Bronchitis  Abnormal TSH  Tachycardia  Viral URI with cough    New Prescriptions New Prescriptions   ALBUTEROL  (PROVENTIL HFA;VENTOLIN HFA) 108 (90 BASE) MCG/ACT INHALER    Inhale 2 puffs into the lungs every 4 (four) hours as needed for wheezing or shortness of breath.   BENZONATATE (TESSALON) 100 MG CAPSULE    Take 1 capsule (100 mg total) by mouth every 8 (eight) hours.   PREDNISONE (DELTASONE) 20 MG TABLET    Take 2 tablets (40 mg total) by mouth daily.   SPACER/AERO-HOLDING CHAMBERS (AEROCHAMBER PLUS WITH MASK) INHALER    Use as instructed    I personally performed the services described in this documentation, which was scribed in my presence. The recorded information has been reviewed and is accurate.     Jarrett Soho Travas Schexnayder, PA-C 12/10/16 [id number]    Leo Grosser, MD 12/10/16 [phone]

## 2016-12-09 NOTE — ED Triage Notes (Signed)
Pt complaint of constant central chest heaviness "like something sitting on it" worse with cough for 2 days. Pt continues to report dry cough for 2 days. Pt denies other.

## 2016-12-09 NOTE — ED Notes (Signed)
MD obtained patient temperature by mouth.  Reading 100.5

## 2016-12-09 NOTE — ED Notes (Signed)
Patient transported to CT 

## 2016-12-10 LAB — T4, FREE: FREE T4: 0.78 ng/dL (ref 0.61–1.12)

## 2016-12-10 LAB — I-STAT CG4 LACTIC ACID, ED: Lactic Acid, Venous: 1.39 mmol/L (ref 0.5–1.9)

## 2016-12-10 MED ORDER — PREDNISONE 20 MG PO TABS: 40.0000 mg | ORAL_TABLET | Freq: Every day | ORAL | 0 refills | Status: DC

## 2016-12-10 MED ORDER — ALBUTEROL SULFATE HFA 108 (90 BASE) MCG/ACT IN AERS: 2.0000 | INHALATION_SPRAY | RESPIRATORY_TRACT | 3 refills | Status: DC | PRN

## 2016-12-10 MED ORDER — BENZONATATE 100 MG PO CAPS: 100.0000 mg | ORAL_CAPSULE | Freq: Three times a day (TID) | ORAL | 0 refills | Status: DC

## 2016-12-10 MED ORDER — AEROCHAMBER PLUS W/MASK MISC: 2 refills | Status: DC

## 2016-12-10 NOTE — Discharge Instructions (Addendum)
1. Medications: Albuterol, tessalon, STOP your NATURAL-THRIOD medication usual home medications 2. Treatment: rest, drink plenty of fluids, take tylenol or ibuprofen for fever control;  3. Follow Up: Please followup with your primary doctor in 1-2 days for discussion of your diagnoses and repeat lab work and further evaluation after today's visit; if you do not have a primary care doctor use the resource guide provided to find one; Return to the ER for high fevers, difficulty breathing or other concerning symptoms

## 2016-12-11 ENCOUNTER — Emergency Department (HOSPITAL_COMMUNITY)
Admission: EM | Admit: 2016-12-11 | Discharge: 2016-12-11 | Disposition: A | Discharge status: Home / Self Care | Payer: No Typology Code available for payment source | Attending: Emergency Medicine | Admitting: Emergency Medicine

## 2016-12-11 ENCOUNTER — Emergency Department (HOSPITAL_COMMUNITY): Payer: No Typology Code available for payment source

## 2016-12-11 ENCOUNTER — Encounter (HOSPITAL_COMMUNITY): Payer: Self-pay | Admitting: Emergency Medicine

## 2016-12-11 DIAGNOSIS — Z79899 Other long term (current) drug therapy: Secondary | ICD-10-CM | POA: Insufficient documentation

## 2016-12-11 DIAGNOSIS — Z87891 Personal history of nicotine dependence: Secondary | ICD-10-CM | POA: Insufficient documentation

## 2016-12-11 DIAGNOSIS — E039 Hypothyroidism, unspecified: Secondary | ICD-10-CM | POA: Insufficient documentation

## 2016-12-11 DIAGNOSIS — J129 Viral pneumonia, unspecified: Secondary | ICD-10-CM

## 2016-12-11 LAB — URINE CULTURE

## 2016-12-11 MED ORDER — IBUPROFEN 800 MG PO TABS: 800.0000 mg | ORAL_TABLET | Freq: Three times a day (TID) | ORAL | 0 refills | Status: DC | PRN

## 2016-12-11 MED ORDER — IPRATROPIUM-ALBUTEROL 0.5-2.5 (3) MG/3ML IN SOLN
3.0000 mL | Freq: Once | RESPIRATORY_TRACT | Status: AC
  Administered 2016-12-11: 3 mL via RESPIRATORY_TRACT
  Filled 2016-12-11: qty 3

## 2016-12-11 MED ORDER — ALBUTEROL SULFATE (2.5 MG/3ML) 0.083% IN NEBU
2.5000 mg | INHALATION_SOLUTION | Freq: Once | RESPIRATORY_TRACT | Status: AC
  Administered 2016-12-11: 2.5 mg via RESPIRATORY_TRACT
  Filled 2016-12-11: qty 3

## 2016-12-11 MED ORDER — ALBUTEROL SULFATE HFA 108 (90 BASE) MCG/ACT IN AERS
2.0000 | INHALATION_SPRAY | Freq: Once | RESPIRATORY_TRACT | Status: DC
  Filled 2016-12-11: qty 6.7

## 2016-12-11 MED ORDER — DEXAMETHASONE SODIUM PHOSPHATE 10 MG/ML IJ SOLN
10.0000 mg | Freq: Once | INTRAMUSCULAR | Status: AC
  Administered 2016-12-11: 10 mg via INTRAMUSCULAR
  Filled 2016-12-11: qty 1

## 2016-12-11 MED ORDER — AEROCHAMBER PLUS W/MASK MISC
1.0000 | Freq: Once | Status: DC
  Filled 2016-12-11: qty 1

## 2016-12-11 MED ORDER — KETOROLAC TROMETHAMINE 60 MG/2ML IM SOLN
60.0000 mg | Freq: Once | INTRAMUSCULAR | Status: AC
  Administered 2016-12-11: 60 mg via INTRAMUSCULAR
  Filled 2016-12-11: qty 2

## 2016-12-11 MED ORDER — ALBUTEROL SULFATE (2.5 MG/3ML) 0.083% IN NEBU
5.0000 mg | INHALATION_SOLUTION | Freq: Once | RESPIRATORY_TRACT | Status: AC
  Administered 2016-12-11: 5 mg via RESPIRATORY_TRACT
  Filled 2016-12-11: qty 6

## 2016-12-11 MED ORDER — GUAIFENESIN-DM 100-10 MG/5ML PO SYRP: 5.0000 mL | ORAL_SOLUTION | ORAL | 0 refills | Status: DC | PRN

## 2016-12-11 NOTE — Discharge Instructions (Signed)
Read the information below.  Use the prescribed medication as directed.  Please discuss all new medications with your pharmacist.  You may return to the Emergency Department at any time for worsening condition or any new symptoms that concern you.   If you develop worsening chest pain, shortness of breath, fever, you pass out, or become weak or dizzy, return to the ER for a recheck.    °

## 2016-12-11 NOTE — ED Triage Notes (Signed)
Pt c/o SOB, wheeze, hoarseness, chest congestion, unproductive cough, constant chest pressure worse when laying down. In ED for same on Friday, symptoms have worsened. New onset clear rhinorrhea and sneezing.

## 2016-12-11 NOTE — ED Notes (Signed)
O2 99% while ambulating

## 2016-12-11 NOTE — Progress Notes (Signed)
Peak flow done best of 3 is 80. Pt has weak effort.

## 2016-12-11 NOTE — ED Provider Notes (Signed)
[facility] DEPT Provider Note   CSN: [id number] Arrival date & time: 12/11/16  1354  By signing my name below, I, Sharon Benson, attest that this documentation has been prepared under the direction and in the presence of  [facility], PA-C. Electronically Signed: Delton Benson, ED Scribe. 12/11/16. 2:36 PM.   History   Chief Complaint Chief Complaint  Patient presents with  . Cough   The history is provided by the patient. No language interpreter was used.   HPI Comments:  Sharon Benson is a 40 y.o. female who presents to the Emergency Department complaining of unchanged, persistent dry cough x several days. Pt also reports chest tightness, wheezing, SOB, hoarseness, resolved bilateral arm swelling (present at last visit), resolved ear pain and new rhinorrhea. Her pain is worse with breathing. Pt notes her body temperature dropped to 94 yesterday. She has taken her prednisone, albuterol inhaler and tessalon perles with no relief. Pt denies fevers, sore throat, congestion, current ear pain, having a nebulizer at home, hx of asthma, any other associated symptoms and any other modifying factors at this time. Pt is a non-smoker.   Denies any recent travel.   Pt was seen in ED on 12/09/16 complaining of chest pain with cough and fever. CT scan of chest was negative for pulmonary embolism, negative chest X-Ray, EKG showed sinus tachycardia,  negative troponin and BNP. Normal lactic acid and TSH very low. Pt was advised to hold her medications and get rechecked PCP. Feel pt's symptoms improved with nebs, steroids and fluids. She was discharged with with albuterol, prednisone and tessalon perles.    Past Medical History:  Diagnosis Date  . Anxiety    panic attack - not recent 2001ish  . Arthritis   . Endometriosis   . Fibroid   . GERD (gastroesophageal reflux disease)   . Headache(784.0)   . Hypothyroidism   . Palpitations   . PONV (postoperative nausea and vomiting)    last 2- no  vomiting-  Seizures- not with last 2.  . Seizures ([place])    last seizure 08/11/13, last one 12/14/14-  . SVD (spontaneous vaginal delivery)    x 1    Patient Active Problem List   Diagnosis Date Noted  . Palpitations 11/09/2015  . Thyroid mass 01/02/2015  . GOITER, UNSPECIFIED 07/20/2010  . MIGRAINE HEADACHE 07/20/2010  . SEIZURE DISORDER, HX OF 07/20/2010  . CHICKENPOX, HX OF 07/20/2010    Past Surgical History:  Procedure Laterality Date  . BREAST REDUCTION SURGERY    . BREAST SURGERY  1997  . DILATION AND CURETTAGE OF UTERUS    . DILATION AND EVACUATION N/A 11/25/2013   Procedure: DILATATION AND EVACUATION;  Surgeon: Woodroe Mode, MD;  Location: [facility];  Service: Gynecology;  Laterality: N/A;  . DILATION AND EVACUATION N/A 11/30/2013   Procedure: DILATATION AND EVACUATION;  Surgeon: Jonnie Kind, MD;  Location: [facility];  Service: Gynecology;  Laterality: N/A;  . DILATION AND EVACUATION N/A 05/02/2014   Procedure: DILATATION AND EVACUATION;  Surgeon: Ena Dawley, MD;  Location: [facility];  Service: Gynecology;  Laterality: N/A;  . GANGLION CYST EXCISION  1999   right hand  . THYROIDECTOMY  01/02/2015  . THYROIDECTOMY Bilateral 01/02/2015   Procedure: TOTAL THYROIDECTOMY;  Surgeon: Melissa Montane, MD;  Location: [facility];  Service: ENT;  Laterality: Bilateral;    OB History    Gravida Para Term Preterm AB Living   4 1 1   2 1    SAB  TAB Ectopic Multiple Live Births   2       1       Home Medications    Prior to Admission medications   Medication Sig Start Date End Date Taking? Authorizing Provider  albuterol (PROVENTIL HFA;VENTOLIN HFA) 108 (90 Base) MCG/ACT inhaler Inhale 2 puffs into the lungs every 4 (four) hours as needed for wheezing or shortness of breath. 12/10/16   Hannah Muthersbaugh, PA-C  benzonatate (TESSALON) 100 MG capsule Take 1 capsule (100 mg total) by mouth every 8 (eight) hours. 12/10/16   Hannah Muthersbaugh, PA-C  Cholecalciferol (VITAMIN D-3) 5000 UNITS  TABS Take 10,000 Units by mouth every evening.     Historical Provider, MD  folic acid (FOLVITE) 1 MG tablet Take 4 mg by mouth daily.    Historical Provider, MD  guaiFENesin-dextromethorphan (ROBITUSSIN DM) 100-10 MG/5ML syrup Take 5 mLs by mouth every 4 (four) hours as needed for cough (and congestion). 12/11/16   Clayton Bibles, PA-C  ibuprofen (ADVIL,MOTRIN) 800 MG tablet Take 1 tablet (800 mg total) by mouth every 8 (eight) hours as needed for mild pain or moderate pain. 12/11/16   Clayton Bibles, PA-C  predniSONE (DELTASONE) 20 MG tablet Take 2 tablets (40 mg total) by mouth daily. 12/10/16   Hannah Muthersbaugh, PA-C  Prenatal Vit-Fe Fumarate-FA (MULTIVITAMIN-PRENATAL) 27-0.8 MG TABS tablet Take 1 tablet by mouth every evening.    Historical Provider, MD  progesterone (PROMETRIUM) 100 MG capsule Take 1 tablet by mouth daily.    Historical Provider, MD  Spacer/Aero-Holding Chambers (AEROCHAMBER PLUS WITH MASK) inhaler Use as instructed 12/10/16   Jarrett Soho Muthersbaugh, PA-C  Thyroid (NATURE-THROID) 97.5 MG TABS Take 1 tablet by mouth 2 (two) times daily. 08/26/15   Historical Provider, MD    Family History Family History  Problem Relation Age of Onset  . Lung cancer Maternal Grandmother   . Colon cancer Maternal Grandmother   . Cancer Other   . Diabetes Other   . High blood pressure Other   . Diabetes Mother   . Hypertension Father   . Hypertension Paternal Grandmother   . Diabetes Paternal Grandmother   . Heart disease Paternal Grandmother     Social History Social History  Substance Use Topics  . Smoking status: Former Smoker    Years: 2.00    Types: Cigarettes    Quit date: 04/28/1997  . Smokeless tobacco: Never Used     Comment: smoked in HS  . Alcohol use No     Allergies   Other; Cocoa butter; and Codeine   Review of Systems Review of Systems  Constitutional: Negative for fever.  HENT: Positive for ear pain, rhinorrhea and voice change. Negative for congestion.     Respiratory: Positive for cough, chest tightness, shortness of breath and wheezing.   Cardiovascular: Negative for leg swelling.    Physical Exam Updated Vital Signs BP 141/68 (BP Location: Right Arm)   Pulse 75   Temp 98.1 F (36.7 C) (Oral)   Resp 18   LMP 12/05/2016   SpO2 97%   Physical Exam  Constitutional: She appears well-developed and well-nourished. No distress.  HENT:  Head: Normocephalic and atraumatic.  Mouth/Throat: Posterior oropharyngeal erythema present. No oropharyngeal exudate, posterior oropharyngeal edema or tonsillar abscesses.  Nasal mucosa erythematous with some discharge.  Neck: Neck supple.  Cardiovascular: Normal rate and regular rhythm.   Pulmonary/Chest: Effort normal. Tachypnea noted. No respiratory distress. She has decreased breath sounds. She has no wheezes. She has no rhonchi. She has  no rales.  Abdominal: Soft. She exhibits no distension. There is no tenderness. There is no rebound and no guarding.  Neurological: She is alert.  Skin: She is not diaphoretic.  Nursing note and vitals reviewed.  ED Treatments / Results  DIAGNOSTIC STUDIES:  Oxygen Saturation is 99% on RA, normal by my interpretation.    COORDINATION OF CARE:  2:19 PM Discussed treatment plan with pt at bedside and pt agreed to plan.  Labs (all labs ordered are listed, but only abnormal results are displayed) Labs Reviewed - No data to display  EKG  EKG Interpretation None       Radiology Dg Chest 2 View  Result Date: 12/11/2016 CLINICAL DATA:  Shortness of breath, wheezing, chest tightness EXAM: CHEST  2 VIEW COMPARISON:  12/09/2016 FINDINGS: Heart and mediastinal contours are within normal limits. No focal opacities or effusions. No acute bony abnormality. IMPRESSION: No active cardiopulmonary disease. Electronically Signed   By: Rolm Baptise M.D.   On: 12/11/2016 14:41   Ct Angio Chest Pe W And/or Wo Contrast  Result Date: 12/09/2016 CLINICAL DATA:  Central  chest pain.  Sudden onset cough. EXAM: CT ANGIOGRAPHY CHEST WITH CONTRAST TECHNIQUE: Multidetector CT imaging of the chest was performed using the standard protocol during bolus administration of intravenous contrast. Multiplanar CT image reconstructions and MIPs were obtained to evaluate the vascular anatomy. CONTRAST:  100 cc Isovue 370 IV COMPARISON:  Radiographs earlier this day.  Chest CT 10/09/2015 FINDINGS: Cardiovascular: No pulmonary arterial filling defects to the proximal segmental level, distal segmental and subsegmental branches are not assessed due to contrast bolus timing. Thoracic aorta is normal in caliber are without dissection. Common origin of the left common carotid and brachiocephalic artery, normal variant. The heart is normal in size. No pericardial effusion. Mediastinum/Nodes: No mediastinal, hilar, or axillary adenopathy. Surgical clips post thyroidectomy. The esophagus is decompressed. Lungs/Pleura: No focal airspace disease. Lower lobe bronchial thickening. Scattered atelectasis. Detailed evaluation limited by breathing motion artifact. No pleural fluid. Upper Abdomen: No acute abnormality. Musculoskeletal: There are no acute or suspicious osseous abnormalities. Review of the MIP images confirms the above findings. IMPRESSION: 1. No central pulmonary embolus. 2. Lower lobe bronchial thickening which may be infectious or inflammatory Electronically Signed   By: Jeb Levering M.D.   On: 12/09/2016 23:24    Procedures Procedures (including critical care time)  Medications Ordered in ED Medications  albuterol (PROVENTIL HFA;VENTOLIN HFA) 108 (90 Base) MCG/ACT inhaler 2 puff (not administered)  aerochamber plus with mask device 1 each (not administered)  dexamethasone (DECADRON) injection 10 mg (10 mg Intramuscular Given 12/11/16 1437)  ketorolac (TORADOL) injection 60 mg (60 mg Intramuscular Given 12/11/16 1436)  ipratropium-albuterol (DUONEB) 0.5-2.5 (3) MG/3ML nebulizer  solution 3 mL (3 mLs Nebulization Given 12/11/16 1437)  albuterol (PROVENTIL) (2.5 MG/3ML) 0.083% nebulizer solution 2.5 mg (2.5 mg Nebulization Given 12/11/16 1437)  ipratropium-albuterol (DUONEB) 0.5-2.5 (3) MG/3ML nebulizer solution 3 mL (3 mLs Nebulization Given 12/11/16 1544)  albuterol (PROVENTIL) (2.5 MG/3ML) 0.083% nebulizer solution 5 mg (5 mg Nebulization Given 12/11/16 1725)     Initial Impression / Assessment and Plan / ED Course  I have reviewed the triage vital signs and the nursing notes.  Pertinent labs & imaging results that were available during my care of the patient were reviewed by me and considered in my medical decision making (see chart for details).  Clinical Course       Afebrile, nontoxic patient with chest tightness, SOB.  Seen in ED  two days ago with workup negative for PE, PNA, ACS.  Returning for continued symptoms.  Improves with nebs but only temporarily.  Pt also seen and examined by Dr Johnney Killian who recommends d/c with Robitussin DM, albuterol with spacer, motrin for pain, PCP follow up.   D/C home.  Discussed result, findings, treatment, and follow up  with patient.  Pt given return precautions.  Pt verbalizes understanding and agrees with plan.       Final Clinical Impressions(s) / ED Diagnoses   Final diagnoses:  Viral pneumonia    New Prescriptions Discharge Medication List as of 12/11/2016  6:47 PM    START taking these medications   Details  guaiFENesin-dextromethorphan (ROBITUSSIN DM) 100-10 MG/5ML syrup Take 5 mLs by mouth every 4 (four) hours as needed for cough (and congestion)., Starting Sun 12/11/2016, Print        I personally performed the services described in this documentation, which was scribed in my presence. The recorded information has been reviewed and is accurate.     Clayton Bibles, PA-C 12/11/16 2019    Charlesetta Shanks, MD 12/14/16 [phone]

## 2016-12-15 LAB — CULTURE, BLOOD (ROUTINE X 2)
Culture: NO GROWTH
Culture: NO GROWTH

## 2017-05-22 ENCOUNTER — Encounter: Payer: Self-pay | Admitting: *Deleted

## 2017-05-23 LAB — OB RESULTS CONSOLE HIV ANTIBODY (ROUTINE TESTING): HIV: NONREACTIVE

## 2017-05-23 LAB — OB RESULTS CONSOLE GC/CHLAMYDIA
CHLAMYDIA, DNA PROBE: NEGATIVE
GC PROBE AMP, GENITAL: NEGATIVE

## 2017-05-23 LAB — OB RESULTS CONSOLE HEPATITIS B SURFACE ANTIGEN: Hepatitis B Surface Ag: NEGATIVE

## 2017-05-23 LAB — OB RESULTS CONSOLE ABO/RH: RH Type: POSITIVE

## 2017-05-23 LAB — OB RESULTS CONSOLE RUBELLA ANTIBODY, IGM: RUBELLA: IMMUNE

## 2017-05-23 LAB — OB RESULTS CONSOLE ANTIBODY SCREEN: ANTIBODY SCREEN: NEGATIVE

## 2017-05-23 LAB — OB RESULTS CONSOLE RPR: RPR: NONREACTIVE

## 2017-06-03 ENCOUNTER — Inpatient Hospital Stay (HOSPITAL_COMMUNITY)
Admission: AD | Admit: 2017-06-03 | Discharge: 2017-06-03 | Disposition: A | Discharge status: Home / Self Care | Payer: Medicaid Other | Source: Ambulatory Visit | Attending: Obstetrics and Gynecology | Admitting: Obstetrics and Gynecology

## 2017-06-03 ENCOUNTER — Encounter (HOSPITAL_COMMUNITY): Payer: Self-pay | Admitting: *Deleted

## 2017-06-03 ENCOUNTER — Inpatient Hospital Stay (HOSPITAL_COMMUNITY): Payer: Medicaid Other

## 2017-06-03 DIAGNOSIS — O2621 Pregnancy care for patient with recurrent pregnancy loss, first trimester: Secondary | ICD-10-CM | POA: Diagnosis not present

## 2017-06-03 DIAGNOSIS — O3411 Maternal care for benign tumor of corpus uteri, first trimester: Secondary | ICD-10-CM | POA: Diagnosis not present

## 2017-06-03 DIAGNOSIS — O09529 Supervision of elderly multigravida, unspecified trimester: Secondary | ICD-10-CM

## 2017-06-03 DIAGNOSIS — D259 Leiomyoma of uterus, unspecified: Secondary | ICD-10-CM | POA: Insufficient documentation

## 2017-06-03 DIAGNOSIS — R102 Pelvic and perineal pain: Secondary | ICD-10-CM | POA: Insufficient documentation

## 2017-06-03 DIAGNOSIS — D219 Benign neoplasm of connective and other soft tissue, unspecified: Secondary | ICD-10-CM | POA: Diagnosis present

## 2017-06-03 DIAGNOSIS — N96 Recurrent pregnancy loss: Secondary | ICD-10-CM | POA: Diagnosis present

## 2017-06-03 DIAGNOSIS — O26899 Other specified pregnancy related conditions, unspecified trimester: Secondary | ICD-10-CM

## 2017-06-03 DIAGNOSIS — Z885 Allergy status to narcotic agent status: Secondary | ICD-10-CM | POA: Insufficient documentation

## 2017-06-03 DIAGNOSIS — O26891 Other specified pregnancy related conditions, first trimester: Secondary | ICD-10-CM | POA: Insufficient documentation

## 2017-06-03 DIAGNOSIS — Z87891 Personal history of nicotine dependence: Secondary | ICD-10-CM | POA: Diagnosis not present

## 2017-06-03 DIAGNOSIS — Z6841 Body Mass Index (BMI) 40.0 and over, adult: Secondary | ICD-10-CM | POA: Insufficient documentation

## 2017-06-03 DIAGNOSIS — Z3A08 8 weeks gestation of pregnancy: Secondary | ICD-10-CM | POA: Insufficient documentation

## 2017-06-03 DIAGNOSIS — O09521 Supervision of elderly multigravida, first trimester: Secondary | ICD-10-CM | POA: Insufficient documentation

## 2017-06-03 DIAGNOSIS — O99211 Obesity complicating pregnancy, first trimester: Secondary | ICD-10-CM | POA: Insufficient documentation

## 2017-06-03 LAB — WET PREP, GENITAL
CLUE CELLS WET PREP: NONE SEEN
Sperm: NONE SEEN
Trich, Wet Prep: NONE SEEN
Yeast Wet Prep HPF POC: NONE SEEN

## 2017-06-03 LAB — URINALYSIS, ROUTINE W REFLEX MICROSCOPIC
Bilirubin Urine: NEGATIVE
GLUCOSE, UA: NEGATIVE mg/dL
Hgb urine dipstick: NEGATIVE
Ketones, ur: NEGATIVE mg/dL
LEUKOCYTES UA: NEGATIVE
Nitrite: NEGATIVE
PH: 6 (ref 5.0–8.0)
Protein, ur: NEGATIVE mg/dL
Specific Gravity, Urine: 1.021 (ref 1.005–1.030)

## 2017-06-03 LAB — POCT PREGNANCY, URINE: PREG TEST UR: POSITIVE — AB

## 2017-06-03 NOTE — MAU Note (Signed)
Sudden onset of abd pain.  Thought it was gas at first, has gotten worse.  Like severe menstrual cramps.  Has had preg confirmed. Denies bleeding

## 2017-06-03 NOTE — Discharge Instructions (Signed)
Abdominal Pain During Pregnancy Belly (abdominal) pain is common during pregnancy. Most of the time, it is not a serious problem. Other times, it can be a sign that something is wrong with the pregnancy. Always tell your doctor if you have belly pain. Follow these instructions at home: Monitor your belly pain for any changes. The following actions may help you feel better:  Do not have sex (intercourse) or put anything in your vagina until you feel better.  Rest until your pain stops.  Drink clear fluids if you feel sick to your stomach (nauseous). Do not eat solid food until you feel better.  Only take medicine as told by your doctor.  Keep all doctor visits as told.  Get help right away if:  You are bleeding, leaking fluid, or pieces of tissue come out of your vagina.  You have more pain or cramping.  You keep throwing up (vomiting).  You have pain when you pee (urinate) or have blood in your pee.  You have a fever.  You feel very weak or feel like passing out.  You have trouble breathing, with or without belly pain.  You have a very bad headache and belly pain.  You have fluid leaking from your vagina and belly pain.  You keep having watery poop (diarrhea).  Your belly pain does not go away after resting, or the pain gets worse. This information is not intended to replace advice given to you by your health care provider. Make sure you discuss any questions you have with your health care provider. Document Released: 11/16/2009 Document Revised: 07/06/2016 Document Reviewed: 06/27/2013 Elsevier Interactive Patient Education  [employer].

## 2017-06-03 NOTE — MAU Provider Note (Signed)
History   41 yo [id number] at 8 5/7 weeks by sure LMP presented unannounced c/o sharp suprapubic pain x 1 hour.  Patient very concerned, since other miscarriages "have started the same way".  Scheduled for NOB w/u and [name] with me on 06/15/17.  Has done UPTs at home.  GC/chlamydia negative from 05/24/17.  Known fibroids x 3 from pelvic US 01/24/17 (posterior fundal serosal, anterior left LUS intramural, anterior mid intramural), largest 2.1 cm.  Patient Active Problem List   Diagnosis Date Noted  . Fibroids 06/03/2017  . AMA (advanced maternal age) multigravida 35+ 06/03/2017  . 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 Morbid obesity with BMI of 45.0-49.9, adult ([place]) 06/03/2017  . Palpitations 11/09/2015  . Thyroid mass 01/02/2015  . GOITER, UNSPECIFIED 07/20/2010  . MIGRAINE HEADACHE 07/20/2010  . SEIZURE DISORDER, HX OF 07/20/2010    Chief Complaint  Patient presents with  . Abdominal Pain   HPI:  See above  OB History    Gravida Para Term Preterm AB Living   5 1 1   2 1    SAB TAB Ectopic Multiple Live Births   2       1      Past Medical History:  Diagnosis Date  . Anxiety    panic attack - not recent 2001ish  . Arthritis   . Endometriosis   . Fibroid   . GERD (gastroesophageal reflux disease)   . Headache(784.0)   . Hypothyroidism   . Palpitations   . PONV (postoperative nausea and vomiting)    last 2- no vomiting-  Seizures- not with last 2.  . Seizures ([place])    last seizure 08/11/13, last one 12/14/14-  . SVD (spontaneous vaginal delivery)    x 1  Had cardiac evaluation 01/2017 for palpitations, negative findings, likely overcorrected with thyroid meds. Hx total thyroidectomy 12/2014 due to bilateral thyroid nodules. Reports hx of seizures, but has never been officially dx, never shows up on CT, has not seen a neurologist--reports last one 12[place]2014 in church.  Past Surgical History:  Procedure Laterality Date  . BREAST REDUCTION SURGERY    . BREAST SURGERY   1997  . DILATION AND CURETTAGE OF UTERUS    . DILATION AND EVACUATION N/A 11/25/2013   Procedure: DILATATION AND EVACUATION;  Surgeon: Woodroe Mode, MD;  Location: [facility];  Service: Gynecology;  Laterality: N/A;  . DILATION AND EVACUATION N/A 11/30/2013   Procedure: DILATATION AND EVACUATION;  Surgeon: Jonnie Kind, MD;  Location: [facility];  Service: Gynecology;  Laterality: N/A;  . DILATION AND EVACUATION N/A 05/02/2014   Procedure: DILATATION AND EVACUATION;  Surgeon: Ena Dawley, MD;  Location: [facility];  Service: Gynecology;  Laterality: N/A;  . GANGLION CYST EXCISION  1999   right hand  . THYROIDECTOMY  01/02/2015  . THYROIDECTOMY Bilateral 01/02/2015   Procedure: TOTAL THYROIDECTOMY;  Surgeon: Melissa Montane, MD;  Location: [facility];  Service: ENT;  Laterality: Bilateral;    Family History  Problem Relation Age of Onset  . Lung cancer Maternal Grandmother   . Colon cancer Maternal Grandmother   . Cancer Other   . Diabetes Other   . High blood pressure Other   . Diabetes Mother   . Hypertension Father   . Hypertension Paternal Grandmother   . Diabetes Paternal Grandmother   . Heart disease Paternal Grandmother     Social History  Substance Use Topics  . Smoking status: Former Smoker    Years: 2.00  Types: Cigarettes    Quit date: 04/28/1997  . Smokeless tobacco: Never Used     Comment: smoked in HS  . Alcohol use No    Allergies:  Allergies  Allergen Reactions  . Other Anaphylaxis and Itching    Cantaloupe  Pt has Vertigo  . Cocoa Butter Hives  . Codeine Other (See Comments)    seizures    Prescriptions Prior to Admission  Medication Sig Dispense Refill Last Dose  . albuterol (PROVENTIL HFA;VENTOLIN HFA) 108 (90 Base) MCG/ACT inhaler Inhale 2 puffs into the lungs every 4 (four) hours as needed for wheezing or shortness of breath. 1 Inhaler 3   . benzonatate (TESSALON) 100 MG capsule Take 1 capsule (100 mg total) by mouth every 8 (eight) hours. 21 capsule 0    . Cholecalciferol (VITAMIN D-3) 5000 UNITS TABS Take 10,000 Units by mouth every evening.    12/08/2016 at Unknown time  . folic acid (FOLVITE) 1 MG tablet Take 4 mg by mouth daily.   12/08/2016 at Unknown time  . guaiFENesin-dextromethorphan (ROBITUSSIN DM) 100-10 MG/5ML syrup Take 5 mLs by mouth every 4 (four) hours as needed for cough (and congestion). 118 mL 0   . ibuprofen (ADVIL,MOTRIN) 800 MG tablet Take 1 tablet (800 mg total) by mouth every 8 (eight) hours as needed for mild pain or moderate pain. 20 tablet 0   . predniSONE (DELTASONE) 20 MG tablet Take 2 tablets (40 mg total) by mouth daily. 10 tablet 0   . Prenatal Vit-Fe Fumarate-FA (MULTIVITAMIN-PRENATAL) 27-0.8 MG TABS tablet Take 1 tablet by mouth every evening.   12/08/2016 at Unknown time  . progesterone (PROMETRIUM) 100 MG capsule Take 1 tablet by mouth daily.   12/09/2016 at Unknown time  . Spacer/Aero-Holding Chambers (AEROCHAMBER PLUS WITH MASK) inhaler Use as instructed 1 each 2   . Thyroid (NATURE-THROID) 97.5 MG TABS Take 1 tablet by mouth 2 (two) times daily.   12/09/2016 at Unknown time    ROS:  Sharp pelvic pain Physical Exam   Blood pressure 134/75, pulse (!) 104, temperature 98.2 F (36.8 C), temperature source Oral, resp. rate 20, weight 125.8 kg (277 lb 4 oz), last menstrual period 04/04/2017, SpO2 100 %.    Physical Exam  Tearful Chest clear Heart RRR without murmur Abd soft, NT Pelvic--minimal d/c in vault, cervix closed, long, NT, uterus small, mildly tender.  Adnexa--no masses, NT Ext WNL  ED Course  Assessment: Early pregnancy Pelvic pain Hx recurrent miscarriages  Plan: Wet prep UA [name] for viability/dating. Support to patient for concerns regarding pregnancy.   Donnel Saxon CNM, MSN 06/03/2017 4:25 PM   Addendum: Returned from [name]:   FINDINGS: Intrauterine gestational sac: Single  Yolk sac:  Visualized.  Embryo:  Visualized.  Cardiac Activity: Visualized.  Heart Rate: 168   bpm  CRL:  20.5  mm   8 w   4 d                  [name] EDC: 01/09/2018  Subchorionic hemorrhage:  None visualized.  Maternal uterus/adnexae: Multiple small fibroids. There is anterior 1.8 x 1.7 x 2.0 cm fibroid. There is a posterior 3.3 x 1.5 x 3.4 cm fibroid. In the mid uterine segment there is a 2.0 x 1.7 x 1.8 cm fibroid. Both ovaries are visualized and are within normal limits. No adnexal masses. No pelvic free fluid.  IMPRESSION: 1. Single live intrauterine pregnancy with a measured gestational age of [redacted] weeks and 4 days consistent with  the expected gestational age based on the last menstrual period. 2. No emergent pregnancy or maternal complication. 3. Small uterine fibroids. 4. Normal ovaries and adnexa.  Results for orders placed or performed during the hospital encounter of 06/03/17 (from the past 24 hour(s))  Urinalysis, Routine w reflex microscopic     Status: None   Collection Time: 06/03/17  3:40 PM  Result Value Ref Range   Color, Urine YELLOW YELLOW   APPearance CLEAR CLEAR   Specific Gravity, Urine 1.021 1.005 - 1.030   pH 6.0 5.0 - 8.0   Glucose, UA NEGATIVE NEGATIVE mg/dL   Hgb urine dipstick NEGATIVE NEGATIVE   Bilirubin Urine NEGATIVE NEGATIVE   Ketones, ur NEGATIVE NEGATIVE mg/dL   Protein, ur NEGATIVE NEGATIVE mg/dL   Nitrite NEGATIVE NEGATIVE   Leukocytes, UA NEGATIVE NEGATIVE  Pregnancy, urine POC     Status: Abnormal   Collection Time: 06/03/17  3:43 PM  Result Value Ref Range   Preg Test, Ur POSITIVE (A) NEGATIVE  Wet prep, genital     Status: Abnormal   Collection Time: 06/03/17  4:15 PM  Result Value Ref Range   Yeast Wet Prep HPF POC NONE SEEN NONE SEEN   Trich, Wet Prep NONE SEEN NONE SEEN   Clue Cells Wet Prep HPF POC NONE SEEN NONE SEEN   WBC, Wet Prep HPF POC MANY (A) NONE SEEN   Sperm NONE SEEN    Reviewed positive US findings with patient and husband--they were very relieved.  D/c'd home with routine pregnancy precautions. Patient  to f/u as scheduled for NOB w/u on 06/15/17, to call prn with any issues or concerns.  Donnel Saxon, CNM 06/03/17 5:26p

## 2017-06-06 ENCOUNTER — Encounter: Payer: No Typology Code available for payment source | Admitting: Family Medicine

## 2017-08-03 ENCOUNTER — Encounter (HOSPITAL_COMMUNITY): Payer: Self-pay

## 2017-08-03 ENCOUNTER — Inpatient Hospital Stay (HOSPITAL_COMMUNITY)
Admission: AD | Admit: 2017-08-03 | Payer: Medicaid Other | Source: Ambulatory Visit | Admitting: Obstetrics and Gynecology

## 2017-08-03 ENCOUNTER — Inpatient Hospital Stay (HOSPITAL_COMMUNITY)
Admission: AD | Admit: 2017-08-03 | Discharge: 2017-08-03 | Disposition: A | Discharge status: Home / Self Care | Payer: Medicaid Other | Source: Ambulatory Visit | Attending: Obstetrics and Gynecology | Admitting: Obstetrics and Gynecology

## 2017-08-03 ENCOUNTER — Inpatient Hospital Stay (HOSPITAL_COMMUNITY): Payer: Medicaid Other

## 2017-08-03 DIAGNOSIS — R0789 Other chest pain: Secondary | ICD-10-CM | POA: Insufficient documentation

## 2017-08-03 DIAGNOSIS — Z3A17 17 weeks gestation of pregnancy: Secondary | ICD-10-CM | POA: Insufficient documentation

## 2017-08-03 DIAGNOSIS — Z79899 Other long term (current) drug therapy: Secondary | ICD-10-CM | POA: Insufficient documentation

## 2017-08-03 DIAGNOSIS — R0602 Shortness of breath: Secondary | ICD-10-CM | POA: Insufficient documentation

## 2017-08-03 DIAGNOSIS — J45909 Unspecified asthma, uncomplicated: Secondary | ICD-10-CM | POA: Insufficient documentation

## 2017-08-03 DIAGNOSIS — F410 Panic disorder [episodic paroxysmal anxiety] without agoraphobia: Secondary | ICD-10-CM | POA: Insufficient documentation

## 2017-08-03 DIAGNOSIS — Z87891 Personal history of nicotine dependence: Secondary | ICD-10-CM | POA: Diagnosis not present

## 2017-08-03 DIAGNOSIS — K219 Gastro-esophageal reflux disease without esophagitis: Secondary | ICD-10-CM | POA: Diagnosis not present

## 2017-08-03 DIAGNOSIS — E039 Hypothyroidism, unspecified: Secondary | ICD-10-CM | POA: Diagnosis not present

## 2017-08-03 DIAGNOSIS — O99519 Diseases of the respiratory system complicating pregnancy, unspecified trimester: Secondary | ICD-10-CM

## 2017-08-03 DIAGNOSIS — O26892 Other specified pregnancy related conditions, second trimester: Secondary | ICD-10-CM | POA: Diagnosis not present

## 2017-08-03 LAB — URINALYSIS, ROUTINE W REFLEX MICROSCOPIC
BILIRUBIN URINE: NEGATIVE
GLUCOSE, UA: NEGATIVE mg/dL
Hgb urine dipstick: NEGATIVE
KETONES UR: 20 mg/dL — AB
LEUKOCYTES UA: NEGATIVE
NITRITE: NEGATIVE
PROTEIN: NEGATIVE mg/dL
Specific Gravity, Urine: 1.014 (ref 1.005–1.030)
pH: 5 (ref 5.0–8.0)

## 2017-08-03 MED ORDER — GI COCKTAIL ~~LOC~~
30.0000 mL | Freq: Once | ORAL | Status: AC
  Administered 2017-08-03: 30 mL via ORAL
  Filled 2017-08-03: qty 30

## 2017-08-03 MED ORDER — ALBUTEROL SULFATE (2.5 MG/3ML) 0.083% IN NEBU
2.5000 mg | INHALATION_SOLUTION | Freq: Once | RESPIRATORY_TRACT | Status: AC
  Administered 2017-08-03: 2.5 mg via RESPIRATORY_TRACT
  Filled 2017-08-03: qty 3

## 2017-08-03 NOTE — MAU Note (Signed)
Patient presents with SOB and chest tightness beginning at 2330 last night.  Got worse around 1300 today.  Hx of asthma, used inhaler around 1100 this morning-got better temporarily, now not improving.  No VB or discharge.

## 2017-08-03 NOTE — MAU Provider Note (Signed)
Patient Sharon Benson is a 41 y.o. G5P1021 at [redacted]w[redacted]d here with complaints of shortness of breath and chest tightness. She denies bleeding or leaking of fluid.  The last time she had this feeling she was diagnosed with pneumonia in January. She is here to get checked out because she was worried about her baby.  History     CSN: [id number]  Arrival date and time: 08/03/17 1420   First Provider Initiated Contact with Patient 08/03/17 1522      Chief Complaint  Patient presents with  . Chest Pain   HPI Patient states that she was diagnosed with asthma about 4 years ago but has never had an asthma attack. She says that she had pneumonia in January and it was not diagnosed until after 4 visits. The presentation at that time was similar (chest pain, difficulty breathing). Patient used her inhaler this morning and it helped for about 2 hours. She feels the chest pain more when she lies down; she found it hard to sleep last night when she was lying down.  OB History    Gravida Para Term Preterm AB Living   5 1 1   2 1    SAB TAB Ectopic Multiple Live Births   2       1      Past Medical History:  Diagnosis Date  . Anxiety    panic attack - not recent 2001ish  . Arthritis   . Endometriosis   . Fibroid   . GERD (gastroesophageal reflux disease)   . Headache(784.0)   . Hypothyroidism   . Palpitations   . PONV (postoperative nausea and vomiting)    last 2- no vomiting-  Seizures- not with last 2.  . Seizures ([place])    last seizure 08/11/13, last one 12/14/14-  . SVD (spontaneous vaginal delivery)    x 1    Past Surgical History:  Procedure Laterality Date  . BREAST REDUCTION SURGERY    . BREAST SURGERY  1997  . DILATION AND CURETTAGE OF UTERUS    . DILATION AND EVACUATION N/A 11/25/2013   Procedure: DILATATION AND EVACUATION;  Surgeon: Woodroe Mode, MD;  Location: [facility];  Service: Gynecology;  Laterality: N/A;  . DILATION AND EVACUATION N/A 11/30/2013   Procedure: DILATATION  AND EVACUATION;  Surgeon: Jonnie Kind, MD;  Location: [facility];  Service: Gynecology;  Laterality: N/A;  . DILATION AND EVACUATION N/A 05/02/2014   Procedure: DILATATION AND EVACUATION;  Surgeon: Ena Dawley, MD;  Location: [facility];  Service: Gynecology;  Laterality: N/A;  . GANGLION CYST EXCISION  1999   right hand  . THYROIDECTOMY  01/02/2015  . THYROIDECTOMY Bilateral 01/02/2015   Procedure: TOTAL THYROIDECTOMY;  Surgeon: Melissa Montane, MD;  Location: [facility];  Service: ENT;  Laterality: Bilateral;    Family History  Problem Relation Age of Onset  . Lung cancer Maternal Grandmother   . Colon cancer Maternal Grandmother   . Cancer Other   . Diabetes Other   . High blood pressure Other   . Diabetes Mother   . Hypertension Father   . Hypertension Paternal Grandmother   . Diabetes Paternal Grandmother   . Heart disease Paternal Grandmother     Social History  Substance Use Topics  . Smoking status: Former Smoker    Years: 2.00    Types: Cigarettes    Quit date: 04/28/1997  . Smokeless tobacco: Never Used     Comment: smoked in HS  . Alcohol use  No    Allergies:  Allergies  Allergen Reactions  . Other Anaphylaxis and Itching    Cantaloupe  Pt has Vertigo  . Cocoa Butter Hives  . Codeine Other (See Comments)    seizures    Prescriptions Prior to Admission  Medication Sig Dispense Refill Last Dose  . albuterol (PROVENTIL HFA;VENTOLIN HFA) 108 (90 Base) MCG/ACT inhaler Inhale 2 puffs into the lungs every 4 (four) hours as needed for wheezing or shortness of breath. 1 Inhaler 3 08/03/2017 at 1100  . Cholecalciferol (VITAMIN D-3) 5000 UNITS TABS Take 10,000 Units by mouth every evening.    12/08/2016 at Unknown time  . folic acid (FOLVITE) 1 MG tablet Take 4 mg by mouth daily.   12/08/2016 at Unknown time  . Prenatal Vit-Fe Fumarate-FA (MULTIVITAMIN-PRENATAL) 27-0.8 MG TABS tablet Take 1 tablet by mouth every evening.   12/08/2016 at Unknown time  . Spacer/Aero-Holding  Chambers (AEROCHAMBER PLUS WITH MASK) inhaler Use as instructed 1 each 2 08/03/2017 at 1100  . Thyroid (NATURE-THROID) 97.5 MG TABS Take 1 tablet by mouth 2 (two) times daily.   12/09/2016 at Unknown time    Review of Systems  HENT: Negative.   Respiratory: Positive for chest tightness and shortness of breath.   Gastrointestinal: Negative.   Genitourinary: Negative.   Musculoskeletal: Negative.   Skin: Negative.   Neurological: Negative.   Psychiatric/Behavioral: Negative.    Physical Exam   Blood pressure 129/83, pulse 100, temperature 98 F (36.7 C), temperature source Oral, resp. rate 20, last menstrual period 04/04/2017, SpO2 98 %.  Physical Exam  Constitutional: She is oriented to person, place, and time. She appears well-developed and well-nourished.  HENT:  Head: Normocephalic.  Neck: Normal range of motion.  Respiratory: Effort normal and breath sounds normal. No respiratory distress. She has no wheezes. She has no rales. She exhibits no tenderness.  GI: Soft.  Musculoskeletal: Normal range of motion.  Neurological: She is alert and oriented to person, place, and time. She has normal reflexes.  Skin: Skin is warm and dry.  Psychiatric: She has a normal mood and affect.    MAU Course  Procedures  MDM -EKG: normal sinus rythmn with non-specific ST abnormality; reviewed by Cardiology and no differences from prior EKG -Breathing treatment-patient feels slightly better -2 D Chest Xray: -GI cocktail -FHR  145 Assessment and Plan    Mervyn Skeeters Kooistra 08/03/2017, 5:36 PM     ADDENDUM:  Assumed care at 7 p.m. Awaiting CXR results due to pt w/ sternal pain. CXR results as follows:  CLINICAL DATA:  Chest pain and shortness of breath.  EXAM: CHEST  2 VIEW  COMPARISON:  Chest radiograph 12/11/2016  FINDINGS: Normal cardiac and mediastinal contours. No consolidative pulmonary opacities. No pleural effusion or pneumothorax. Thoracic spine degenerative  changes.  IMPRESSION: No acute cardiopulmonary process.   Electronically Signed   By: Lovey Newcomer M.D.   On: 08/03/2017 18:49   P: CXR results reviewed; results neg. Will have office contact pt for f/u appt from this visit early next week. Continue Albuterol. Return to MAU for worsening sxs. SAB precautions.  Farrel Gordon, CNM 08/03/17, 7:40 PM

## 2017-08-03 NOTE — Discharge Instructions (Signed)
Shortness of Breath, Adult Shortness of breath means you have trouble breathing. Your lungs are organs for breathing. Follow these instructions at home: Pay attention to any changes in your symptoms. Take these actions to help with your condition:  Do not smoke. Smoking can cause shortness of breath. If you need help to quit smoking, ask your doctor.  Avoid things that can make it harder to breathe, such as: ? Mold. ? Dust. ? Air pollution. ? Chemical smells. ? Things that can cause allergy symptoms (allergens), if you have allergies.  Keep your living space clean and free of mold and dust.  Rest as needed. Slowly return to your usual activities.  Take over-the-counter and prescription medicines, including oxygen and inhaled medicines, only as told by your doctor.  Keep all follow-up visits as told by your doctor. This is important.  Contact a doctor if:  Your condition does not get better as soon as expected.  You have a hard time doing your normal activities, even after you rest.  You have new symptoms. Get help right away if:  You have trouble breathing when you are resting.  You feel light-headed or you faint.  You have a cough that is not helped by medicines.  You cough up blood.  You have pain with breathing.  You have pain in your chest, arms, shoulders, or belly (abdomen).  You have a fever.  You cannot walk up stairs.  You cannot exercise the way you normally do. This information is not intended to replace advice given to you by your health care provider. Make sure you discuss any questions you have with your health care provider. Document Released: 05/16/2008 Document Revised: 12/15/2016 Document Reviewed: 12/15/2016 Elsevier Interactive Patient Education  2017 [place]. Angina Pectoris Angina pectoris is a very bad feeling in the chest, neck, or arm. Your doctor may call it angina. There are four types of angina. Angina is caused by a lack of blood  in the middle and thickest layer of the heart wall (myocardium). Angina may feel like a crushing or squeezing pain in the chest. It may feel like tightness or heavy pressure in the chest. Some people say it feels like gas, heartburn, or indigestion. Some people have symptoms other than pain. These include:  Shortness of breath.  Cold sweats.  Feeling sick to your stomach (nausea).  Feeling light-headed.  Many women have chest discomfort and some of the other symptoms. However, women often have different symptoms, such as:  Feeling tired (fatigue).  Feeling nervous for no reason.  Feeling weak for no reason.  Dizziness or fainting.  Women may have angina without any symptoms. Follow these instructions at home:  Take medicines only as told by your doctor.  Take care of other health issues as told by your doctor. These include: ? High blood pressure (hypertension). ? Diabetes.  Follow a heart-healthy diet. Your doctor can help you to choose healthy food options and make changes.  Talk to your doctor to learn more about healthy cooking methods and use them. These include: ? Roasting. ? Grilling. ? Broiling. ? Baking. ? Poaching. ? Steaming. ? Stir-frying.  Follow an exercise program approved by your doctor.  Keep a healthy weight. Lose weight as told by your doctor.  Rest when you are tired.  Learn to manage stress.  Do not use any tobacco, such as cigarettes, chewing tobacco, or electronic cigarettes. If you need help quitting, ask your doctor.  If you drink alcohol, and your  doctor says it is okay, limit yourself to no more than 1 drink per day. One drink equals 12 ounces of beer, 5 ounces of wine, or 1 ounces of hard liquor.  Stop illegal drug use.  Keep all follow-up visits as told by your doctor. This is important. Do not take these medicines unless your doctor says that you can:  Nonsteroidal anti-inflammatory drugs (NSAIDs). These  include: ? Ibuprofen. ? Naproxen. ? Celecoxib.  Vitamin supplements that have vitamin A, vitamin E, or both.  Hormone therapy that contains estrogen with or without progestin.  Get help right away if:  You have pain in your chest, neck, arm, jaw, stomach, or back that: ? Lasts more than a few minutes. ? Comes back. ? Does not get better after you take medicine under your tongue (sublingual nitroglycerin).  You have any of these symptoms for no reason: ? Gas, heartburn, or indigestion. ? Sweating a lot. ? Shortness of breath or trouble breathing. ? Feeling sick to your stomach or throwing up. ? Feeling more tired than usual. ? Feeling nervous or worrying more than usual. ? Feeling weak. ? Diarrhea.  You are suddenly dizzy or light-headed.  You faint or pass out. These symptoms may be an emergency. Do not wait to see if the symptoms will go away. Get medical help right away. Call your local emergency services (911 in the [place] Do not drive yourself to the hospital. This information is not intended to replace advice given to you by your health care provider. Make sure you discuss any questions you have with your health care provider. Document Released: 05/16/2008 Document Revised: 05/05/2016 Document Reviewed: 04/01/2014 Elsevier Interactive Patient Education  2017 [employer].

## 2017-08-04 ENCOUNTER — Encounter: Payer: Self-pay | Admitting: Obstetrics and Gynecology

## 2017-10-31 ENCOUNTER — Telehealth: Payer: Self-pay | Admitting: Genetic Counselor

## 2017-10-31 NOTE — Telephone Encounter (Signed)
Pt called to reschedule genetic counseling appt to 12/31 at 1pm.

## 2017-11-01 ENCOUNTER — Other Ambulatory Visit: Payer: Medicaid Other

## 2017-11-01 ENCOUNTER — Encounter: Payer: Medicaid Other | Admitting: Genetic Counselor

## 2017-11-11 ENCOUNTER — Encounter (HOSPITAL_COMMUNITY): Payer: Self-pay | Admitting: *Deleted

## 2017-11-11 ENCOUNTER — Other Ambulatory Visit: Payer: Self-pay

## 2017-11-11 ENCOUNTER — Inpatient Hospital Stay (HOSPITAL_COMMUNITY)
Admission: AD | Admit: 2017-11-11 | Discharge: 2017-11-11 | Disposition: A | Discharge status: Home / Self Care | Payer: Medicaid Other | Source: Ambulatory Visit | Attending: Obstetrics and Gynecology | Admitting: Obstetrics and Gynecology

## 2017-11-11 DIAGNOSIS — O99353 Diseases of the nervous system complicating pregnancy, third trimester: Secondary | ICD-10-CM | POA: Insufficient documentation

## 2017-11-11 DIAGNOSIS — O368130 Decreased fetal movements, third trimester, not applicable or unspecified: Secondary | ICD-10-CM | POA: Insufficient documentation

## 2017-11-11 DIAGNOSIS — O99513 Diseases of the respiratory system complicating pregnancy, third trimester: Secondary | ICD-10-CM | POA: Insufficient documentation

## 2017-11-11 DIAGNOSIS — G43909 Migraine, unspecified, not intractable, without status migrainosus: Secondary | ICD-10-CM | POA: Insufficient documentation

## 2017-11-11 DIAGNOSIS — D259 Leiomyoma of uterus, unspecified: Secondary | ICD-10-CM | POA: Insufficient documentation

## 2017-11-11 DIAGNOSIS — Z87891 Personal history of nicotine dependence: Secondary | ICD-10-CM | POA: Insufficient documentation

## 2017-11-11 DIAGNOSIS — Z8249 Family history of ischemic heart disease and other diseases of the circulatory system: Secondary | ICD-10-CM | POA: Diagnosis not present

## 2017-11-11 DIAGNOSIS — G40909 Epilepsy, unspecified, not intractable, without status epilepticus: Secondary | ICD-10-CM | POA: Diagnosis not present

## 2017-11-11 DIAGNOSIS — O4703 False labor before 37 completed weeks of gestation, third trimester: Secondary | ICD-10-CM | POA: Insufficient documentation

## 2017-11-11 DIAGNOSIS — Z6841 Body Mass Index (BMI) 40.0 and over, adult: Secondary | ICD-10-CM | POA: Diagnosis not present

## 2017-11-11 DIAGNOSIS — J45909 Unspecified asthma, uncomplicated: Secondary | ICD-10-CM | POA: Diagnosis not present

## 2017-11-11 DIAGNOSIS — F410 Panic disorder [episodic paroxysmal anxiety] without agoraphobia: Secondary | ICD-10-CM | POA: Diagnosis not present

## 2017-11-11 DIAGNOSIS — R002 Palpitations: Secondary | ICD-10-CM | POA: Diagnosis not present

## 2017-11-11 DIAGNOSIS — Z91048 Other nonmedicinal substance allergy status: Secondary | ICD-10-CM | POA: Insufficient documentation

## 2017-11-11 DIAGNOSIS — O99613 Diseases of the digestive system complicating pregnancy, third trimester: Secondary | ICD-10-CM | POA: Diagnosis not present

## 2017-11-11 DIAGNOSIS — Z79899 Other long term (current) drug therapy: Secondary | ICD-10-CM | POA: Insufficient documentation

## 2017-11-11 DIAGNOSIS — E039 Hypothyroidism, unspecified: Secondary | ICD-10-CM | POA: Insufficient documentation

## 2017-11-11 DIAGNOSIS — K219 Gastro-esophageal reflux disease without esophagitis: Secondary | ICD-10-CM | POA: Insufficient documentation

## 2017-11-11 DIAGNOSIS — Z3A31 31 weeks gestation of pregnancy: Secondary | ICD-10-CM | POA: Insufficient documentation

## 2017-11-11 DIAGNOSIS — Z833 Family history of diabetes mellitus: Secondary | ICD-10-CM | POA: Insufficient documentation

## 2017-11-11 DIAGNOSIS — Z801 Family history of malignant neoplasm of trachea, bronchus and lung: Secondary | ICD-10-CM | POA: Diagnosis not present

## 2017-11-11 DIAGNOSIS — E049 Nontoxic goiter, unspecified: Secondary | ICD-10-CM | POA: Diagnosis not present

## 2017-11-11 DIAGNOSIS — Z800 Family history of malignant neoplasm of digestive organs: Secondary | ICD-10-CM | POA: Insufficient documentation

## 2017-11-11 DIAGNOSIS — Z9889 Other specified postprocedural states: Secondary | ICD-10-CM | POA: Diagnosis not present

## 2017-11-11 DIAGNOSIS — O3413 Maternal care for benign tumor of corpus uteri, third trimester: Secondary | ICD-10-CM | POA: Insufficient documentation

## 2017-11-11 DIAGNOSIS — Z91018 Allergy to other foods: Secondary | ICD-10-CM | POA: Insufficient documentation

## 2017-11-11 DIAGNOSIS — O99213 Obesity complicating pregnancy, third trimester: Secondary | ICD-10-CM | POA: Insufficient documentation

## 2017-11-11 DIAGNOSIS — Z885 Allergy status to narcotic agent status: Secondary | ICD-10-CM | POA: Insufficient documentation

## 2017-11-11 LAB — URINALYSIS, ROUTINE W REFLEX MICROSCOPIC
BILIRUBIN URINE: NEGATIVE
Glucose, UA: 150 mg/dL — AB
HGB URINE DIPSTICK: NEGATIVE
KETONES UR: NEGATIVE mg/dL
Leukocytes, UA: NEGATIVE
Nitrite: NEGATIVE
PROTEIN: NEGATIVE mg/dL
SPECIFIC GRAVITY, URINE: 1.02 (ref 1.005–1.030)
pH: 5 (ref 5.0–8.0)

## 2017-11-11 NOTE — MAU Provider Note (Signed)
History    Sharon Benson is a [id number] at 31.4wks who presents, after phone call, for decreased fetal movement and contractions. Patient states that she had not felt fetus move for half the day and the most recent movements have not been as prominent.  Patient also reports contractions hourly everyday, but does admit that they are rarely painful.  Patient denies LOF, VB, and reports some vaginal discharge but denies odor, itching, and overall concern.   12 hr Recall Breakfast: Ham biscuit, Bo' rounds  Snack: PB Crackers  Lunch: Cheeseburger, Lucendia Herrlich   Drinking water t/o the day from 2L jug that is almost empty   Patient Active Problem List   Diagnosis Date Noted  . Asthma affecting pregnancy, antepartum 08/03/2017  . SOB (shortness of breath) 08/03/2017  . Feeling of chest tightness 08/03/2017  . Fibroids 06/03/2017  . AMA (advanced maternal age) multigravida 35+ 06/03/2017  . 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 Morbid obesity with BMI of 45.0-49.9, adult ([place]) 06/03/2017  . Palpitations 11/09/2015  . Thyroid mass 01/02/2015  . GOITER, UNSPECIFIED 07/20/2010  . MIGRAINE HEADACHE 07/20/2010  . SEIZURE DISORDER, HX OF 07/20/2010    Chief Complaint  Patient presents with  . Decreased Fetal Movement   HPI  OB History    Gravida Para Term Preterm AB Living   5 1 1   3 1    SAB TAB Ectopic Multiple Live Births   3       1      Past Medical History:  Diagnosis Date  . Anxiety    panic attack - not recent 2001ish  . Arthritis   . Endometriosis   . Fibroid   . GERD (gastroesophageal reflux disease)   . Headache(784.0)   . Hypothyroidism   . Palpitations   . PONV (postoperative nausea and vomiting)    last 2- no vomiting-  Seizures- not with last 2.  . Seizures ([place])    last seizure 04/2017  . SVD (spontaneous vaginal delivery)    x 1    Past Surgical History:  Procedure Laterality Date  . BREAST REDUCTION SURGERY    . BREAST SURGERY  1997  .  DILATION AND CURETTAGE OF UTERUS    . DILATION AND EVACUATION N/A 11/25/2013   Procedure: DILATATION AND EVACUATION;  Surgeon: Woodroe Mode, MD;  Location: [facility];  Service: Gynecology;  Laterality: N/A;  . DILATION AND EVACUATION N/A 11/30/2013   Procedure: DILATATION AND EVACUATION;  Surgeon: Jonnie Kind, MD;  Location: [facility];  Service: Gynecology;  Laterality: N/A;  . DILATION AND EVACUATION N/A 05/02/2014   Procedure: DILATATION AND EVACUATION;  Surgeon: Ena Dawley, MD;  Location: [facility];  Service: Gynecology;  Laterality: N/A;  . GANGLION CYST EXCISION  1999   right hand  . THYROIDECTOMY  01/02/2015  . THYROIDECTOMY Bilateral 01/02/2015   Procedure: TOTAL THYROIDECTOMY;  Surgeon: Melissa Montane, MD;  Location: [facility];  Service: ENT;  Laterality: Bilateral;    Family History  Problem Relation Age of Onset  . Lung cancer Maternal Grandmother   . Colon cancer Maternal Grandmother   . Cancer Other   . Diabetes Other   . High blood pressure Other   . Diabetes Mother   . Hypertension Father   . Hypertension Paternal Grandmother   . Diabetes Paternal Grandmother   . Heart disease Paternal Grandmother     Social History   Tobacco Use  . Smoking status: Former Smoker  Years: 2.00    Types: Cigarettes    Last attempt to quit: 04/28/1997    Years since quitting: 20.5  . Smokeless tobacco: Never Used  . Tobacco comment: smoked in HS  Substance Use Topics  . Alcohol use: No  . Drug use: No    Comment: 18 yrs ago    Allergies:  Allergies  Allergen Reactions  . Other Anaphylaxis and Itching    Cantaloupe  Pt has Vertigo  . Cocoa Butter Hives  . Codeine Other (See Comments)    seizures    Medications Prior to Admission  Medication Sig Dispense Refill Last Dose  . albuterol (PROVENTIL HFA;VENTOLIN HFA) 108 (90 Base) MCG/ACT inhaler Inhale 2 puffs into the lungs every 4 (four) hours as needed for wheezing or shortness of breath. 1 Inhaler 3 08/03/2017 at 1100  .  Cholecalciferol (VITAMIN D-3) 5000 UNITS TABS Take 10,000 Units by mouth every evening.    12/08/2016 at Unknown time  . folic acid (FOLVITE) 1 MG tablet Take 4 mg by mouth daily.   12/08/2016 at Unknown time  . Prenatal Vit-Fe Fumarate-FA (MULTIVITAMIN-PRENATAL) 27-0.8 MG TABS tablet Take 1 tablet by mouth every evening.   12/08/2016 at Unknown time  . Spacer/Aero-Holding Chambers (AEROCHAMBER PLUS WITH MASK) inhaler Use as instructed 1 each 2 08/03/2017 at 1100  . Thyroid (NATURE-THROID) 97.5 MG TABS Take 1 tablet by mouth 2 (two) times daily.   12/09/2016 at Unknown time    ROS  See HPI Above Physical Exam   Blood pressure 133/88, pulse 99, temperature 98 F (36.7 C), temperature source Oral, resp. rate 20, height 5\' 4"  (1.626 m), weight 133.7 kg (294 lb 12 oz), last menstrual period 04/04/2017, SpO2 100 %.  Results for orders placed or performed during the hospital encounter of 11/11/17 (from the past 24 hour(s))  Urinalysis, Routine w reflex microscopic     Status: Abnormal   Collection Time: 11/11/17  6:35 PM  Result Value Ref Range   Color, Urine YELLOW YELLOW   APPearance HAZY (A) CLEAR   Specific Gravity, Urine 1.020 1.005 - 1.030   pH 5.0 5.0 - 8.0   Glucose, UA 150 (A) NEGATIVE mg/dL   Hgb urine dipstick NEGATIVE NEGATIVE   Bilirubin Urine NEGATIVE NEGATIVE   Ketones, ur NEGATIVE NEGATIVE mg/dL   Protein, ur NEGATIVE NEGATIVE mg/dL   Nitrite NEGATIVE NEGATIVE   Leukocytes, UA NEGATIVE NEGATIVE    Physical Exam  Constitutional: She is oriented to person, place, and time. Vital signs are normal.  Obese  HENT:  Head: Normocephalic and atraumatic.  Eyes: Conjunctivae are normal.  Neck: Normal range of motion.  Cardiovascular: Normal rate, regular rhythm and normal heart sounds.  Respiratory: Effort normal and breath sounds normal.  GI: Soft. Bowel sounds are normal.  Gravid--fundal height appears LGA, Soft, NT  Musculoskeletal: Normal range of motion. She exhibits no  edema.  Neurological: She is alert and oriented to person, place, and time.  Skin: Skin is warm and dry.     FHR:135 bpm, Mod Var, -Decels, +Accels UC: Irritability graphed, palpates soft ED Course  Assessment: IUP at 31.4wks Cat I FT Decreased Fetal Movement BH Contractions   Plan: -Reactive NST -Discussed BH vs Real Contractions -Encouraged to monitor fetal movement and continue to report any concerns -Discussed high protein, low fat diet -No other q/c -Keep appt as scheduled: 11/13/2017 -Encouraged to call if any questions or concerns arise prior to next scheduled office visit.   [place],  MSN 11/11/2017 7:24 PM

## 2017-11-11 NOTE — MAU Note (Signed)
Pt presents with c/o no FM since 1300 this afternoon.  Denies LOf or VB.  Pt states having irregular ctxs, states been going on for weeks now.  Reports presence of vaginal discharge, no odor.

## 2017-11-11 NOTE — Discharge Instructions (Signed)
Fetal Movement Counts °Patient Name: ________________________________________________ Patient Due Date: ____________________ °What is a fetal movement count? °A fetal movement count is the number of times that you feel your baby move during a certain amount of time. This may also be called a fetal kick count. A fetal movement count is recommended for every pregnant woman. You may be asked to start counting fetal movements as early as week 28 of your pregnancy. °Pay attention to when your baby is most active. You may notice your baby's sleep and wake cycles. You may also notice things that make your baby move more. You should do a fetal movement count: °· When your baby is normally most active. °· At the same time each day. ° °A good time to count movements is while you are resting, after having something to eat and drink. °How do I count fetal movements? °1. Find a quiet, comfortable area. Sit, or lie down on your side. °2. Write down the date, the start time and stop time, and the number of movements that you felt between those two times. Take this information with you to your health care visits. °3. For 2 hours, count kicks, flutters, swishes, rolls, and jabs. You should feel at least 10 movements during 2 hours. °4. You may stop counting after you have felt 10 movements. °5. If you do not feel 10 movements in 2 hours, have something to eat and drink. Then, keep resting and counting for 1 hour. If you feel at least 4 movements during that hour, you may stop counting. °Contact a health care provider if: °· You feel fewer than 4 movements in 2 hours. °· Your baby is not moving like he or she usually does. °Date: ____________ Start time: ____________ Stop time: ____________ Movements: ____________ °Date: ____________ Start time: ____________ Stop time: ____________ Movements: ____________ °Date: ____________ Start time: ____________ Stop time: ____________ Movements: ____________ °Date: ____________ Start time:  ____________ Stop time: ____________ Movements: ____________ °Date: ____________ Start time: ____________ Stop time: ____________ Movements: ____________ °Date: ____________ Start time: ____________ Stop time: ____________ Movements: ____________ °Date: ____________ Start time: ____________ Stop time: ____________ Movements: ____________ °Date: ____________ Start time: ____________ Stop time: ____________ Movements: ____________ °Date: ____________ Start time: ____________ Stop time: ____________ Movements: ____________ °This information is not intended to replace advice given to you by your health care provider. Make sure you discuss any questions you have with your health care provider. °Document Released: 12/28/2006 Document Revised: 07/27/2016 Document Reviewed: 01/07/2016 °Elsevier Interactive Patient Education © 2018 [employer] °Braxton Hicks Contractions °Contractions of the uterus can occur throughout pregnancy, but they are not always a sign that you are in labor. You may have practice contractions called Braxton Hicks contractions. These false labor contractions are sometimes confused with true labor. °What are Braxton Hicks contractions? °Braxton Hicks contractions are tightening movements that occur in the muscles of the uterus before labor. Unlike true labor contractions, these contractions do not result in opening (dilation) and thinning of the cervix. Toward the end of pregnancy (32-34 weeks), Braxton Hicks contractions can happen more often and may become stronger. These contractions are sometimes difficult to tell apart from true labor because they can be very uncomfortable. You should not feel embarrassed if you go to the hospital with false labor. °Sometimes, the only way to tell if you are in true labor is for your health care provider to look for changes in the cervix. The health care provider will do a physical exam and may monitor your contractions. If   you are not in true labor, the exam  should show that your cervix is not dilating and your water has not broken. °If there are no prenatal problems or other health problems associated with your pregnancy, it is completely safe for you to be sent home with false labor. You may continue to have Braxton Hicks contractions until you go into true labor. °How can I tell the difference between true labor and false labor? °· Differences °? False labor °? Contractions last 30-70 seconds.: Contractions are usually shorter and not as strong as true labor contractions. °? Contractions become very regular.: Contractions are usually irregular. °? Discomfort is usually felt in the top of the uterus, and it spreads to the lower abdomen and low back.: Contractions are often felt in the front of the lower abdomen and in the groin. °? Contractions do not go away with walking.: Contractions may go away when you walk around or change positions while lying down. °? Contractions usually become more intense and increase in frequency.: Contractions get weaker and are shorter-lasting as time goes on. °? The cervix dilates and gets thinner.: The cervix usually does not dilate or become thin. °Follow these instructions at home: °· Take over-the-counter and prescription medicines only as told by your health care provider. °· Keep up with your usual exercises and follow other instructions from your health care provider. °· Eat and drink lightly if you think you are going into labor. °· If Braxton Hicks contractions are making you uncomfortable: °? Change your position from lying down or resting to walking, or change from walking to resting. °? Sit and rest in a tub of warm water. °? Drink enough fluid to keep your urine clear or pale yellow. Dehydration may cause these contractions. °? Do slow and deep breathing several times an hour. °· Keep all follow-up prenatal visits as told by your health care provider. This is important. °Contact a health care provider if: °· You have a  fever. °· You have continuous pain in your abdomen. °Get help right away if: °· Your contractions become stronger, more regular, and closer together. °· You have fluid leaking or gushing from your vagina. °· You pass blood-tinged mucus (bloody show). °· You have bleeding from your vagina. °· You have low back pain that you never had before. °· You feel your baby’s head pushing down and causing pelvic pressure. °· Your baby is not moving inside you as much as it used to. °Summary °· Contractions that occur before labor are called Braxton Hicks contractions, false labor, or practice contractions. °· Braxton Hicks contractions are usually shorter, weaker, farther apart, and less regular than true labor contractions. True labor contractions usually become progressively stronger and regular and they become more frequent. °· Manage discomfort from Braxton Hicks contractions by changing position, resting in a warm bath, drinking plenty of water, or practicing deep breathing. °This information is not intended to replace advice given to you by your health care provider. Make sure you discuss any questions you have with your health care provider. °Document Released: 11/28/2005 Document Revised: 10/17/2016 Document Reviewed: 10/17/2016 °Elsevier Interactive Patient Education © 2017 Elsevier Inc. ° °

## 2017-11-17 ENCOUNTER — Encounter (HOSPITAL_COMMUNITY): Payer: Self-pay | Admitting: Emergency Medicine

## 2017-11-17 ENCOUNTER — Observation Stay (HOSPITAL_COMMUNITY)
Admission: AD | Admit: 2017-11-17 | Discharge: 2017-11-18 | Disposition: A | Discharge status: Home / Self Care | Payer: Medicaid Other | Source: Ambulatory Visit | Attending: Obstetrics & Gynecology | Admitting: Obstetrics & Gynecology

## 2017-11-17 ENCOUNTER — Other Ambulatory Visit: Payer: Self-pay

## 2017-11-17 DIAGNOSIS — O09523 Supervision of elderly multigravida, third trimester: Secondary | ICD-10-CM | POA: Diagnosis not present

## 2017-11-17 DIAGNOSIS — Z79899 Other long term (current) drug therapy: Secondary | ICD-10-CM | POA: Diagnosis not present

## 2017-11-17 DIAGNOSIS — Z3A32 32 weeks gestation of pregnancy: Secondary | ICD-10-CM | POA: Insufficient documentation

## 2017-11-17 DIAGNOSIS — Z87891 Personal history of nicotine dependence: Secondary | ICD-10-CM | POA: Insufficient documentation

## 2017-11-17 DIAGNOSIS — O99283 Endocrine, nutritional and metabolic diseases complicating pregnancy, third trimester: Secondary | ICD-10-CM | POA: Diagnosis not present

## 2017-11-17 DIAGNOSIS — O23593 Infection of other part of genital tract in pregnancy, third trimester: Secondary | ICD-10-CM | POA: Diagnosis not present

## 2017-11-17 DIAGNOSIS — E039 Hypothyroidism, unspecified: Secondary | ICD-10-CM | POA: Diagnosis not present

## 2017-11-17 DIAGNOSIS — M79605 Pain in left leg: Secondary | ICD-10-CM

## 2017-11-17 LAB — CBC
HCT: 34.9 % — ABNORMAL LOW (ref 36.0–46.0)
Hemoglobin: 11.3 g/dL — ABNORMAL LOW (ref 12.0–15.0)
MCH: 27.7 pg (ref 26.0–34.0)
MCHC: 32.4 g/dL (ref 30.0–36.0)
MCV: 85.5 fL (ref 78.0–100.0)
PLATELETS: 190 10*3/uL (ref 150–400)
RBC: 4.08 MIL/uL (ref 3.87–5.11)
RDW: 15.5 % (ref 11.5–15.5)
WBC: 10.5 10*3/uL (ref 4.0–10.5)

## 2017-11-17 LAB — RPR: RPR Ser Ql: NONREACTIVE

## 2017-11-17 LAB — GC/CHLAMYDIA PROBE AMP (~~LOC~~) NOT AT ARMC
CHLAMYDIA, DNA PROBE: NEGATIVE
Neisseria Gonorrhea: NEGATIVE

## 2017-11-17 LAB — URINALYSIS, ROUTINE W REFLEX MICROSCOPIC
Bilirubin Urine: NEGATIVE
GLUCOSE, UA: 50 mg/dL — AB
Hgb urine dipstick: NEGATIVE
Ketones, ur: 5 mg/dL — AB
LEUKOCYTES UA: NEGATIVE
NITRITE: NEGATIVE
PH: 6 (ref 5.0–8.0)
Protein, ur: NEGATIVE mg/dL
Specific Gravity, Urine: 1.01 (ref 1.005–1.030)

## 2017-11-17 LAB — WET PREP, GENITAL
CLUE CELLS WET PREP: NONE SEEN
Sperm: NONE SEEN
TRICH WET PREP: NONE SEEN
Yeast Wet Prep HPF POC: NONE SEEN

## 2017-11-17 LAB — FETAL FIBRONECTIN: Fetal Fibronectin: POSITIVE — AB

## 2017-11-17 LAB — TYPE AND SCREEN
ABO/RH(D): O POS
Antibody Screen: NEGATIVE

## 2017-11-17 MED ORDER — BETAMETHASONE SOD PHOS & ACET 6 (3-3) MG/ML IJ SUSP: 12.0000 mg | Freq: Once | INTRAMUSCULAR | Status: DC

## 2017-11-17 MED ORDER — LACTATED RINGERS IV SOLN
INTRAVENOUS | Status: DC
  Administered 2017-11-17 – 2017-11-18 (×4): via INTRAVENOUS

## 2017-11-17 MED ORDER — ACETAMINOPHEN 325 MG PO TABS
650.0000 mg | ORAL_TABLET | ORAL | Status: DC | PRN
  Administered 2017-11-17: 650 mg via ORAL
  Filled 2017-11-17: qty 2

## 2017-11-17 MED ORDER — DOCUSATE SODIUM 100 MG PO CAPS
100.0000 mg | ORAL_CAPSULE | Freq: Every day | ORAL | Status: DC
  Administered 2017-11-17 – 2017-11-18 (×2): 100 mg via ORAL
  Filled 2017-11-17 (×3): qty 1

## 2017-11-17 MED ORDER — CALCIUM CARBONATE ANTACID 500 MG PO CHEW: 2.0000 | CHEWABLE_TABLET | ORAL | Status: DC | PRN

## 2017-11-17 MED ORDER — NIFEDIPINE 10 MG PO CAPS
10.0000 mg | ORAL_CAPSULE | ORAL | Status: AC | PRN
  Administered 2017-11-17 (×4): 10 mg via ORAL
  Filled 2017-11-17 (×4): qty 1

## 2017-11-17 MED ORDER — NIFEDIPINE 10 MG PO CAPS
10.0000 mg | ORAL_CAPSULE | Freq: Four times a day (QID) | ORAL | Status: DC
  Administered 2017-11-17 – 2017-11-18 (×5): 10 mg via ORAL
  Filled 2017-11-17 (×5): qty 1

## 2017-11-17 MED ORDER — NIFEDIPINE 10 MG PO CAPS
10.0000 mg | ORAL_CAPSULE | ORAL | Status: AC
  Administered 2017-11-17: 10 mg via ORAL
  Filled 2017-11-17: qty 1

## 2017-11-17 MED ORDER — ZOLPIDEM TARTRATE 5 MG PO TABS: 5.0000 mg | ORAL_TABLET | Freq: Every evening | ORAL | Status: DC | PRN

## 2017-11-17 MED ORDER — LEVOTHYROXINE SODIUM 200 MCG PO TABS
200.0000 ug | ORAL_TABLET | Freq: Every day | ORAL | Status: DC
  Administered 2017-11-17 – 2017-11-18 (×2): 200 ug via ORAL
  Filled 2017-11-17 (×3): qty 1

## 2017-11-17 MED ORDER — BETAMETHASONE SOD PHOS & ACET 6 (3-3) MG/ML IJ SUSP
12.0000 mg | Freq: Once | INTRAMUSCULAR | Status: AC
  Administered 2017-11-18: 12 mg via INTRAMUSCULAR
  Filled 2017-11-17: qty 2

## 2017-11-17 MED ORDER — BETAMETHASONE SOD PHOS & ACET 6 (3-3) MG/ML IJ SUSP
12.0000 mg | Freq: Once | INTRAMUSCULAR | Status: AC
  Administered 2017-11-17: 12 mg via INTRAMUSCULAR
  Filled 2017-11-17: qty 2

## 2017-11-17 MED ORDER — PRENATAL MULTIVITAMIN CH
1.0000 | ORAL_TABLET | Freq: Every day | ORAL | Status: DC
  Administered 2017-11-17 – 2017-11-18 (×2): 1 via ORAL
  Filled 2017-11-17 (×3): qty 1

## 2017-11-17 MED ORDER — BETAMETHASONE SOD PHOS & ACET 6 (3-3) MG/ML IJ SUSP
12.0000 mg | INTRAMUSCULAR | Status: DC
Start: 2017-11-17 — End: 2017-11-17

## 2017-11-17 NOTE — MAU Provider Note (Signed)
History    Sharon Benson is a [id number] at 32.3wks who presents, after phone call, for contractions.  Patient admits to contractions for several days now, but states contractions now becoming uncomfortable and regular.  Patient denies LOF, VB, and change in discharge.  Patient endorses fetal movement and denies other issues.   Patient Active Problem List   Diagnosis Date Noted  . Asthma affecting pregnancy, antepartum 08/03/2017  . SOB (shortness of breath) 08/03/2017  . Feeling of chest tightness 08/03/2017  . Fibroids 06/03/2017  . AMA (advanced maternal age) multigravida 35+ 06/03/2017  . 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 Morbid obesity with BMI of 45.0-49.9, adult ([place]) 06/03/2017  . Palpitations 11/09/2015  . Thyroid mass 01/02/2015  . GOITER, UNSPECIFIED 07/20/2010  . MIGRAINE HEADACHE 07/20/2010  . SEIZURE DISORDER, HX OF 07/20/2010    Chief Complaint  Patient presents with  . Contractions   HPI  OB History    Gravida Para Term Preterm AB Living   5 1 1   3 1    SAB TAB Ectopic Multiple Live Births   3       1      Past Medical History:  Diagnosis Date  . Anxiety    panic attack - not recent 2001ish  . Arthritis   . Endometriosis   . Fibroid   . GERD (gastroesophageal reflux disease)   . Headache(784.0)   . Hypothyroidism   . Palpitations   . PONV (postoperative nausea and vomiting)    last 2- no vomiting-  Seizures- not with last 2.  . Seizures ([place])    last seizure 04/2017  . SVD (spontaneous vaginal delivery)    x 1    Past Surgical History:  Procedure Laterality Date  . BREAST REDUCTION SURGERY    . BREAST SURGERY  1997  . DILATION AND CURETTAGE OF UTERUS    . DILATION AND EVACUATION N/A 11/25/2013   Procedure: DILATATION AND EVACUATION;  Surgeon: Woodroe Mode, MD;  Location: [facility];  Service: Gynecology;  Laterality: N/A;  . DILATION AND EVACUATION N/A 11/30/2013   Procedure: DILATATION AND EVACUATION;  Surgeon: Jonnie Kind, MD;  Location: [facility];  Service: Gynecology;  Laterality: N/A;  . DILATION AND EVACUATION N/A 05/02/2014   Procedure: DILATATION AND EVACUATION;  Surgeon: Ena Dawley, MD;  Location: [facility];  Service: Gynecology;  Laterality: N/A;  . GANGLION CYST EXCISION  1999   right hand  . THYROIDECTOMY  01/02/2015  . THYROIDECTOMY Bilateral 01/02/2015   Procedure: TOTAL THYROIDECTOMY;  Surgeon: Melissa Montane, MD;  Location: [facility];  Service: ENT;  Laterality: Bilateral;    Family History  Problem Relation Age of Onset  . Lung cancer Maternal Grandmother   . Colon cancer Maternal Grandmother   . Cancer Other   . Diabetes Other   . High blood pressure Other   . Diabetes Mother   . Hypertension Father   . Hypertension Paternal Grandmother   . Diabetes Paternal Grandmother   . Heart disease Paternal Grandmother     Social History   Tobacco Use  . Smoking status: Former Smoker    Years: 2.00    Types: Cigarettes    Last attempt to quit: 04/28/1997    Years since quitting: 20.5  . Smokeless tobacco: Never Used  . Tobacco comment: smoked in HS  Substance Use Topics  . Alcohol use: No  . Drug use: No    Comment: 18 yrs ago  Allergies:  Allergies  Allergen Reactions  . Other Anaphylaxis and Itching    Cantaloupe  Pt has Vertigo  . Cocoa Butter Hives  . Codeine Other (See Comments)    seizures    Medications Prior to Admission  Medication Sig Dispense Refill Last Dose  . albuterol (PROVENTIL HFA;VENTOLIN HFA) 108 (90 Base) MCG/ACT inhaler Inhale 2 puffs into the lungs every 4 (four) hours as needed for wheezing or shortness of breath. 1 Inhaler 3 08/03/2017 at 1100  . Cholecalciferol (VITAMIN D-3) 5000 UNITS TABS Take 10,000 Units by mouth every evening.    12/08/2016 at Unknown time  . folic acid (FOLVITE) 1 MG tablet Take 4 mg by mouth daily.   12/08/2016 at Unknown time  . Prenatal Vit-Fe Fumarate-FA (MULTIVITAMIN-PRENATAL) 27-0.8 MG TABS tablet Take 1 tablet by mouth  every evening.   12/08/2016 at Unknown time  . Spacer/Aero-Holding Chambers (AEROCHAMBER PLUS WITH MASK) inhaler Use as instructed 1 each 2 08/03/2017 at 1100  . Thyroid (NATURE-THROID) 97.5 MG TABS Take 1 tablet by mouth 2 (two) times daily.   12/09/2016 at Unknown time    ROS  See HPI Above Physical Exam   Blood pressure 119/75, pulse (!) 109, temperature 98.2 F (36.8 C), temperature source Oral, resp. rate 18, height 5\' 4"  (1.626 m), weight 133.8 kg (295 lb), last menstrual period 04/04/2017, SpO2 99 %.  Results for orders placed or performed during the hospital encounter of 11/17/17 (from the past 24 hour(s))  Urinalysis, Routine w reflex microscopic     Status: Abnormal   Collection Time: 11/17/17 12:56 AM  Result Value Ref Range   Color, Urine YELLOW YELLOW   APPearance CLEAR CLEAR   Specific Gravity, Urine 1.010 1.005 - 1.030   pH 6.0 5.0 - 8.0   Glucose, UA 50 (A) NEGATIVE mg/dL   Hgb urine dipstick NEGATIVE NEGATIVE   Bilirubin Urine NEGATIVE NEGATIVE   Ketones, ur 5 (A) NEGATIVE mg/dL   Protein, ur NEGATIVE NEGATIVE mg/dL   Nitrite NEGATIVE NEGATIVE   Leukocytes, UA NEGATIVE NEGATIVE    Physical Exam  Constitutional: She is oriented to person, place, and time.  Obese  HENT:  Head: Normocephalic and atraumatic.  Eyes: Conjunctivae are normal.  Neck: Normal range of motion.  Cardiovascular: Normal rate, regular rhythm and normal heart sounds.  Respiratory: Effort normal and breath sounds normal.  GI: Soft. Bowel sounds are normal.  Genitourinary: Cervix exhibits friability. Vaginal discharge found.  Genitourinary Comments: Sterile Speculum Exam: -Vaginal Vault: Moderate amt thick white discharge -wet prep collected -Cervix: Unable to visual complete cervix d/t posterior position.   -GC/CT and fFN collected -Bimanual Exam: 1/Thick/Ballotable  Musculoskeletal: Normal range of motion.  Neurological: She is alert and oriented to person, place, and time.  Skin: Skin  is warm and dry.  Psychiatric: She has a normal mood and affect. Her behavior is normal.     FHR:125 bpm, Mod Var, -Decels, +Accels UC: Q1-48min, palpates mild ED Course  Assessment: IUP at 32.3wks Cat I FT Contractions PTL  Plan: -PE as above -Labs: UA, Wet Prep, Gc/Ct, fFN -Discussed PE findings and need for intervention -Give BMZ now -Start procardia per PTL protocol -Discussed need for pelvic and bed rest until at least 36 weeks -Dr. Valarie Merino paged for consult -Await results  Follow Up (0330) -Dr. Valarie Merino called for consult and advised admission with MgSO4 infusion if contractions continue -Patient to receive 4th dose of procardia then go to antepartum   Maryann Conners CNM, MSN  11/17/2017 2:12 AM

## 2017-11-17 NOTE — MAU Note (Signed)
Pt reports contractions all day, but since 10:30pm they have been 4-6 minutes apart. Pt denies LOF or vaginal bleeding. Reports good fetal movement.

## 2017-11-17 NOTE — H&P (Signed)
Sharon Benson is a 41 y.o. female, G5P1031 at 32.3 weeks, presenting for contractions.  Patient Active Problem List   Diagnosis Date Noted  . Asthma affecting pregnancy, antepartum 08/03/2017  . SOB (shortness of breath) 08/03/2017  . Feeling of chest tightness 08/03/2017  . Fibroids 06/03/2017  . AMA (advanced maternal age) multigravida 35+ 06/03/2017  . 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 Morbid obesity with BMI of 45.0-49.9, adult ([place]) 06/03/2017  . Palpitations 11/09/2015  . Thyroid mass 01/02/2015  . GOITER, UNSPECIFIED 07/20/2010  . MIGRAINE HEADACHE 07/20/2010  . SEIZURE DISORDER, HX OF 07/20/2010    History of present pregnancy: Patient entered care at 7 weeks.   EDC of 01/09/18 was established by Definite LMP on 04/08/17.   Anatomy scan:  COMMENT: SINGLETON PREGNANCY. BREECH PRESENTATION. ANTERIOR PLACENTA, PLACENTAL EDGE MEASURES 6.7CM FROM INTERNAL OS. CERVIX APPEARS CLOSED AND MEASURES 4.9CM TRANSABDOMINALLY. AMNIOTIC FLUID APPEARS NORMAL  Additional [name] evaluations:  29 wks: EFW 3LBS, 48%ILE, AFI 18.85, 70%ILE, VTX, CERVIX 4.14,    OB History    Gravida Para Term Preterm AB Living   5 1 1   3 1    SAB TAB Ectopic Multiple Live Births   3       1     Past Medical History:  Diagnosis Date  . Anxiety    panic attack - not recent 2001ish  . Arthritis   . Endometriosis   . Fibroid   . GERD (gastroesophageal reflux disease)   . Headache(784.0)   . Hypothyroidism   . Palpitations   . PONV (postoperative nausea and vomiting)    last 2- no vomiting-  Seizures- not with last 2.  . Seizures ([place])    last seizure 04/2017  . SVD (spontaneous vaginal delivery)    x 1   Past Surgical History:  Procedure Laterality Date  . BREAST REDUCTION SURGERY    . BREAST SURGERY  1997  . DILATION AND CURETTAGE OF UTERUS    . DILATION AND EVACUATION N/A 11/25/2013   Procedure: DILATATION AND EVACUATION;  Surgeon: Woodroe Mode, MD;  Location: [facility];   Service: Gynecology;  Laterality: N/A;  . DILATION AND EVACUATION N/A 11/30/2013   Procedure: DILATATION AND EVACUATION;  Surgeon: Jonnie Kind, MD;  Location: [facility];  Service: Gynecology;  Laterality: N/A;  . DILATION AND EVACUATION N/A 05/02/2014   Procedure: DILATATION AND EVACUATION;  Surgeon: Ena Dawley, MD;  Location: [facility];  Service: Gynecology;  Laterality: N/A;  . GANGLION CYST EXCISION  1999   right hand  . THYROIDECTOMY  01/02/2015  . THYROIDECTOMY Bilateral 01/02/2015   Procedure: TOTAL THYROIDECTOMY;  Surgeon: Melissa Montane, MD;  Location: [facility];  Service: ENT;  Laterality: Bilateral;   Family History: family history includes Cancer in her other; Colon cancer in her maternal grandmother; Diabetes in her mother, other, and paternal grandmother; Heart disease in her paternal grandmother; High blood pressure in her other; Hypertension in her father and paternal grandmother; Lung cancer in her maternal grandmother. Social History:  reports that she quit smoking about 20 years ago. Her smoking use included cigarettes. She quit after 2.00 years of use. she has never used smokeless tobacco. She reports that she does not drink alcohol or use drugs.   Prenatal Transfer Tool  Maternal Diabetes: No Genetic Screening: Normal Maternal Ultrasounds/Referrals: Normal Fetal Ultrasounds or other Referrals:  Fetal echo Maternal Substance Abuse:  No Significant Maternal Medications:  None Significant Maternal Lab Results: Lab values  include: Other:     ROS:  +ctx, -LoF, +Fm, -Vb  Allergies  Allergen Reactions  . Other Anaphylaxis and Itching    Cantaloupe  Pt has Vertigo  . Cocoa Butter Hives  . Codeine Other (See Comments)    seizures       Blood pressure 111/64, pulse 98, temperature 98.2 F (36.8 C), temperature source Oral, resp. rate 18, height 5\' 4"  (1.626 m), weight 133.8 kg (295 lb), last menstrual period 04/04/2017, SpO2 97 %.  Physical Exam  Constitutional: She is  oriented to person, place, and time. She appears well-developed and well-nourished.  Obese  HENT:  Head: Normocephalic and atraumatic.  Eyes: Conjunctivae are normal.  Neck: Normal range of motion.  Cardiovascular: Normal rate, regular rhythm and normal heart sounds.  Respiratory: Effort normal and breath sounds normal.  GI: Soft. Bowel sounds are normal.  Musculoskeletal: Normal range of motion.  Neurological: She is alert and oriented to person, place, and time.  Skin: Skin is warm and dry.  Psychiatric: She has a normal mood and affect. Her behavior is normal.    FHR: 125 bpm, Mod Var, -Decels, +Accels UCs:  Q84min, palpates mild  Prenatal labs: ABO, Rh:  O Positive Antibody:  Negative Rubella:  Immune RPR:   NR HBsAg:   Negative HIV:   NR GBS:  Pending Sickle cell/Hgb electrophoresis:  Normal Pap:  Normal GC:  ND Chlamydia:  ND Other:  TSH 40.79    Assessment IUP at 32.3wks Cat I FT PTL Obesity AMA Hypothyroidism   Plan: Admit to Antepartum  per consult with Dr.E. Simona Huh Routine Antepartum Orders per [facility] Guideline Consider MgSO4 if patient contractions not resolved with procardia Procardia Q6hrs Synthroid 235mcg daily Obtain GBS culture BMZ dosing completion on 12/8 at 0230 Dr. Laurette Schimke (NICU) contacted and informed of patient status and POC  Loann Quill, MSN 11/17/2017, 3:32 AM

## 2017-11-17 NOTE — Progress Notes (Signed)
I received a referral from pt's nurse due to pt being somewhat anxious about her preterm labor.  Pt was doing okay when I spoke with her.  Her contractions had slowed down considerably and she was hopeful that she will be able to stay pregnant for a few more weeks.  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  We spoke about her faith and how that is helping her with this, as well as her supportive family.  Please page as needs arise.  [place], Bcc Pager, [phone] 2:57 PM

## 2017-11-17 NOTE — Progress Notes (Addendum)
Hospital day # 1 pregnancy at [redacted]w[redacted]d  S: well, reports good fetal activity      Contractions:irregular,       Vaginal bleeding:none        Vaginal discharge: no significant change  O: BP 108/69 (BP Location: Right Arm)   Pulse (!) 131   Temp 98 F (36.7 C) (Oral)   Resp 20   Ht 5\' 4"  (1.626 m)   Wt 295 lb (133.8 kg)   LMP 04/04/2017   SpO2 99%   BMI 50.64 kg/m       Fetal tracings:reviewed and reassuring      Uterus consistent with 32+3 weeks and non-tender      Extremities: extremities normal, atraumatic, no cyanosis or edema and no significant edema and no signs of DVT  A: [redacted]w[redacted]d with preterm contractions     stable  P: continue current plan of care      10 mg Procardia now      2nd dose of Betamethasone due at [place] 11/17/2017 11:59 AM   I saw and examined patient at bedside and agree with above findings, assessment and plan.  TOCO at 1655:  2 contractions in 1 hr and some irritabilty.   Will continue to monitor. Fetal kick counts discussed.  Dr. Alesia Richards.

## 2017-11-18 ENCOUNTER — Observation Stay (HOSPITAL_BASED_OUTPATIENT_CLINIC_OR_DEPARTMENT_OTHER): Payer: Medicaid Other

## 2017-11-18 DIAGNOSIS — M79609 Pain in unspecified limb: Secondary | ICD-10-CM | POA: Diagnosis not present

## 2017-11-18 MED ORDER — NIFEDIPINE 10 MG PO CAPS: 10.0000 mg | ORAL_CAPSULE | Freq: Four times a day (QID) | ORAL | 0 refills | Status: DC

## 2017-11-18 NOTE — Progress Notes (Signed)
VASCULAR LAB PRELIMINARY  PRELIMINARY  PRELIMINARY  PRELIMINARY  Left lower extremity venous duplex completed.    Preliminary report:  There is no DVT or SVT noted in the left lower extremity.   Anysia Choi, RVT 11/18/2017, 2:34 PM

## 2017-11-18 NOTE — Discharge Instructions (Signed)
ANTENATAL DISCHARGE INSTRUCTIONS   Date: 11/18/2017   Time: 3:01 PM   Preterm labor  A. MEDICATION PRESCRIBED FOR HOME:  Procardia 10 mg q 6 hours for preterm contractions  Fill all the prescriptions ordered by your doctor and be sure to take the   entire dose as directed. Any time you go for emergency treatment, bring   your medicine.  B. WOUND CARE:   C. DIET AT HOME: NORMAL  D. ACTIVITY: pelvic rest   Preterm Labor and Birth Information The normal length of a pregnancy is 39-41 weeks. Preterm labor is when labor starts before 37 completed weeks of pregnancy. What are the risk factors for preterm labor? Preterm labor is more likely to occur in women who:  Have certain infections during pregnancy such as a bladder infection, sexually transmitted infection, or infection inside the uterus (chorioamnionitis).  Have a shorter-than-normal cervix.  Have gone into preterm labor before.  Have had surgery on their cervix.  Are younger than age 53 or older than age 59.  Are African American.  Are pregnant with twins or multiple babies (multiple gestation).  Take street drugs or smoke while pregnant.  Do not gain enough weight while pregnant.  Became pregnant shortly after having been pregnant.  What are the symptoms of preterm labor? Symptoms of preterm labor include:  Cramps similar to those that can happen during a menstrual period. The cramps may happen with diarrhea.  Pain in the abdomen or lower back.  Regular uterine contractions that may feel like tightening of the abdomen.  A feeling of increased pressure in the pelvis.  Increased watery or bloody mucus discharge from the vagina.  Water breaking (ruptured amniotic sac).  Why is it important to recognize signs of preterm labor? It is important to recognize signs of preterm labor because babies who are born prematurely may not be fully developed. This can put them at an increased risk for:  Long-term  (chronic) heart and lung problems.  Difficulty immediately after birth with regulating body systems, including blood sugar, body temperature, heart rate, and breathing rate.  Bleeding in the brain.  Cerebral palsy.  Learning difficulties.  Death.  These risks are highest for babies who are born before 29 weeks of pregnancy. How is preterm labor treated? Treatment depends on the length of your pregnancy, your condition, and the health of your baby. It may involve:  Having a stitch (suture) placed in your cervix to prevent your cervix from opening too early (cerclage).  Taking or being given medicines, such as: ? Hormone medicines. These may be given early in pregnancy to help support the pregnancy. ? Medicine to stop contractions. ? Medicines to help mature the babys lungs. These may be prescribed if the risk of delivery is high. ? Medicines to prevent your baby from developing cerebral palsy.  If the labor happens before 34 weeks of pregnancy, you may need to stay in the hospital. What should I do if I think I am in preterm labor? If you think that you are going into preterm labor, call your health care provider right away. How can I prevent preterm labor in future pregnancies? To increase your chance of having a full-term pregnancy:  Do not use any tobacco products, such as cigarettes, chewing tobacco, and e-cigarettes. If you need help quitting, ask your health care provider.  Do not use street drugs or medicines that have not been prescribed to you during your pregnancy.  Talk with your health care provider before  taking any herbal supplements, even if you have been taking them regularly.  Make sure you gain a healthy amount of weight during your pregnancy.  Watch for infection. If you think that you might have an infection, get it checked right away.  Make sure to tell your health care provider if you have gone into preterm labor before.  This information is not  intended to replace advice given to you by your health care provider. Make sure you discuss any questions you have with your health care provider. Document Released: 02/18/2004 Document Revised: 05/10/2016 Document Reviewed: 04/20/2016 Elsevier Interactive Patient Education  2018 [employer].

## 2017-11-18 NOTE — Progress Notes (Signed)
Discharged home in stable condition via w/c. 

## 2017-11-18 NOTE — Progress Notes (Signed)
Discharge instructions given to patient and she verbalized understanding of all instructions given. Written copy of AVS given to patient. 

## 2017-11-18 NOTE — Progress Notes (Signed)
Per ultrasound tech, Doppler study of lower left leg is negative for DVT.

## 2017-11-18 NOTE — Progress Notes (Signed)
Hospital day # 2 pregnancy at [redacted]w[redacted]d  S: well, reports good fetal activity      Contractions:none, irregular      Vaginal bleeding:none now       Vaginal discharge: no significant change  O: BP (!) 105/56 (BP Location: Right Arm)   Pulse (!) 102   Temp 98.2 F (36.8 C) (Oral)   Resp 18   Ht 5\' 4"  (1.626 m)   Wt 295 lb (133.8 kg)   LMP 04/04/2017   SpO2 100%   BMI 50.64 kg/m       Fetal tracings:reviewed and reassuring      Uterus non-tender      Extremities: C/o left leg pain behind her knee; tenderness noted, no swelling or edema  A: [redacted]w[redacted]d with preterm contractions     R/O DVT     stable  P: continue current plan of care      Left leg doppler studies Bertram Gala Khaleb Broz  CNM 11/18/2017 11:03 AM

## 2017-11-18 NOTE — Discharge Summary (Signed)
Antenatal Physician Discharge Summary  Patient ID: Sharon Benson MRN: [id number] DOB/AGE: January 22, 1976 41 y.o.  Admit date: 11/17/2017 Discharge date: 11/18/2017  Admission Diagnoses: Preterm Contractions  Discharge Diagnoses: Same  Prenatal Procedures: none  Intrapartum Procedures: Neonatology, Maternal Fetal Medicine none  Significant Diagnostic Studies:  Results for orders placed or performed during the hospital encounter of 11/17/17 (from the past 168 hour(s))  GC/Chlamydia probe amp ([place])not at [facility]   Collection Time: 11/17/17 12:00 AM  Result Value Ref Range   Chlamydia Negative    Neisseria gonorrhea Negative   Urinalysis, Routine w reflex microscopic   Collection Time: 11/17/17 12:56 AM  Result Value Ref Range   Color, Urine YELLOW YELLOW   APPearance CLEAR CLEAR   Specific Gravity, Urine 1.010 1.005 - 1.030   pH 6.0 5.0 - 8.0   Glucose, UA 50 (A) NEGATIVE mg/dL   Hgb urine dipstick NEGATIVE NEGATIVE   Bilirubin Urine NEGATIVE NEGATIVE   Ketones, ur 5 (A) NEGATIVE mg/dL   Protein, ur NEGATIVE NEGATIVE mg/dL   Nitrite NEGATIVE NEGATIVE   Leukocytes, UA NEGATIVE NEGATIVE  Wet prep, genital   Collection Time: 11/17/17  2:07 AM  Result Value Ref Range   Yeast Wet Prep HPF POC NONE SEEN NONE SEEN   Trich, Wet Prep NONE SEEN NONE SEEN   Clue Cells Wet Prep HPF POC NONE SEEN NONE SEEN   WBC, Wet Prep HPF POC MODERATE (A) NONE SEEN   Sperm NONE SEEN   Fetal fibronectin   Collection Time: 11/17/17  2:07 AM  Result Value Ref Range   Fetal Fibronectin POSITIVE (A) NEGATIVE  RPR   Collection Time: 11/17/17  3:47 AM  Result Value Ref Range   RPR Ser Ql Non Reactive Non Reactive  CBC on admission   Collection Time: 11/17/17  3:47 AM  Result Value Ref Range   WBC 10.5 4.0 - 10.5 K/uL   RBC 4.08 3.87 - 5.11 MIL/uL   Hemoglobin 11.3 (L) 12.0 - 15.0 g/dL   HCT 34.9 (L) 36.0 - 46.0 %   MCV 85.5 78.0 - 100.0 fL   MCH 27.7 26.0 - 34.0 pg   MCHC 32.4 30.0 - 36.0  g/dL   RDW 15.5 11.5 - 15.5 %   Platelets 190 150 - 400 K/uL  Type and screen [place]   Collection Time: 11/17/17  3:47 AM  Result Value Ref Range   ABO/RH(D) O POS    Antibody Screen NEG    Sample Expiration 11/20/2017   Culture, beta strep (group b only)   Collection Time: 11/17/17  9:43 AM  Result Value Ref Range   Specimen Description VAGINAL/RECTAL    Special Requests NONE    Culture      CULTURE REINCUBATED FOR BETTER GROWTH Performed at [facility], [address], [place], [place] [address]    Report Status PENDING   Results for orders placed or performed during the hospital encounter of 11/11/17 (from the past 168 hour(s))  Urinalysis, Routine w reflex microscopic   Collection Time: 11/11/17  6:35 PM  Result Value Ref Range   Color, Urine YELLOW YELLOW   APPearance HAZY (A) CLEAR   Specific Gravity, Urine 1.020 1.005 - 1.030   pH 5.0 5.0 - 8.0   Glucose, UA 150 (A) NEGATIVE mg/dL   Hgb urine dipstick NEGATIVE NEGATIVE   Bilirubin Urine NEGATIVE NEGATIVE   Ketones, ur NEGATIVE NEGATIVE mg/dL   Protein, ur NEGATIVE NEGATIVE mg/dL  Nitrite NEGATIVE NEGATIVE   Leukocytes, UA NEGATIVE NEGATIVE   Left Leg Venous doppler study to R/O DVT was negative  Treatments: }  Hospital Course:  This is a 41 y.o. [id number] with IUP at [redacted]w[redacted]d admitted for preterm contractions She was admitted with contractions, noted to have a cervical exam of 1cm.  No leaking of fluid and no bleeding.  She initially received 4 doses of Procardia and her contractions resolved. She continued Procardia 10mg  q 6 hours and also received betamethasone x 2 doses.   She was observed, fetal heart rate monitoring remained reassuring, and she had no signs/symptoms of progressing preterm labor or other maternal-fetal concerns.   She was deemed stable for discharge to home with outpatient follow up. On day of discharge she complained of pain behind her left knee which with doppler  ultrasound , DVT was ruled out.  Discharge Exam: BP (!) 101/57 (BP Location: Right Arm)   Pulse (!) 102   Temp 98.1 F (36.7 C) (Oral)   Resp 18   Ht 5\' 4"  (1.626 m)   Wt 295 lb (133.8 kg)   LMP 04/04/2017   SpO2 98%   BMI 50.64 kg/m  General: Alert oriented x3 Heart- RRR Chest- Clear x 4 Abd- Non tender, gravid Extremities- Slight tenderness in left calf  Discharge Condition: Stable  Disposition: 01-Home or Self Care  Discharge Instructions    Discharge activity:  Bathroom / Shower only   Complete by:  As directed    Discharge activity:  Up to eat   Complete by:  As directed    Discharge diet:  No restrictions   Complete by:  As directed    Do not have sex or do anything that might make you have an orgasm   Complete by:  As directed    Fetal Kick Count:  Lie on our left side for one hour after a meal, and count the number of times your baby kicks.  If it is less than 5 times, get up, move around and drink some juice.  Repeat the test 30 minutes later.  If it is still less than 5 kicks in an hour, notify your doctor.   Complete by:  As directed    No sexual activity restrictions   Complete by:  As directed    Notify physician for a general feeling that "something is not right"   Complete by:  As directed    Notify physician for increase or change in vaginal discharge   Complete by:  As directed    Notify physician for intestinal cramps, with or without diarrhea, sometimes described as "gas pain"   Complete by:  As directed    Notify physician for leaking of fluid   Complete by:  As directed    Notify physician for low, dull backache, unrelieved by heat or Tylenol   Complete by:  As directed    Notify physician for menstrual like cramps   Complete by:  As directed    Notify physician for pelvic pressure   Complete by:  As directed    Notify physician for uterine contractions.  These may be painless and feel like the uterus is tightening or the baby is  "balling up"    Complete by:  As directed    Notify physician for vaginal bleeding   Complete by:  As directed    PRETERM LABOR:  Includes any of the follwing symptoms that occur between 20 - [redacted] weeks gestation.  If these  symptoms are not stopped, preterm labor can result in preterm delivery, placing your baby at risk   Complete by:  As directed      Allergies as of 11/18/2017      Reactions   Other Anaphylaxis, Itching   Cantaloupe Pt has Vertigo   Cocoa Butter Hives   Codeine Other (See Comments)   seizures      Medication List    TAKE these medications   aerochamber plus with mask inhaler Use as instructed   albuterol 108 (90 Base) MCG/ACT inhaler Commonly known as:  PROVENTIL HFA;VENTOLIN HFA Inhale 2 puffs into the lungs every 4 (four) hours as needed for wheezing or shortness of breath.   folic acid 1 MG tablet Commonly known as:  FOLVITE Take 4 mg by mouth daily.   multivitamin-prenatal 27-0.8 MG Tabs tablet Take 1 tablet by mouth every evening.   NATURE-THROID 97.5 MG Tabs Generic drug:  Thyroid Take 1 tablet by mouth 2 (two) times daily.   Vitamin D-3 5000 units Tabs Take 10,000 Units by mouth every evening.     Procardia 10mg  every 6 hours for preterm contractions [facility]. Schedule an appointment as soon as possible for a visit in 3 day(s).   Specialty:  Obstetrics and Gynecology Why:  Next week for follow up Contact information: [address]. [address] [id number] [phone]          Signed: Yvonne Kendall, CNM  3:05 PM

## 2017-11-19 LAB — CULTURE, BETA STREP (GROUP B ONLY)

## 2017-12-11 ENCOUNTER — Other Ambulatory Visit: Payer: Medicaid Other

## 2017-12-11 ENCOUNTER — Encounter: Payer: Medicaid Other | Admitting: Genetic Counselor

## 2017-12-12 NOTE — L&D Delivery Note (Signed)
Delivery Note At  a viable female was delivered via  (Presentation:; oa ).  APGAR:9 ,9 ; weight 7.3   Placenta status: complete, . #V Cord:  with the following complications:nuchal cord one loose .    Anesthesia:  1% lidocaine Episiotomy:  None Lacerations:  1st Suture Repair: 3.0 chromic Est. Blood Loss (mL):  150cc  Mom to postpartum.  Baby to Couplet care / Skin to Skin.  Pleas Koch Jamyra Zweig 01/05/2018, 10:39 PM

## 2017-12-13 LAB — OB RESULTS CONSOLE GBS: STREP GROUP B AG: NEGATIVE

## 2017-12-18 ENCOUNTER — Encounter (HOSPITAL_COMMUNITY): Payer: Self-pay | Admitting: *Deleted

## 2017-12-18 ENCOUNTER — Inpatient Hospital Stay (HOSPITAL_COMMUNITY)
Admission: AD | Admit: 2017-12-18 | Discharge: 2017-12-18 | Disposition: A | Discharge status: Home / Self Care | Payer: Medicaid Other | Source: Ambulatory Visit | Attending: Obstetrics & Gynecology | Admitting: Obstetrics & Gynecology

## 2017-12-18 DIAGNOSIS — R5383 Other fatigue: Secondary | ICD-10-CM | POA: Diagnosis present

## 2017-12-18 DIAGNOSIS — O26813 Pregnancy related exhaustion and fatigue, third trimester: Secondary | ICD-10-CM | POA: Diagnosis not present

## 2017-12-18 DIAGNOSIS — E039 Hypothyroidism, unspecified: Secondary | ICD-10-CM | POA: Diagnosis not present

## 2017-12-18 DIAGNOSIS — O99283 Endocrine, nutritional and metabolic diseases complicating pregnancy, third trimester: Secondary | ICD-10-CM | POA: Diagnosis not present

## 2017-12-18 DIAGNOSIS — Z87891 Personal history of nicotine dependence: Secondary | ICD-10-CM | POA: Diagnosis not present

## 2017-12-18 DIAGNOSIS — O99613 Diseases of the digestive system complicating pregnancy, third trimester: Secondary | ICD-10-CM | POA: Insufficient documentation

## 2017-12-18 DIAGNOSIS — Z3A36 36 weeks gestation of pregnancy: Secondary | ICD-10-CM | POA: Diagnosis not present

## 2017-12-18 DIAGNOSIS — K219 Gastro-esophageal reflux disease without esophagitis: Secondary | ICD-10-CM | POA: Diagnosis not present

## 2017-12-18 LAB — COMPREHENSIVE METABOLIC PANEL
ALBUMIN: 2.3 g/dL — AB (ref 3.5–5.0)
ALT: 9 U/L — ABNORMAL LOW (ref 14–54)
AST: 14 U/L — AB (ref 15–41)
Alkaline Phosphatase: 107 U/L (ref 38–126)
Anion gap: 7 (ref 5–15)
BILIRUBIN TOTAL: 0.5 mg/dL (ref 0.3–1.2)
BUN: 9 mg/dL (ref 6–20)
CO2: 23 mmol/L (ref 22–32)
Calcium: 9.2 mg/dL (ref 8.9–10.3)
Chloride: 105 mmol/L (ref 101–111)
Creatinine, Ser: 0.78 mg/dL (ref 0.44–1.00)
GFR calc Af Amer: >60 mL/min (ref >60)
GFR calc non Af Amer: >60 mL/min (ref >60)
GLUCOSE: 83 mg/dL (ref 65–99)
POTASSIUM: 4.6 mmol/L (ref 3.5–5.1)
SODIUM: 135 mmol/L (ref 135–145)
TOTAL PROTEIN: 6.4 g/dL — AB (ref 6.5–8.1)

## 2017-12-18 LAB — URINALYSIS, ROUTINE W REFLEX MICROSCOPIC
BILIRUBIN URINE: NEGATIVE
Glucose, UA: >=500 mg/dL — AB
Hgb urine dipstick: NEGATIVE
Ketones, ur: NEGATIVE mg/dL
LEUKOCYTES UA: NEGATIVE
Nitrite: NEGATIVE
PROTEIN: 30 mg/dL — AB
Specific Gravity, Urine: 1.023 (ref 1.005–1.030)
pH: 6 (ref 5.0–8.0)

## 2017-12-18 LAB — CBC
HEMATOCRIT: 33.1 % — AB (ref 36.0–46.0)
Hemoglobin: 10.7 g/dL — ABNORMAL LOW (ref 12.0–15.0)
MCH: 27 pg (ref 26.0–34.0)
MCHC: 32.3 g/dL (ref 30.0–36.0)
MCV: 83.6 fL (ref 78.0–100.0)
PLATELETS: 184 10*3/uL (ref 150–400)
RBC: 3.96 MIL/uL (ref 3.87–5.11)
RDW: 16.2 % — ABNORMAL HIGH (ref 11.5–15.5)
WBC: 7.9 10*3/uL (ref 4.0–10.5)

## 2017-12-18 LAB — PROTEIN / CREATININE RATIO, URINE
CREATININE, URINE: 221 mg/dL
PROTEIN CREATININE RATIO: 0.05 mg/mg{creat} (ref 0.00–0.15)
Total Protein, Urine: 11 mg/dL

## 2017-12-18 LAB — URIC ACID: Uric Acid, Serum: 5.2 mg/dL (ref 2.3–6.6)

## 2017-12-18 NOTE — MAU Note (Signed)
Sent from office, feels sluggish, BP elevated

## 2017-12-18 NOTE — Progress Notes (Signed)
Called Dr. Alwyn Pea about patient.  Orders received for [facility] labs.  N. Prothero, CNM to see patient when labs resulted.

## 2017-12-18 NOTE — MAU Note (Signed)
Chief Complaint:  Hypertension; feeling sluggish; and Hypothyroidism   None    HPI: Sharon Benson is a 42 y.o. [phone] at [redacted]w[redacted]d who presents to maternity admissions reporting fatigue and a high BP reading on machine at home.  Pt denies headache or blurred vision.  FM+.  Pregnancy remarkable for morbid obesity and hypothyroidism.  Pt was on modified bedrest in this pregnancy for Preterm contractions.  Location: all over fatigue Quality: mod Duration: today Associated signs and symptoms: one high BP reading at home.  Denies contractions, leakage of fluid or vaginal bleeding. Good fetal movement.   Pregnancy Course:   Past Medical History:  Diagnosis Date  . Anxiety    panic attack - not recent 2001ish  . Arthritis   . Endometriosis   . Fibroid   . GERD (gastroesophageal reflux disease)   . Headache(784.0)   . Hypothyroidism   . Palpitations   . PONV (postoperative nausea and vomiting)    last 2- no vomiting-  Seizures- not with last 2.  . Seizures ([place])    last seizure 04/2017  . Seizures ([place])   . SVD (spontaneous vaginal delivery)    x 1   OB History  Gravida Para Term Preterm AB Living  5 1 1   3 1   SAB TAB Ectopic Multiple Live Births  3       1    # Outcome Date GA Lbr Len/2nd Weight Sex Delivery Anes PTL Lv  5 Current           4 Term 03/13/99    F Vag-Spont EPI N LIV  3 SAB              Birth Comments: System Generated. Please review and update pregnancy details.  2 SAB           1 SAB              Birth Comments: System Generated. Please review and update pregnancy details.     Past Surgical History:  Procedure Laterality Date  . BREAST REDUCTION SURGERY    . BREAST SURGERY  1997  . DILATION AND CURETTAGE OF UTERUS    . DILATION AND EVACUATION N/A 11/25/2013   Procedure: DILATATION AND EVACUATION;  Surgeon: Woodroe Mode, MD;  Location: [facility];  Service: Gynecology;  Laterality: N/A;  . DILATION AND EVACUATION N/A 11/30/2013   Procedure: DILATATION  AND EVACUATION;  Surgeon: Jonnie Kind, MD;  Location: [facility];  Service: Gynecology;  Laterality: N/A;  . DILATION AND EVACUATION N/A 05/02/2014   Procedure: DILATATION AND EVACUATION;  Surgeon: Ena Dawley, MD;  Location: [facility];  Service: Gynecology;  Laterality: N/A;  . GANGLION CYST EXCISION  1999   right hand  . THYROIDECTOMY  01/02/2015  . THYROIDECTOMY Bilateral 01/02/2015   Procedure: TOTAL THYROIDECTOMY;  Surgeon: Melissa Montane, MD;  Location: [facility];  Service: ENT;  Laterality: Bilateral;   Family History  Problem Relation Age of Onset  . Lung cancer Maternal Grandmother   . Colon cancer Maternal Grandmother   . Cancer Other   . Diabetes Other   . High blood pressure Other   . Diabetes Mother   . Hypertension Father   . Hypertension Paternal Grandmother   . Diabetes Paternal Grandmother   . Heart disease Paternal Grandmother    Social History   Tobacco Use  . Smoking status: Former Smoker    Years: 2.00    Types: Cigarettes    Last attempt  to quit: 04/28/1997    Years since quitting: 20.6  . Smokeless tobacco: Never Used  . Tobacco comment: smoked in HS  Substance Use Topics  . Alcohol use: No  . Drug use: No    Comment: 18 yrs ago   Allergies  Allergen Reactions  . Other Anaphylaxis and Itching    Cantaloupe  Pt has Vertigo  . Cocoa Butter Hives  . Codeine Other (See Comments)    seizures   Medications Prior to Admission  Medication Sig Dispense Refill Last Dose  . albuterol (PROVENTIL HFA;VENTOLIN HFA) 108 (90 Base) MCG/ACT inhaler Inhale 2 puffs into the lungs every 4 (four) hours as needed for wheezing or shortness of breath. 1 Inhaler 3 08/03/2017 at 1100  . Cholecalciferol (VITAMIN D-3) 5000 UNITS TABS Take 10,000 Units by mouth every evening.    12/08/2016 at Unknown time  . folic acid (FOLVITE) 1 MG tablet Take 4 mg by mouth daily.   12/08/2016 at Unknown time  . NIFEdipine (PROCARDIA) 10 MG capsule Take 1 capsule (10 mg total) by mouth every 6  (six) hours for 4 days. 16 capsule 0   . Prenatal Vit-Fe Fumarate-FA (MULTIVITAMIN-PRENATAL) 27-0.8 MG TABS tablet Take 1 tablet by mouth every evening.   12/08/2016 at Unknown time  . Spacer/Aero-Holding Chambers (AEROCHAMBER PLUS WITH MASK) inhaler Use as instructed 1 each 2 08/03/2017 at 1100  . Thyroid (NATURE-THROID) 97.5 MG TABS Take 1 tablet by mouth 2 (two) times daily.   12/09/2016 at Unknown time    I have reviewed patient's Past Medical Hx, Surgical Hx, Family Hx, Social Hx, medications and allergies.   ROS:  Review of Systems  Constitutional: Positive for fatigue.  HENT: Negative.   Eyes: Negative.   Respiratory: Negative.   Cardiovascular: Negative.   Gastrointestinal: Negative.   Endocrine: Negative.   Genitourinary: Negative.   Musculoskeletal: Negative.   Allergic/Immunologic: Negative.   Neurological: Negative.   Hematological: Negative.   Psychiatric/Behavioral: Negative.     Physical Exam   Patient Vitals for the past 24 hrs:  BP Temp Temp src Pulse Resp SpO2 Height Weight  12/18/17 1901 121/80 - - 95 - - - -  12/18/17 1854 120/83 - - 95 - - - -  12/18/17 1827 122/70 - - (!) 101 - - - -  12/18/17 1817 127/86 98.6 F (37 C) Oral (!) 104 19 98 % 5\' 4"  (1.626 m) -  12/18/17 1754 - - - - - - - 135.1 kg (297 lb 12 oz)   Constitutional: Well-developed, well-nourished female in no acute distress.  Cardiovascular: normal rate Respiratory: normal effort GI: Abd soft, non-tender, gravid appropriate for gestational age. Pos BS x 4 MS: Extremities nontender, no edema, normal ROM Neurologic: Alert and oriented x 4.  FHT:  Baseline 125 , moderate variability, accelerations present, no decelerations Contractions: occ   Labs: Results for orders placed or performed during the hospital encounter of 12/18/17 (from the past 24 hour(s))  Protein / creatinine ratio, urine     Status: None   Collection Time: 12/18/17  5:53 PM  Result Value Ref Range   Creatinine, Urine  221.00 mg/dL   Total Protein, Urine 11 mg/dL   Protein Creatinine Ratio 0.05 0.00 - 0.15 mg/mg[Cre]  Urinalysis, Routine w reflex microscopic     Status: Abnormal   Collection Time: 12/18/17  5:53 PM  Result Value Ref Range   Color, Urine YELLOW YELLOW   APPearance HAZY (A) CLEAR   Specific Gravity, Urine 1.023  1.005 - 1.030   pH 6.0 5.0 - 8.0   Glucose, UA >=500 (A) NEGATIVE mg/dL   Hgb urine dipstick NEGATIVE NEGATIVE   Bilirubin Urine NEGATIVE NEGATIVE   Ketones, ur NEGATIVE NEGATIVE mg/dL   Protein, ur 30 (A) NEGATIVE mg/dL   Nitrite NEGATIVE NEGATIVE   Leukocytes, UA NEGATIVE NEGATIVE   RBC / HPF 0-5 0 - 5 RBC/hpf   WBC, UA 0-5 0 - 5 WBC/hpf   Bacteria, UA FEW (A) NONE SEEN   Squamous Epithelial / LPF 0-5 (A) NONE SEEN   Mucus PRESENT   CBC     Status: Abnormal   Collection Time: 12/18/17  7:14 PM  Result Value Ref Range   WBC 7.9 4.0 - 10.5 K/uL   RBC 3.96 3.87 - 5.11 MIL/uL   Hemoglobin 10.7 (L) 12.0 - 15.0 g/dL   HCT 33.1 (L) 36.0 - 46.0 %   MCV 83.6 78.0 - 100.0 fL   MCH 27.0 26.0 - 34.0 pg   MCHC 32.3 30.0 - 36.0 g/dL   RDW 16.2 (H) 11.5 - 15.5 %   Platelets 184 150 - 400 K/uL  Uric acid     Status: None   Collection Time: 12/18/17  7:14 PM  Result Value Ref Range   Uric Acid, Serum 5.2 2.3 - 6.6 mg/dL  Comprehensive metabolic panel     Status: Abnormal   Collection Time: 12/18/17  7:14 PM  Result Value Ref Range   Sodium 135 135 - 145 mmol/L   Potassium 4.6 3.5 - 5.1 mmol/L   Chloride 105 101 - 111 mmol/L   CO2 23 22 - 32 mmol/L   Glucose, Bld 83 65 - 99 mg/dL   BUN 9 6 - 20 mg/dL   Creatinine, Ser 0.78 0.44 - 1.00 mg/dL   Calcium 9.2 8.9 - 10.3 mg/dL   Total Protein 6.4 (L) 6.5 - 8.1 g/dL   Albumin 2.3 (L) 3.5 - 5.0 g/dL   AST 14 (L) 15 - 41 U/L   ALT 9 (L) 14 - 54 U/L   Alkaline Phosphatase 107 38 - 126 U/L   Total Bilirubin 0.5 0.3 - 1.2 mg/dL   GFR calc non Af Amer >60 >60 mL/min   GFR calc Af Amer >60 >60 mL/min   Anion gap 7 5 - 15     Imaging:  No results found.  MAU Course: Orders Placed This Encounter  Procedures  . Protein / creatinine ratio, urine  . CBC  . Uric acid  . Comprehensive metabolic panel  . Urinalysis, Routine w reflex microscopic  . Discharge patient Discharge disposition: 01-Home or Self Care; Discharge patient date: 12/18/2017   No orders of the defined types were placed in this encounter.   MDM: PE done and labs reviewed.   Assessment: 1. [redacted] weeks gestation of pregnancy   2. Fatigue during pregnancy in third trimester     Plan:Reviewed signs and symptoms of pre eclampsia.  Discussed common discomforts of pregnancy.   Discharge home in stable condition.   Labor precautions and fetal kick counts Follow-up [facility] Follow up in 1 day(s).   Specialty:  Obstetrics and Gynecology Contact information: [address] [place] [address] [phone]          Allergies as of 12/18/2017      Reactions   Other Anaphylaxis, Itching   Cantaloupe Pt has Vertigo   Cocoa Butter Hives   Codeine Other (  See Comments)   seizures      Medication List    STOP taking these medications   NIFEdipine 10 MG capsule Commonly known as:  PROCARDIA     TAKE these medications   aerochamber plus with mask inhaler Use as instructed   albuterol 108 (90 Base) MCG/ACT inhaler Commonly known as:  PROVENTIL HFA;VENTOLIN HFA Inhale 2 puffs into the lungs every 4 (four) hours as needed for wheezing or shortness of breath.   folic acid 1 MG tablet Commonly known as:  FOLVITE Take 4 mg by mouth daily.   multivitamin-prenatal 27-0.8 MG Tabs tablet Take 1 tablet by mouth every evening.   NATURE-THROID 97.5 MG Tabs Generic drug:  Thyroid Take 1 tablet by mouth 2 (two) times daily.   Vitamin D-3 5000 units Tabs Take 10,000 Units by mouth every evening.       Starla Link, CNM 12/18/2017 8:24 PM

## 2017-12-18 NOTE — MAU Note (Signed)
Pt was at home and felt a little sluggish and decided to check her BP.  BP at home was 137/108 and pulse 115 at rest.  Denies HA or blurred vision, but felt "hot and sweaty all of a sudden." Denies pain, LOF, or vaginal bleeding.

## 2017-12-25 ENCOUNTER — Other Ambulatory Visit: Payer: Self-pay | Admitting: Obstetrics & Gynecology

## 2017-12-26 ENCOUNTER — Telehealth (HOSPITAL_COMMUNITY): Payer: Self-pay | Admitting: *Deleted

## 2017-12-26 NOTE — Telephone Encounter (Signed)
Preadmission screen  

## 2018-01-05 ENCOUNTER — Inpatient Hospital Stay (HOSPITAL_COMMUNITY)
Admission: AD | Admit: 2018-01-05 | Discharge: 2018-01-07 | DRG: 807 | Disposition: A | Discharge status: Home / Self Care | Payer: Medicaid Other | Source: Ambulatory Visit | Attending: Obstetrics & Gynecology | Admitting: Obstetrics & Gynecology

## 2018-01-05 ENCOUNTER — Inpatient Hospital Stay (HOSPITAL_COMMUNITY)
Admission: AD | Admit: 2018-01-05 | Discharge: 2018-01-05 | Disposition: A | Discharge status: Home / Self Care | Payer: Medicaid Other | Source: Ambulatory Visit | Attending: Obstetrics and Gynecology | Admitting: Obstetrics and Gynecology

## 2018-01-05 ENCOUNTER — Encounter (HOSPITAL_COMMUNITY): Payer: Self-pay

## 2018-01-05 ENCOUNTER — Encounter (HOSPITAL_COMMUNITY): Payer: Self-pay | Admitting: Anesthesiology

## 2018-01-05 ENCOUNTER — Inpatient Hospital Stay (HOSPITAL_COMMUNITY): Payer: Medicaid Other

## 2018-01-05 ENCOUNTER — Other Ambulatory Visit: Payer: Self-pay

## 2018-01-05 DIAGNOSIS — Z3483 Encounter for supervision of other normal pregnancy, third trimester: Secondary | ICD-10-CM | POA: Diagnosis present

## 2018-01-05 DIAGNOSIS — Z3A39 39 weeks gestation of pregnancy: Secondary | ICD-10-CM

## 2018-01-05 DIAGNOSIS — O99214 Obesity complicating childbirth: Secondary | ICD-10-CM | POA: Diagnosis present

## 2018-01-05 DIAGNOSIS — O368390 Maternal care for abnormalities of the fetal heart rate or rhythm, unspecified trimester, not applicable or unspecified: Secondary | ICD-10-CM

## 2018-01-05 DIAGNOSIS — J45909 Unspecified asthma, uncomplicated: Secondary | ICD-10-CM | POA: Diagnosis present

## 2018-01-05 DIAGNOSIS — O99213 Obesity complicating pregnancy, third trimester: Secondary | ICD-10-CM | POA: Diagnosis present

## 2018-01-05 DIAGNOSIS — E039 Hypothyroidism, unspecified: Secondary | ICD-10-CM | POA: Diagnosis present

## 2018-01-05 DIAGNOSIS — Z87891 Personal history of nicotine dependence: Secondary | ICD-10-CM

## 2018-01-05 DIAGNOSIS — O479 False labor, unspecified: Secondary | ICD-10-CM

## 2018-01-05 DIAGNOSIS — O9952 Diseases of the respiratory system complicating childbirth: Secondary | ICD-10-CM | POA: Diagnosis present

## 2018-01-05 DIAGNOSIS — O4290 Premature rupture of membranes, unspecified as to length of time between rupture and onset of labor, unspecified weeks of gestation: Secondary | ICD-10-CM | POA: Diagnosis present

## 2018-01-05 DIAGNOSIS — Z85850 Personal history of malignant neoplasm of thyroid: Secondary | ICD-10-CM | POA: Diagnosis not present

## 2018-01-05 DIAGNOSIS — O99284 Endocrine, nutritional and metabolic diseases complicating childbirth: Secondary | ICD-10-CM | POA: Diagnosis present

## 2018-01-05 LAB — CBC
HCT: 36.9 % (ref 36.0–46.0)
Hemoglobin: 12.2 g/dL (ref 12.0–15.0)
MCH: 27.2 pg (ref 26.0–34.0)
MCHC: 33.1 g/dL (ref 30.0–36.0)
MCV: 82.2 fL (ref 78.0–100.0)
PLATELETS: 209 10*3/uL (ref 150–400)
RBC: 4.49 MIL/uL (ref 3.87–5.11)
RDW: 16.8 % — AB (ref 11.5–15.5)
WBC: 10 10*3/uL (ref 4.0–10.5)

## 2018-01-05 LAB — TYPE AND SCREEN
ABO/RH(D): O POS
ANTIBODY SCREEN: NEGATIVE

## 2018-01-05 LAB — AMNISURE RUPTURE OF MEMBRANE (ROM) NOT AT ARMC: Amnisure ROM: NEGATIVE

## 2018-01-05 LAB — POCT FERN TEST
POCT FERN TEST: NEGATIVE
POCT Fern Test: POSITIVE
POCT Fern Test: POSITIVE

## 2018-01-05 MED ORDER — SOD CITRATE-CITRIC ACID 500-334 MG/5ML PO SOLN: 30.0000 mL | ORAL | Status: DC | PRN

## 2018-01-05 MED ORDER — LACTATED RINGERS IV SOLN: INTRAVENOUS | Status: DC

## 2018-01-05 MED ORDER — ZOLPIDEM TARTRATE 5 MG PO TABS: 5.0000 mg | ORAL_TABLET | Freq: Every evening | ORAL | Status: DC | PRN

## 2018-01-05 MED ORDER — LACTATED RINGERS IV SOLN: 500.0000 mL | INTRAVENOUS | Status: DC | PRN

## 2018-01-05 MED ORDER — DIPHENHYDRAMINE HCL 50 MG/ML IJ SOLN: 12.5000 mg | INTRAMUSCULAR | Status: DC | PRN

## 2018-01-05 MED ORDER — WITCH HAZEL-GLYCERIN EX PADS: 1.0000 "application " | MEDICATED_PAD | CUTANEOUS | Status: DC | PRN

## 2018-01-05 MED ORDER — OXYTOCIN BOLUS FROM INFUSION
500.0000 mL | Freq: Once | INTRAVENOUS | Status: AC
  Administered 2018-01-05: 500 mL via INTRAVENOUS

## 2018-01-05 MED ORDER — ACETAMINOPHEN 325 MG PO TABS: 650.0000 mg | ORAL_TABLET | ORAL | Status: DC | PRN

## 2018-01-05 MED ORDER — LIDOCAINE HCL (PF) 1 % IJ SOLN
30.0000 mL | INTRAMUSCULAR | Status: DC | PRN
  Filled 2018-01-05: qty 30

## 2018-01-05 MED ORDER — FENTANYL 2.5 MCG/ML BUPIVACAINE 1/10 % EPIDURAL INFUSION (WH - ANES)
14.0000 mL/h | INTRAMUSCULAR | Status: DC | PRN
  Filled 2018-01-05: qty 100

## 2018-01-05 MED ORDER — DIBUCAINE 1 % RE OINT: 1.0000 "application " | TOPICAL_OINTMENT | RECTAL | Status: DC | PRN

## 2018-01-05 MED ORDER — LEVOTHYROXINE SODIUM 112 MCG PO TABS
224.0000 ug | ORAL_TABLET | Freq: Every day | ORAL | Status: DC
  Administered 2018-01-06 – 2018-01-07 (×2): 224 ug via ORAL
  Filled 2018-01-05 (×2): qty 2

## 2018-01-05 MED ORDER — OXYTOCIN BOLUS FROM INFUSION: 500.0000 mL | Freq: Once | INTRAVENOUS | Status: DC

## 2018-01-05 MED ORDER — SIMETHICONE 80 MG PO CHEW: 80.0000 mg | CHEWABLE_TABLET | ORAL | Status: DC | PRN

## 2018-01-05 MED ORDER — FENTANYL CITRATE (PF) 100 MCG/2ML IJ SOLN
50.0000 ug | INTRAMUSCULAR | Status: DC | PRN
Start: 2018-01-05 — End: 2018-01-05
  Administered 2018-01-05: 50 ug via INTRAVENOUS
  Filled 2018-01-05: qty 2

## 2018-01-05 MED ORDER — PHENYLEPHRINE 40 MCG/ML (10ML) SYRINGE FOR IV PUSH (FOR BLOOD PRESSURE SUPPORT)
80.0000 ug | PREFILLED_SYRINGE | INTRAVENOUS | Status: DC | PRN
  Filled 2018-01-05: qty 5
  Filled 2018-01-05: qty 10

## 2018-01-05 MED ORDER — EPHEDRINE 5 MG/ML INJ
10.0000 mg | INTRAVENOUS | Status: DC | PRN
  Filled 2018-01-05: qty 2

## 2018-01-05 MED ORDER — DIPHENHYDRAMINE HCL 25 MG PO CAPS: 25.0000 mg | ORAL_CAPSULE | Freq: Four times a day (QID) | ORAL | Status: DC | PRN

## 2018-01-05 MED ORDER — ONDANSETRON HCL 4 MG/2ML IJ SOLN: 4.0000 mg | Freq: Four times a day (QID) | INTRAMUSCULAR | Status: DC | PRN

## 2018-01-05 MED ORDER — TRAMADOL HCL 50 MG PO TABS: 50.0000 mg | ORAL_TABLET | Freq: Four times a day (QID) | ORAL | Status: DC | PRN

## 2018-01-05 MED ORDER — FENTANYL CITRATE (PF) 100 MCG/2ML IJ SOLN: 50.0000 ug | INTRAMUSCULAR | Status: DC | PRN

## 2018-01-05 MED ORDER — ALBUTEROL SULFATE (2.5 MG/3ML) 0.083% IN NEBU: 3.0000 mL | INHALATION_SOLUTION | RESPIRATORY_TRACT | Status: DC | PRN

## 2018-01-05 MED ORDER — COCONUT OIL OIL
1.0000 "application " | TOPICAL_OIL | Status: DC | PRN
  Administered 2018-01-07: 1 via TOPICAL
  Filled 2018-01-05: qty 120

## 2018-01-05 MED ORDER — IBUPROFEN 600 MG PO TABS
600.0000 mg | ORAL_TABLET | Freq: Four times a day (QID) | ORAL | Status: DC
  Administered 2018-01-05 – 2018-01-07 (×6): 600 mg via ORAL
  Filled 2018-01-05 (×6): qty 1

## 2018-01-05 MED ORDER — LACTATED RINGERS IV SOLN
500.0000 mL | Freq: Once | INTRAVENOUS | Status: AC
  Administered 2018-01-05: 500 mL via INTRAVENOUS

## 2018-01-05 MED ORDER — TETANUS-DIPHTH-ACELL PERTUSSIS 5-2.5-18.5 LF-MCG/0.5 IM SUSP: 0.5000 mL | Freq: Once | INTRAMUSCULAR | Status: DC

## 2018-01-05 MED ORDER — ONDANSETRON HCL 4 MG PO TABS: 4.0000 mg | ORAL_TABLET | ORAL | Status: DC | PRN

## 2018-01-05 MED ORDER — OXYTOCIN 40 UNITS IN LACTATED RINGERS INFUSION - SIMPLE MED
2.5000 [IU]/h | INTRAVENOUS | Status: DC
  Filled 2018-01-05 (×2): qty 1000

## 2018-01-05 MED ORDER — PHENYLEPHRINE 40 MCG/ML (10ML) SYRINGE FOR IV PUSH (FOR BLOOD PRESSURE SUPPORT)
80.0000 ug | PREFILLED_SYRINGE | INTRAVENOUS | Status: DC | PRN
  Filled 2018-01-05: qty 5

## 2018-01-05 MED ORDER — BENZOCAINE-MENTHOL 20-0.5 % EX AERO
1.0000 "application " | INHALATION_SPRAY | CUTANEOUS | Status: DC | PRN
  Administered 2018-01-06: 1 via TOPICAL
  Filled 2018-01-05: qty 56

## 2018-01-05 MED ORDER — LIDOCAINE HCL (PF) 1 % IJ SOLN: 30.0000 mL | INTRAMUSCULAR | Status: DC | PRN

## 2018-01-05 MED ORDER — ONDANSETRON HCL 4 MG/2ML IJ SOLN: 4.0000 mg | INTRAMUSCULAR | Status: DC | PRN

## 2018-01-05 MED ORDER — PRENATAL MULTIVITAMIN CH
1.0000 | ORAL_TABLET | Freq: Every day | ORAL | Status: DC
  Administered 2018-01-06: 1 via ORAL
  Filled 2018-01-05: qty 1

## 2018-01-05 MED ORDER — OXYTOCIN 40 UNITS IN LACTATED RINGERS INFUSION - SIMPLE MED: 2.5000 [IU]/h | INTRAVENOUS | Status: DC

## 2018-01-05 MED ORDER — HYDROXYZINE HCL 50 MG PO TABS
50.0000 mg | ORAL_TABLET | Freq: Four times a day (QID) | ORAL | Status: DC | PRN
  Filled 2018-01-05: qty 1

## 2018-01-05 MED ORDER — LACTATED RINGERS IV SOLN
INTRAVENOUS | Status: DC
  Administered 2018-01-05: 19:00:00 via INTRAVENOUS

## 2018-01-05 MED ORDER — SENNOSIDES-DOCUSATE SODIUM 8.6-50 MG PO TABS
2.0000 | ORAL_TABLET | ORAL | Status: DC
  Administered 2018-01-06 – 2018-01-07 (×2): 2 via ORAL
  Filled 2018-01-05 (×2): qty 2

## 2018-01-05 MED ORDER — ACETAMINOPHEN 325 MG PO TABS
650.0000 mg | ORAL_TABLET | ORAL | Status: DC | PRN
  Administered 2018-01-06: 650 mg via ORAL
  Filled 2018-01-05: qty 2

## 2018-01-05 MED ORDER — FLEET ENEMA 7-19 GM/118ML RE ENEM: 1.0000 | ENEMA | RECTAL | Status: DC | PRN

## 2018-01-05 NOTE — H&P (Signed)
Sharon Benson is a 42 y.o. female, [id number] at 39.3 weeks, presenting for labor.  Pt had SROM clear fluid at 1700.  FM pos Prenatal hx remarkable for Morbid obesity, AMA, Hx of thyroid cancer with hypothyroidism, asthma andabnormal fetal echo with dextrorotation of the heart  Patient Active Problem List   Diagnosis Date Noted  . Rupture of membranes with delay of delivery 01/05/2018  . Preterm labor in third trimester 11/17/2017  . Asthma affecting pregnancy, antepartum 08/03/2017  . SOB (shortness of breath) 08/03/2017  . Feeling of chest tightness 08/03/2017  . Fibroids 06/03/2017  . AMA (advanced maternal age) multigravida 35+ 06/03/2017  . 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 Morbid obesity with BMI of 45.0-49.9, adult ([place]) 06/03/2017  . Palpitations 11/09/2015  . Thyroid mass 01/02/2015  . GOITER, UNSPECIFIED 07/20/2010  . MIGRAINE HEADACHE 07/20/2010  . SEIZURE DISORDER, HX OF 07/20/2010    History of present pregnancy: Patient entered care at < 12  weeks.   EDC of 01/09/2018 was established by LMP.   Anatomy scan:  20 weeks, with abnormal  Findings of fetal heart.  Echo done with confirmation of results Additional [name] evaluations:  Antenatal testing Significant prenatal events:  Preterm contractions. Last evaluation:  Last week  OB History    Gravida Para Term Preterm AB Living   5 1 1   3 1    SAB TAB Ectopic Multiple Live Births   3       1     Past Medical History:  Diagnosis Date  . Anxiety    panic attack - not recent 2001ish  . Arthritis   . Endometriosis   . Fibroid   . GERD (gastroesophageal reflux disease)   . Headache(784.0)   . Hypothyroidism   . Palpitations   . PONV (postoperative nausea and vomiting)    last 2- no vomiting-  Seizures- not with last 2.  . Seizures ([place])    last seizure 04/2017  . Seizures ([place])   . SVD (spontaneous vaginal delivery)    x 1   Past Surgical History:  Procedure Laterality Date  . BREAST REDUCTION  SURGERY    . BREAST SURGERY  1997  . DILATION AND CURETTAGE OF UTERUS    . DILATION AND EVACUATION N/A 11/25/2013   Procedure: DILATATION AND EVACUATION;  Surgeon: Woodroe Mode, MD;  Location: [facility];  Service: Gynecology;  Laterality: N/A;  . DILATION AND EVACUATION N/A 11/30/2013   Procedure: DILATATION AND EVACUATION;  Surgeon: Jonnie Kind, MD;  Location: [facility];  Service: Gynecology;  Laterality: N/A;  . DILATION AND EVACUATION N/A 05/02/2014   Procedure: DILATATION AND EVACUATION;  Surgeon: Ena Dawley, MD;  Location: [facility];  Service: Gynecology;  Laterality: N/A;  . GANGLION CYST EXCISION  1999   right hand  . THYROIDECTOMY  01/02/2015  . THYROIDECTOMY Bilateral 01/02/2015   Procedure: TOTAL THYROIDECTOMY;  Surgeon: Melissa Montane, MD;  Location: [facility];  Service: ENT;  Laterality: Bilateral;   Family History: family history includes Cancer in her other; Colon cancer in her maternal grandmother; Diabetes in her mother, other, and paternal grandmother; Heart disease in her paternal grandmother; High blood pressure in her other; Hypertension in her father and paternal grandmother; Lung cancer in her maternal grandmother. Social History:  reports that she quit smoking about 20 years ago. Her smoking use included cigarettes. She quit after 2.00 years of use. she has never used smokeless tobacco. She reports that she does  not drink alcohol or use drugs.   Prenatal Transfer Tool  Maternal Diabetes: No Genetic Screening: Normal Maternal Ultrasounds/Referrals: Abnormal:  Findings:   Cardiac defect Fetal Ultrasounds or other Referrals:  Fetal echo Maternal Substance Abuse:  No Significant Maternal Medications:  Meds include: Other: levothyroxine Significant Maternal Lab Results: Lab values include: Other: abnormal TSH  TDAP Declined Flu Declined  ROS:  All ten systems reviewed and negative except as stated above  Allergies  Allergen Reactions  . Other Anaphylaxis and Itching     Cantaloupe  Pt has Vertigo  . Cocoa Butter Hives  . Codeine Other (See Comments)    seizures     Dilation: 7 Effacement (%): 100 Station: -2 Exam by:: anette millner, rn  Blood pressure 130/77, pulse (!) 101, temperature (!) 97.3 F (36.3 C), temperature source Oral, resp. rate 20, height 5\' 4"  (1.626 m), weight (!) 136.5 kg (301 lb), last menstrual period 04/04/2017, SpO2 97 %.  Chest clear Heart RRR without murmur Abd gravid, NT, FH appropriate Pelvic: per RN Ext: +2/+2 neg clonus  FHR: Category 1 UCs:  2-3  Prenatal labs: ABO, Rh: --/--/O POS (12/07 0347) Antibody: NEG (12/07 0347) Rubella:  Immune (06/12 0000) RPR: Non Reactive (12/07 0347)  HBsAg: Negative (06/12 0000)  HIV: Non-reactive (06/12 0000)  GBS: Negative (01/02 0000) Sickle cell/Hgb electrophoresis:  AA Pap:   GC:  Neg Chlamydia:  Neg Genetic screenings:  NIPT wnl Glucola:  140 with three hour wnl Other:   Hgb 12.2 at NOB, 10.4 at 28 weeks       Assessment/Plan: IUP at 39.3 IUP presents in active labor Cat 1 strip Hypothyroidism Morbid obesity AMA Asthma Abnormal fetal echo  Plan: Admit to Birthing Suite  Routine [facility] orders Pain med/epidural prn Anticipate SVD   Pleas Koch ProtheroCNM, MSN 01/05/2018, 8:27 PM

## 2018-01-05 NOTE — MAU Note (Signed)
Checked two different fern slides, obtained one sample from the towel the patient had between her legs and one sample with a vaginal swab. Both negative.

## 2018-01-05 NOTE — MAU Note (Signed)
Started leaking at 0515, clear fluid, small amt still coming. No bleeding now. Cramping every 8 min.  Was 1 cm when last checked.

## 2018-01-05 NOTE — Discharge Instructions (Signed)
Braxton Hicks Contractions °Contractions of the uterus can occur throughout pregnancy, but they are not always a sign that you are in labor. You may have practice contractions called Braxton Hicks contractions. These false labor contractions are sometimes confused with true labor. °What are Braxton Hicks contractions? °Braxton Hicks contractions are tightening movements that occur in the muscles of the uterus before labor. Unlike true labor contractions, these contractions do not result in opening (dilation) and thinning of the cervix. Toward the end of pregnancy (32-34 weeks), Braxton Hicks contractions can happen more often and may become stronger. These contractions are sometimes difficult to tell apart from true labor because they can be very uncomfortable. You should not feel embarrassed if you go to the hospital with false labor. °Sometimes, the only way to tell if you are in true labor is for your health care provider to look for changes in the cervix. The health care provider will do a physical exam and may monitor your contractions. If you are not in true labor, the exam should show that your cervix is not dilating and your water has not broken. °If there are other health problems associated with your pregnancy, it is completely safe for you to be sent home with false labor. You may continue to have Braxton Hicks contractions until you go into true labor. °How to tell the difference between true labor and false labor °True labor °· Contractions last 30-70 seconds. °· Contractions become very regular. °· Discomfort is usually felt in the top of the uterus, and it spreads to the lower abdomen and low back. °· Contractions do not go away with walking. °· Contractions usually become more intense and increase in frequency. °· The cervix dilates and gets thinner. °False labor °· Contractions are usually shorter and not as strong as true labor contractions. °· Contractions are usually irregular. °· Contractions  are often felt in the front of the lower abdomen and in the groin. °· Contractions may go away when you walk around or change positions while lying down. °· Contractions get weaker and are shorter-lasting as time goes on. °· The cervix usually does not dilate or become thin. °Follow these instructions at home: °· Take over-the-counter and prescription medicines only as told by your health care provider. °· Keep up with your usual exercises and follow other instructions from your health care provider. °· Eat and drink lightly if you think you are going into labor. °· If Braxton Hicks contractions are making you uncomfortable: °? Change your position from lying down or resting to walking, or change from walking to resting. °? Sit and rest in a tub of warm water. °? Drink enough fluid to keep your urine pale yellow. Dehydration may cause these contractions. °? Do slow and deep breathing several times an hour. °· Keep all follow-up prenatal visits as told by your health care provider. This is important. °Contact a health care provider if: °· You have a fever. °· You have continuous pain in your abdomen. °Get help right away if: °· Your contractions become stronger, more regular, and closer together. °· You have fluid leaking or gushing from your vagina. °· You pass blood-tinged mucus (bloody show). °· You have bleeding from your vagina. °· You have low back pain that you never had before. °· You feel your baby’s head pushing down and causing pelvic pressure. °· Your baby is not moving inside you as much as it used to. °Summary °· Contractions that occur before labor are called Braxton   Hicks contractions, false labor, or practice contractions. °· Braxton Hicks contractions are usually shorter, weaker, farther apart, and less regular than true labor contractions. True labor contractions usually become progressively stronger and regular and they become more frequent. °· Manage discomfort from Braxton Hicks contractions by  changing position, resting in a warm bath, drinking plenty of water, or practicing deep breathing. °This information is not intended to replace advice given to you by your health care provider. Make sure you discuss any questions you have with your health care provider. °Document Released: 04/13/2017 Document Revised: 04/13/2017 Document Reviewed: 04/13/2017 °Elsevier Interactive Patient Education © 2018 [employer] Inc. ° °

## 2018-01-06 LAB — CBC
HCT: 30.5 % — ABNORMAL LOW (ref 36.0–46.0)
Hemoglobin: 10.1 g/dL — ABNORMAL LOW (ref 12.0–15.0)
MCH: 27.4 pg (ref 26.0–34.0)
MCHC: 33.1 g/dL (ref 30.0–36.0)
MCV: 82.7 fL (ref 78.0–100.0)
PLATELETS: 201 10*3/uL (ref 150–400)
RBC: 3.69 MIL/uL — AB (ref 3.87–5.11)
RDW: 16.7 % — ABNORMAL HIGH (ref 11.5–15.5)
WBC: 11.2 10*3/uL — AB (ref 4.0–10.5)

## 2018-01-06 LAB — COMPREHENSIVE METABOLIC PANEL
ALBUMIN: 2.3 g/dL — AB (ref 3.5–5.0)
ALT: 9 U/L — AB (ref 14–54)
AST: 19 U/L (ref 15–41)
Alkaline Phosphatase: 113 U/L (ref 38–126)
Anion gap: 8 (ref 5–15)
BUN: 8 mg/dL (ref 6–20)
CHLORIDE: 106 mmol/L (ref 101–111)
CO2: 22 mmol/L (ref 22–32)
CREATININE: 0.74 mg/dL (ref 0.44–1.00)
Calcium: 9.1 mg/dL (ref 8.9–10.3)
GFR calc Af Amer: >60 mL/min (ref >60)
Glucose, Bld: 102 mg/dL — ABNORMAL HIGH (ref 65–99)
Potassium: 4.4 mmol/L (ref 3.5–5.1)
SODIUM: 136 mmol/L (ref 135–145)
Total Bilirubin: 0.4 mg/dL (ref 0.3–1.2)
Total Protein: 5.6 g/dL — ABNORMAL LOW (ref 6.5–8.1)

## 2018-01-06 LAB — RPR: RPR: NONREACTIVE

## 2018-01-06 NOTE — Clinical Social Work Note (Addendum)
.  MOB was referred for history of anxiety.  Referral is screened out by Clinical Social Worker because none of the following criteria appear to apply   -History of anxiety/depression during this pregnancy, or of post-partum depression. (chart reflects panic attack in 2001) - Diagnosis of anxiety and/or depression within last 3 years.-  - History of depression due to pregnancy loss/loss of child  and  there are no reports impacting the pregnancy or her transition to the postpartum period.   CSW does not deem it clinically necessary to further investigate at this time.   Please contact the Clinical Social Worker if needs arise or upon MOB request.

## 2018-01-06 NOTE — Progress Notes (Signed)
Subjective: Postpartum Day 1: Vaginal delivery, 1st degree perineal laceration Patient up ad lib, reports no syncope or dizziness. 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  Objective: Vital signs in last 24 hours: Temp:  [97.3 F (36.3 C)-98.7 F (37.1 C)] 98.2 F (36.8 C) (01/26 0900) Pulse Rate:  [87-221] 104 (01/26 0900) Resp:  [16-20] 18 (01/26 0900) BP: (112-143)/(58-99) 131/58 (01/26 0900) SpO2:  [97 %-100 %] 99 % (01/26 0900) Weight:  [136.5 kg (301 lb)] 136.5 kg (301 lb) (01/25 1950)   Vitals:   01/05/18 2248 01/06/18 0145 01/06/18 0758 01/06/18 0900  BP: 137/82 (!) 134/95  (!) 131/58  Pulse: (!) 103 (!) 101  (!) 104  Resp: 18 16  18   Temp: 98.3 F (36.8 C) 98.7 F (37.1 C)  98.2 F (36.8 C)  TempSrc: Oral Oral Axillary Oral  SpO2: 100% 99%  99%  Weight:      Height:        Physical Exam:  General: alert Lochia: appropriate Uterine Fundus: firm Perineum: healing well DVT Evaluation: No evidence of DVT seen on physical exam. Negative Homan's sign.   CBC Latest Ref Rng & Units 01/06/2018 01/05/2018 12/18/2017  WBC 4.0 - 10.5 K/uL 11.2(H) 10.0 7.9  Hemoglobin 12.0 - 15.0 g/dL 10.1(L) 12.2 10.7(L)  Hematocrit 36.0 - 46.0 % 30.5(L) 36.9 33.1(L)  Platelets 150 - 400 K/uL 201 209 184     Assessment/Plan: Status post vaginal delivery day 1. 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 Plan for discharge tomorrow    Sharon Benson 01/06/2018, 9:48 AM

## 2018-01-06 NOTE — Progress Notes (Signed)
Patient asked for education on post partum depression. Consult in already for anxiety.

## 2018-01-06 NOTE — Lactation Note (Signed)
This note was copied from a baby's chart. Lactation Consultation Note Baby 65 hrs old mom stated she gave formula and baby threw it up and some mucous.  Mom stated she feels that the baby can't tolerate dairy because her oldest daughter now 40 yr old is dairy intolerant. So she only wanted organic non-dairy formula. Discussed w/mom newborn behavior and feeding habits, spitting up mucous. Mom comforted to know normal at this time. Mom had breast reduction at age 56 yrs old. Mom stated her breast were down to her vagina, she could sit on them,. Mom's nipples were removed, mom has scars from one side in front to the other side. Axillary to axillary under breast.  Nipples had no feeling until mom became pregnant, per mom. Everted nipples evert more w/stimulation. Rt. Areola and nipple w/edema. Mom stated the baby prefers the Lt. Breast. Kandis Cocking toRt. Makes the nipple thick feeling, probably unable to obtain deep latch. Reverse pressure extremely helpful. Encouraged mom to do that if notices edema. Mom using DEBP when Maxeys entered rm. Pumped scant amount of colostrum. Praised mom, mom rubbed colostrum to baby's lips. Mom encouraged to feed baby 8-12 times/24 hours and with feeding cues. Encouraged to wake baby if hasn't cued in 3 hrs. A lot of explaining and calming mom about BF. Discussed may have to supplement, time will tell, for now take it one day at a time. Mom thankful for [facility] encouragement.encouraged to call for assistance or questions. [place] brochure given w/resources, support groups and [place] services. Patient Name: Sharon Benson [id number] Date: 01/06/2018 Reason for consult: Initial assessment   Maternal Data Has patient been taught Hand Expression?: Yes Does the patient have breastfeeding experience prior to this delivery?: Yes  Feeding Feeding Type: Breast Fed  LATCH Score       Type of Nipple: Flat  Comfort (Breast/Nipple): Soft / non-tender         Interventions Interventions: Breast feeding basics reviewed;Reverse pressure;Breast compression;Adjust position;Breast massage;Support pillows;Hand express;Position options;DEBP  Lactation Tools Discussed/Used Tools: Pump Pump Review: Setup, frequency, and cleaning;Milk Storage Initiated by:: Allayne Stack RN IBCLC Date initiated:: 01/06/18   Consult Status Consult Status: Follow-up Date: 01/06/18 Follow-up type: In-patient    Willamae Demby, Elta [place] 01/06/2018, 6:08 AM

## 2018-01-07 DIAGNOSIS — E039 Hypothyroidism, unspecified: Secondary | ICD-10-CM | POA: Diagnosis present

## 2018-01-07 MED ORDER — IBUPROFEN 600 MG PO TABS: 600.0000 mg | ORAL_TABLET | Freq: Four times a day (QID) | ORAL | 2 refills | Status: DC | PRN

## 2018-01-07 NOTE — Lactation Note (Signed)
This note was copied from a baby's chart. Lactation Consultation Note  Patient Name: Sharon Benson Date: 01/07/2018  Mom with history of a breast reduction in 1997.  Baby latches well to both breasts for 15-20 minutes every 1-3 hours.  When baby is still acting hungry mom supplements with 10 mls of formula.  Sharon Benson is now 71 hours old.  Discussed increasing volume as baby desires.  Mom pleased with how things are progressing.  She states she hasn't had much opportunity to pump because baby is feeding frequently.  Discussed monitoring supply and recommended an outpatient visit for pre/post weights.  C/o thick areolar tissue on right side.  Breast shell given with instructions.  Reviewed outpatient services.   Maternal Data    Feeding Feeding Type: Unknown Nipple Type: Slow - flow Length of feed: 15 min  LATCH Score                   Interventions    Lactation Tools Discussed/Used     Consult Status      Ave Filter 01/07/2018, 9:58 AM

## 2018-01-07 NOTE — Discharge Summary (Signed)
OB Discharge Summary     Patient Name: Sharon Benson DOB: 14-Jul-1976 MRN: [id number]  Date of admission: 01/05/2018 Delivering MD: Starla Link   Date of discharge: 01/07/2018  Admitting diagnosis: 39wks CTX 1 to 2 mins apart Intrauterine pregnancy: [redacted]w[redacted]d     Secondary diagnosis:  Principal Problem:   SVD (spontaneous vaginal delivery) Active Problems:   Rupture of membranes with delay of delivery   Obesity affecting pregnancy, antepartum, third trimester   Hypothyroidism  Additional problems: NA     Discharge diagnosis: Term Pregnancy Delivered                                                                                                Post partum procedures:NA  Augmentation: NA  Complications: None  Hospital course:  Onset of Labor With Vaginal Delivery     42 y.o. yo [id number] at [redacted]w[redacted]d was admitted in Active Labor on 01/05/2018. Patient had an uncomplicated labor course as follows:  Membrane Rupture Time/Date: 5:45 PM ,01/05/2018   Intrapartum Procedures: Episiotomy: None [1]                                         Lacerations:  1st degree [2];Vaginal [6]  Patient had a delivery of a Viable infant. 01/05/2018  Information for the patient's newborn:  Sharon, Benson [[id number]]  Delivery Method: Vaginal, Spontaneous(Filed from Delivery Summary)    Pateint had an uncomplicated postpartum course.  She is ambulating, tolerating a regular diet, passing flatus, and urinating well. Patient is discharged home in stable condition on 01/07/18.   Physical exam  Vitals:   01/06/18 0758 01/06/18 0900 01/06/18 1717 01/07/18 0615  BP:  (!) 131/58 102/65 120/66  Pulse:  (!) 104 95 92  Resp:  18 16 18   Temp:  98.2 F (36.8 C) 97.7 F (36.5 C) 98.1 F (36.7 C)  TempSrc: Axillary Oral Axillary Axillary  SpO2:  99% 100% 100%  Weight:      Height:       General: alert Lochia: appropriate Uterine Fundus: firm Incision: Healing well with no significant  drainage DVT Evaluation: No evidence of DVT seen on physical exam. Negative Homan's sign. Labs: Lab Results  Component Value Date   WBC 11.2 (H) 01/06/2018   HGB 10.1 (L) 01/06/2018   HCT 30.5 (L) 01/06/2018   MCV 82.7 01/06/2018   PLT 201 01/06/2018   CMP Latest Ref Rng & Units 01/06/2018  Glucose 65 - 99 mg/dL 102(H)  BUN 6 - 20 mg/dL 8  Creatinine 0.44 - 1.00 mg/dL 0.74  Sodium 135 - 145 mmol/L 136  Potassium 3.5 - 5.1 mmol/L 4.4  Chloride 101 - 111 mmol/L 106  CO2 22 - 32 mmol/L 22  Calcium 8.9 - 10.3 mg/dL 9.1  Total Protein 6.5 - 8.1 g/dL 5.6(L)  Total Bilirubin 0.3 - 1.2 mg/dL 0.4  Alkaline Phos 38 - 126 U/L 113  AST 15 - 41 U/L 19  ALT 14 - 54 U/L 9(L)  Discharge instruction: per After Visit Summary and "Baby and Me Booklet".  After visit meds:  Allergies as of 01/07/2018      Reactions   Other Anaphylaxis, Itching   Cantaloupe Pt has Vertigo   Cocoa Butter Hives   Codeine Other (See Comments)   seizures      Medication List    TAKE these medications   aerochamber plus with mask inhaler Use as instructed   albuterol 108 (90 Base) MCG/ACT inhaler Commonly known as:  PROVENTIL HFA;VENTOLIN HFA Inhale 2 puffs into the lungs every 4 (four) hours as needed for wheezing or shortness of breath.   ibuprofen 600 MG tablet Commonly known as:  ADVIL,MOTRIN Take 1 tablet (600 mg total) by mouth every 6 (six) hours as needed.   IRON PO Take 1 tablet by mouth daily.   levothyroxine 112 MCG tablet Commonly known as:  SYNTHROID, LEVOTHROID Take 224 mcg by mouth daily before breakfast.   multivitamin-prenatal 27-0.8 MG Tabs tablet Take 1 tablet by mouth every evening.   Vitamin D-3 5000 units Tabs Take 10,000 Units by mouth every evening.       Diet: routine diet  Activity: Advance as tolerated. Pelvic rest for 6 weeks.   Outpatient follow up:5 weeks Follow up Appt:No future appointments. Follow up Visit:No Follow-up on file.  Postpartum  contraception: IUD Mirena  Newborn Data: Live born female  Birth Weight: 7 lb 3.3 oz (3269 g) APGAR: 9, 9  Newborn Delivery   Birth date/time:  01/05/2018 19:24:00 Delivery type:  Vaginal, Spontaneous     Baby Feeding: Breast Disposition:home with mother   01/07/2018 Sharon Benson, CNM

## 2018-01-07 NOTE — Discharge Instructions (Signed)
Postpartum Depression and Baby Blues The postpartum period begins right after the birth of a baby. During this time, there is often a great amount of joy and excitement. It is also a time of many changes in the life of the parents. Regardless of how many times a mother gives birth, each child brings new challenges and dynamics to the family. It is not unusual to have feelings of excitement along with confusing shifts in moods, emotions, and thoughts. All mothers are at risk of developing postpartum depression or the "baby blues." These mood changes can occur right after giving birth, or they may occur many months after giving birth. The baby blues or postpartum depression can be mild or severe. Additionally, postpartum depression can go away rather quickly, or it can be a long-term condition. What are the causes? Raised hormone levels and the rapid drop in those levels are thought to be a main cause of postpartum depression and the baby blues. A number of hormones change during and after pregnancy. Estrogen and progesterone usually decrease right after the delivery of your baby. The levels of thyroid hormone and various cortisol steroids also rapidly drop. Other factors that play a role in these mood changes include major life events and genetics. What increases the risk? If you have any of the following risks for the baby blues or postpartum depression, know what symptoms to watch out for during the postpartum period. Risk factors that may increase the likelihood of getting the baby blues or postpartum depression include:  Having a personal or family history of depression.  Having depression while being pregnant.  Having premenstrual mood issues or mood issues related to oral contraceptives.  Having a lot of life stress.  Having marital conflict.  Lacking a social support network.  Having a baby with special needs.  Having health problems, such as diabetes.  What are the signs or  symptoms? Symptoms of baby blues include:  Brief changes in mood, such as going from extreme happiness to sadness.  Decreased concentration.  Difficulty sleeping.  Crying spells, tearfulness.  Irritability.  Anxiety.  Symptoms of postpartum depression typically begin within the first month after giving birth. These symptoms include:  Difficulty sleeping or excessive sleepiness.  Marked weight loss.  Agitation.  Feelings of worthlessness.  Lack of interest in activity or food.  Postpartum psychosis is a very serious condition and can be dangerous. Fortunately, it is rare. Displaying any of the following symptoms is cause for immediate medical attention. Symptoms of postpartum psychosis include:  Hallucinations and delusions.  Bizarre or disorganized behavior.  Confusion or disorientation.  How is this diagnosed? A diagnosis is made by an evaluation of your symptoms. There are no medical or lab tests that lead to a diagnosis, but there are various questionnaires that a health care provider may use to identify those with the baby blues, postpartum depression, or psychosis. Often, a screening tool called the [place] Postnatal Depression Scale is used to diagnose depression in the postpartum period. How is this treated? The baby blues usually goes away on its own in 1-2 weeks. Social support is often all that is needed. You will be encouraged to get adequate sleep and rest. Occasionally, you may be given medicines to help you sleep. Postpartum depression requires treatment because it can last several months or longer if it is not treated. Treatment may include individual or group therapy, medicine, or both to address any social, physiological, and psychological factors that may play a role in the  requires treatment because it can last several months or longer if it is not treated. Treatment may include individual or group therapy, medicine, or both to address any social, physiological, and psychological factors that may play a role in the depression. Regular exercise, a healthy diet, rest, and social support may also be strongly recommended.  Postpartum psychosis is more serious and needs treatment right away.  Hospitalization is often needed.  Follow these instructions at home:   Get as much rest as you can. Nap when the baby sleeps.   Exercise regularly. Some women find yoga and walking to be beneficial.   Eat a balanced and nourishing diet.   Do little things that you enjoy. Have a cup of tea, take a bubble bath, read your favorite magazine, or listen to your favorite music.   Avoid alcohol.   Ask for help with household chores, cooking, grocery shopping, or running errands as needed. Do not try to do everything.   Talk to people close to you about how you are feeling. Get support from your partner, family members, friends, or other new moms.   Try to stay positive in how you think. Think about the things you are grateful for.   Do not spend a lot of time alone.   Only take over-the-counter or prescription medicine as directed by your health care provider.   Keep all your postpartum appointments.   Let your health care provider know if you have any concerns.  Contact a health care provider if:  You are having a reaction to or problems with your medicine.  Get help right away if:   You have suicidal feelings.   You think you may harm the baby or someone else.  This information is not intended to replace advice given to you by your health care provider. Make sure you discuss any questions you have with your health care provider.  Document Released: 09/01/2004 Document Revised: 05/05/2016 Document Reviewed: 09/09/2013  Elsevier Interactive Patient Education  2017 [employer]  Postpartum Care After Vaginal Delivery  The period of time right after you deliver your newborn is called the postpartum period.  What kind of medical care will I receive?   You may continue to receive fluids and medicines through an IV tube inserted into one of your veins.   If an incision was made near your vagina (episiotomy) or if you had some vaginal tearing during delivery, cold compresses may be placed on your episiotomy or  your tear. This helps to reduce pain and swelling.   You may be given a squirt bottle to use when you go to the bathroom. You may use this until you are comfortable wiping as usual. To use the squirt bottle, follow these steps:  ? Before you urinate, fill the squirt bottle with warm water. Do not use hot water.  ? After you urinate, while you are sitting on the toilet, use the squirt bottle to rinse the area around your urethra and vaginal opening. This rinses away any urine and blood.  ? You may do this instead of wiping. As you start healing, you may use the squirt bottle before wiping yourself. Make sure to wipe gently.  ? Fill the squirt bottle with clean water every time you use the bathroom.   You will be given sanitary pads to wear.  How can I expect to feel?   You may not feel the need to urinate for several hours after delivery.   You will have   some soreness and pain in your abdomen and vagina.   If you are breastfeeding, you may have uterine contractions every time you breastfeed for up to several weeks postpartum. Uterine contractions help your uterus return to its normal size.   It is normal to have vaginal bleeding (lochia) after delivery. The amount and appearance of lochia is often similar to a menstrual period in the first week after delivery. It will gradually decrease over the next few weeks to a dry, yellow-brown discharge. For most women, lochia stops completely by 6-8 weeks after delivery. Vaginal bleeding can vary from woman to woman.   Within the first few days after delivery, you may have breast engorgement. This is when your breasts feel heavy, full, and uncomfortable. Your breasts may also throb and feel hard, tightly stretched, warm, and tender. After this occurs, you may have milk leaking from your breasts.Your health care provider can help you relieve discomfort due to breast engorgement. Breast engorgement should go away within a few days.   You may feel more sad or worried  than normal due to hormonal changes after delivery. These feelings should not last more than a few days. If these feelings do not go away after several days, speak with your health care provider.  How should I care for myself?   Tell your health care provider if you have pain or discomfort.   Drink enough water to keep your urine clear or pale yellow.   Wash your hands thoroughly with soap and water for at least 20 seconds after changing your sanitary pads, after using the toilet, and before holding or feeding your baby.   If you are not breastfeeding, avoid touching your breasts a lot. Doing this can make your breasts produce more milk.   If you become weak or lightheaded, or you feel like you might faint, ask for help before:  ? Getting out of bed.  ? Showering.   Change your sanitary pads frequently. Watch for any changes in your flow, such as a sudden increase in volume, a change in color, the passing of large blood clots. If you pass a blood clot from your vagina, save it to show to your health care provider. Do not flush blood clots down the toilet without having your health care provider look at them.   Make sure that all your vaccinations are up to date. This can help protect you and your baby from getting certain diseases. You may need to have immunizations done before you leave the hospital.   If desired, talk with your health care provider about methods of family planning or birth control (contraception).  How can I start bonding with my baby?  Spending as much time as possible with your baby is very important. During this time, you and your baby can get to know each other and develop a bond. Having your baby stay with you in your room (rooming in) can give you time to get to know your baby. Rooming in can also help you become comfortable caring for your baby. Breastfeeding can also help you bond with your baby.  How can I plan for returning home with my baby?   Make sure that you have a car seat  installed in your vehicle.  ? Your car seat should be checked by a certified car seat installer to make sure that it is installed safely.  ? Make sure that your baby fits into the car seat safely.   Ask your health care

## 2018-01-09 ENCOUNTER — Inpatient Hospital Stay (HOSPITAL_COMMUNITY): Admission: RE | Admit: 2018-01-09 | Payer: Medicaid Other | Source: Ambulatory Visit

## 2018-02-27 IMAGING — US US OB COMP LESS 14 WK
1 series · 15 of 28 positions shown · non-contrast
Comparison: None.

CLINICAL DATA: Pelvic cramping in a pregnant patient. Patient is 8
weeks and 4 days pregnant based on her last menstrual period.

EXAM:
OBSTETRIC <14 WK US AND TRANSVAGINAL OB US
TECHNIQUE: Both transabdominal and transvaginal ultrasound examinations were
performed for complete evaluation of the gestation as well as the
maternal uterus, adnexal regions, and pelvic cul-de-sac.
Transvaginal technique was performed to assess early pregnancy.

[Series 1: us ob comp less 14 wk · 15 of 42 slices shown]
[im 1/42]
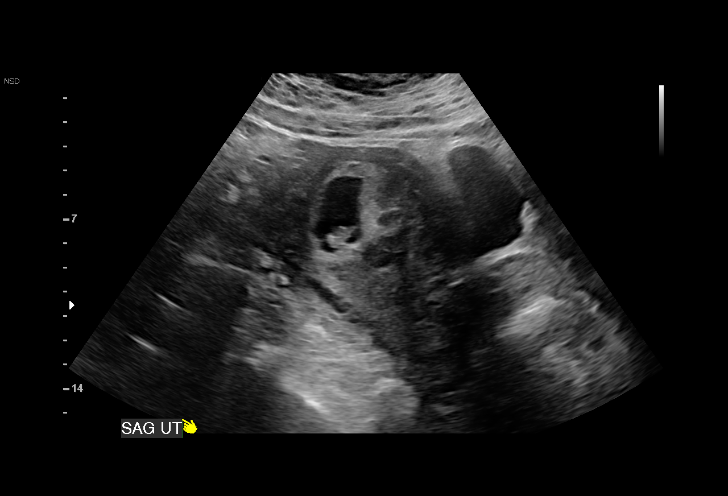
[im 4/42]
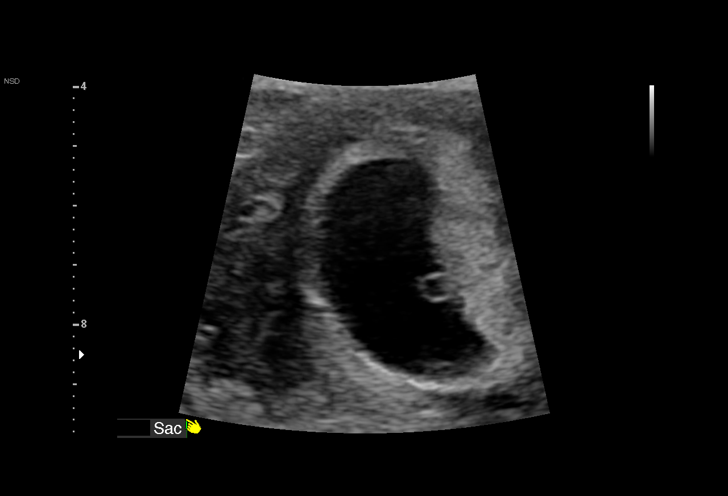
[im 7/42]
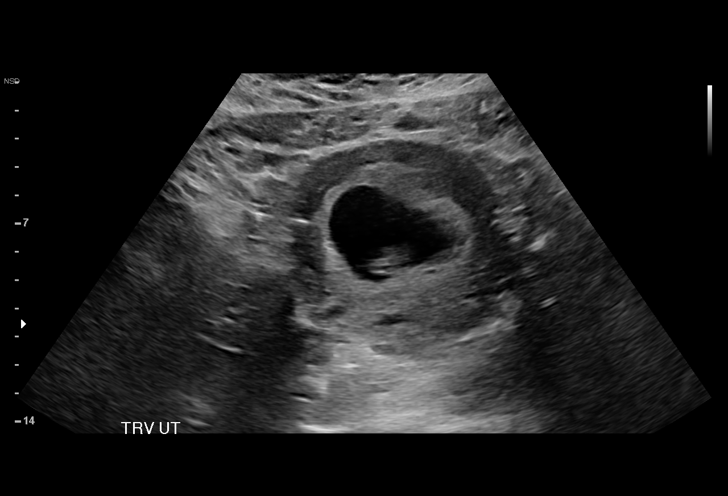
[im 10/42]
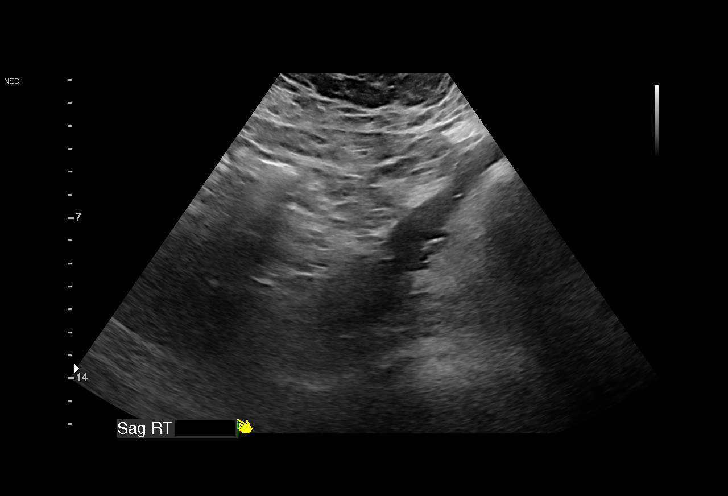
[im 13/42]
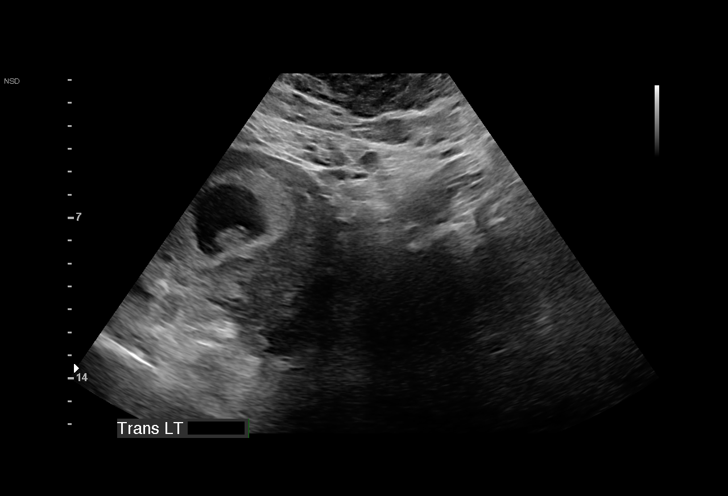
[im 16/42]
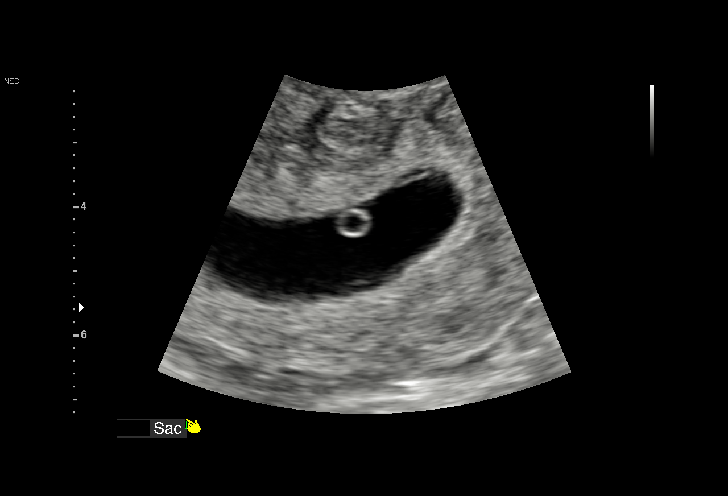
[im 19/42]
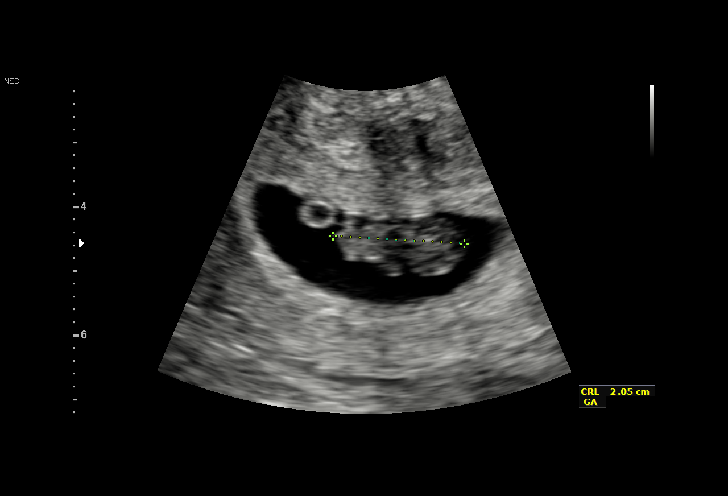
[im 22/42]
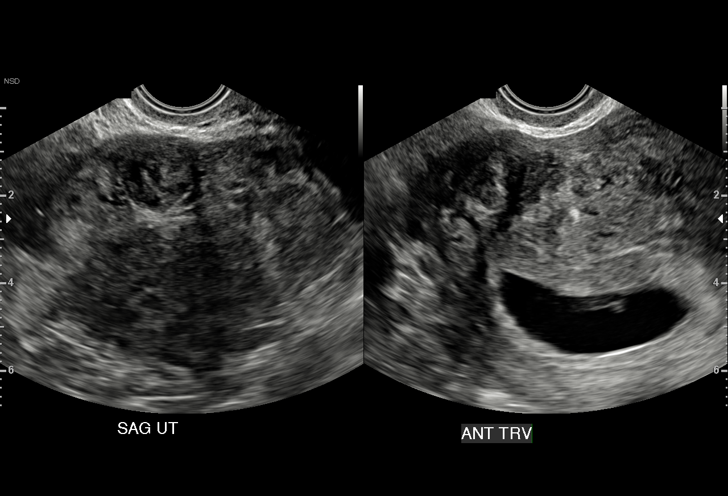
[im 23/42]
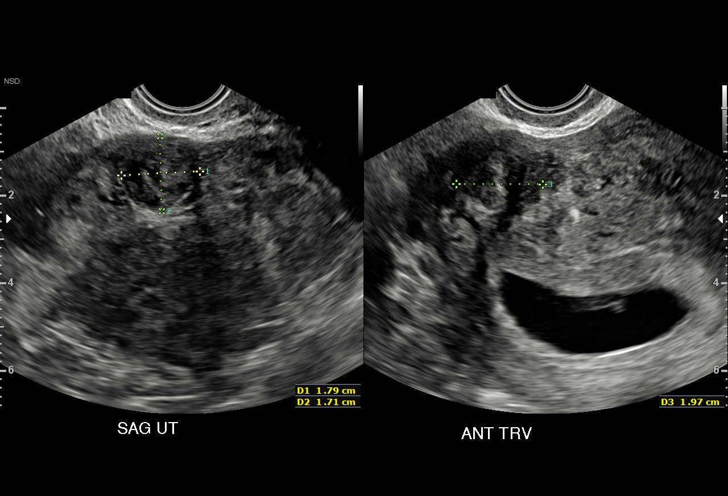
[im 26/42]
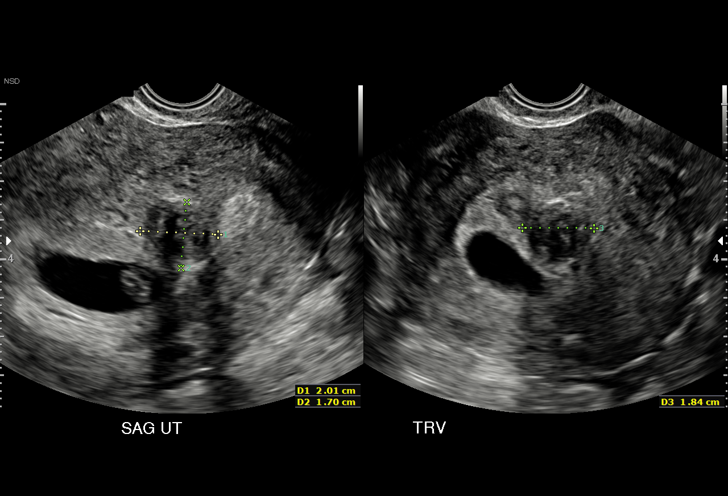
[im 29/42]
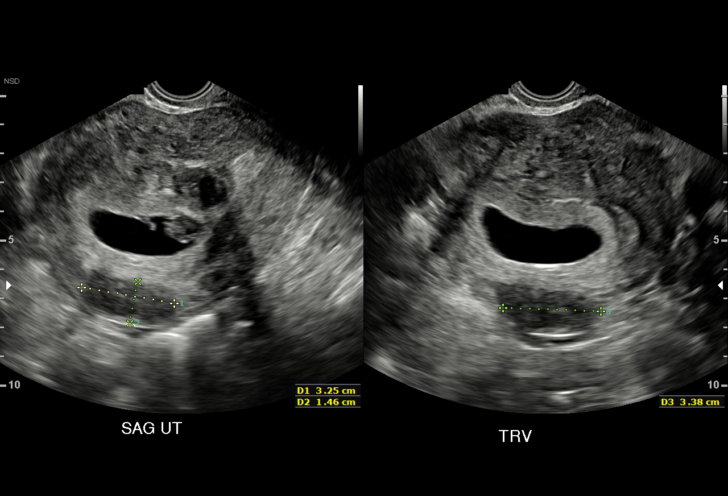
[im 32/42]
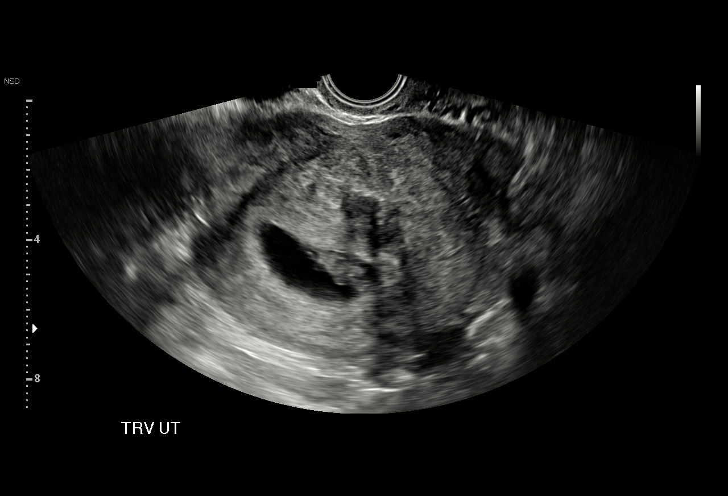
[im 35/42]
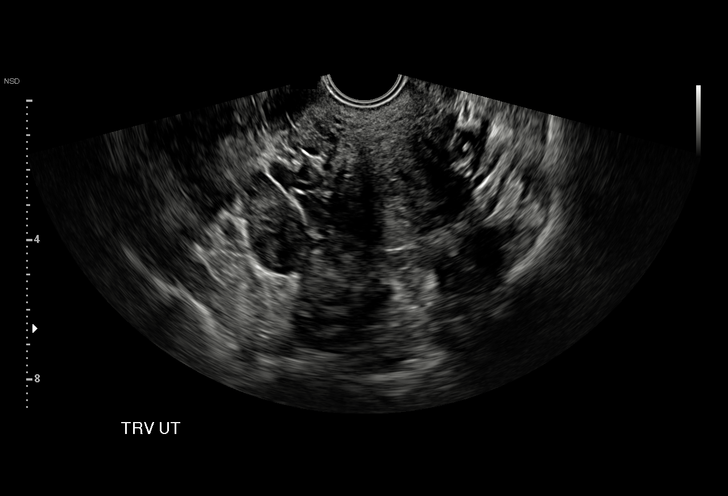
[im 38/42]
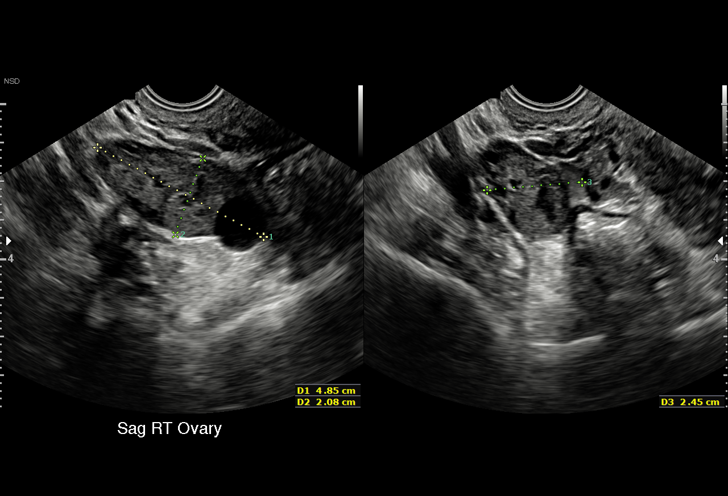
[im 42/42]
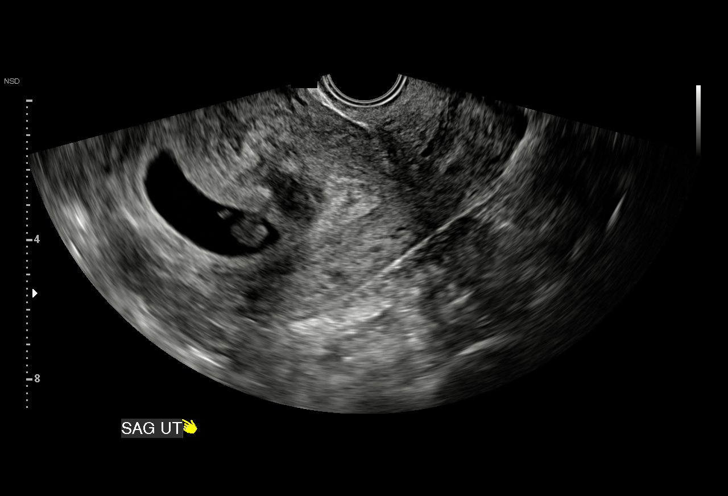

[15 of 28 positions shown; findings below may reference images not displayed]

FINDINGS: Intrauterine gestational sac: Single

Yolk sac:  Visualized.

Embryo:  Visualized.

Cardiac Activity: Visualized.

Heart Rate: 168  bpm

CRL:  20.5  mm   8 w   4 d                  US EDC: 01/09/2018

Subchorionic hemorrhage:  None visualized.

Maternal uterus/adnexae: Multiple small fibroids. There is anterior
1.8 x 1.7 x 2.0 cm fibroid. There is a posterior 3.3 x 1.5 x 3.4 cm
fibroid. In the mid uterine segment there is a 2.0 x 1.7 x 1.8 cm
fibroid. Both ovaries are visualized and are within normal limits.
No adnexal masses. No pelvic free fluid.
IMPRESSION: 1. Single live intrauterine pregnancy with a measured gestational
age of 8 weeks and 4 days consistent with the expected gestational
age based on the last menstrual period.
2. No emergent pregnancy or maternal complication.
3. Small uterine fibroids.
4. Normal ovaries and adnexa.

## 2018-05-11 ENCOUNTER — Other Ambulatory Visit: Payer: Self-pay

## 2018-05-11 ENCOUNTER — Emergency Department: Payer: Medicaid Other

## 2018-05-11 DIAGNOSIS — Z79899 Other long term (current) drug therapy: Secondary | ICD-10-CM | POA: Diagnosis not present

## 2018-05-11 DIAGNOSIS — E039 Hypothyroidism, unspecified: Secondary | ICD-10-CM | POA: Diagnosis not present

## 2018-05-11 DIAGNOSIS — Y33XXXA Other specified events, undetermined intent, initial encounter: Secondary | ICD-10-CM | POA: Diagnosis not present

## 2018-05-11 DIAGNOSIS — Z87891 Personal history of nicotine dependence: Secondary | ICD-10-CM | POA: Diagnosis not present

## 2018-05-11 DIAGNOSIS — Y939 Activity, unspecified: Secondary | ICD-10-CM | POA: Insufficient documentation

## 2018-05-11 DIAGNOSIS — M25551 Pain in right hip: Secondary | ICD-10-CM | POA: Diagnosis present

## 2018-05-11 DIAGNOSIS — Y929 Unspecified place or not applicable: Secondary | ICD-10-CM | POA: Insufficient documentation

## 2018-05-11 DIAGNOSIS — S76011A Strain of muscle, fascia and tendon of right hip, initial encounter: Secondary | ICD-10-CM | POA: Diagnosis not present

## 2018-05-11 DIAGNOSIS — Y998 Other external cause status: Secondary | ICD-10-CM | POA: Diagnosis not present

## 2018-05-11 DIAGNOSIS — M7601 Gluteal tendinitis, right hip: Secondary | ICD-10-CM | POA: Insufficient documentation

## 2018-05-11 LAB — POCT PREGNANCY, URINE: PREG TEST UR: NEGATIVE

## 2018-05-11 NOTE — ED Triage Notes (Signed)
Pt states had a vaginal delivery 3 months ago. Pt states has had right hip pain since delivery. Pt states tonight she is not able to bear weight on right leg.

## 2018-05-11 NOTE — ED Notes (Signed)
URINE PREGNANCY POC negative

## 2018-05-12 ENCOUNTER — Emergency Department
Admission: EM | Admit: 2018-05-12 | Discharge: 2018-05-12 | Disposition: A | Discharge status: Home / Self Care | Payer: Medicaid Other | Attending: Emergency Medicine | Admitting: Emergency Medicine

## 2018-05-12 ENCOUNTER — Emergency Department: Payer: Medicaid Other

## 2018-05-12 DIAGNOSIS — S76011A Strain of muscle, fascia and tendon of right hip, initial encounter: Secondary | ICD-10-CM

## 2018-05-12 DIAGNOSIS — M76891 Other specified enthesopathies of right lower limb, excluding foot: Secondary | ICD-10-CM

## 2018-05-12 MED ORDER — LIDOCAINE 5 % EX PTCH: 1.0000 | MEDICATED_PATCH | Freq: Two times a day (BID) | CUTANEOUS | 0 refills | Status: AC

## 2018-05-12 MED ORDER — OXYCODONE-ACETAMINOPHEN 5-325 MG PO TABS: 1.0000 | ORAL_TABLET | ORAL | 0 refills | Status: AC | PRN

## 2018-05-12 MED ORDER — ONDANSETRON 4 MG PO TBDP
4.0000 mg | ORAL_TABLET | Freq: Once | ORAL | Status: AC
  Administered 2018-05-12: 4 mg via ORAL
  Filled 2018-05-12: qty 1

## 2018-05-12 MED ORDER — LIDOCAINE 5 % EX PTCH
2.0000 | MEDICATED_PATCH | CUTANEOUS | Status: DC
  Administered 2018-05-12: 2 via TRANSDERMAL
  Filled 2018-05-12: qty 2

## 2018-05-12 MED ORDER — ONDANSETRON 4 MG PO TBDP: 4.0000 mg | ORAL_TABLET | Freq: Three times a day (TID) | ORAL | 0 refills | Status: DC | PRN

## 2018-05-12 MED ORDER — OXYCODONE-ACETAMINOPHEN 5-325 MG PO TABS
1.0000 | ORAL_TABLET | Freq: Once | ORAL | Status: AC
  Administered 2018-05-12: 1 via ORAL
  Filled 2018-05-12: qty 1

## 2018-05-12 NOTE — ED Notes (Signed)
Pt to MRI at this time.

## 2018-05-12 NOTE — ED Notes (Signed)
Pt ambulatory to BR with this RN with difficulty.

## 2018-05-12 NOTE — ED Provider Notes (Signed)
[facility] Emergency Department Provider Note    First MD Initiated Contact with Patient 05/12/18 0211     (approximate)  I have reviewed the triage vital signs and the nursing notes.   HISTORY  Chief Complaint Hip Pain   HPI Sharon Benson is a 42 y.o. female below list of chronic medical conditions presents to the emergency department with 10 out of 10 right hip which patient states began after giving birth to her daughter approximately 3 months ago.  Patient states that the pain is worse with any movement or bearing weight.  Patient states the pain became severe tonight.   Past Medical History:  Diagnosis Date  . Anxiety    panic attack - not recent 2001ish  . Arthritis   . Endometriosis   . Fibroid   . GERD (gastroesophageal reflux disease)   . Headache(784.0)   . Hypothyroidism   . Palpitations   . PONV (postoperative nausea and vomiting)    last 2- no vomiting-  Seizures- not with last 2.  . Seizures ([place])    last seizure 04/2017  . Seizures ([place])   . SVD (spontaneous vaginal delivery)    x 1    Patient Active Problem List   Diagnosis Date Noted  . Hypothyroidism 01/07/2018  . Rupture of membranes with delay of delivery 01/05/2018  . Obesity affecting pregnancy, antepartum, third trimester 01/05/2018  . SVD (spontaneous vaginal delivery) 01/05/2018  . Preterm labor in third trimester 11/17/2017  . Asthma affecting pregnancy, antepartum 08/03/2017  . SOB (shortness of breath) 08/03/2017  . Feeling of chest tightness 08/03/2017  . Fibroids 06/03/2017  . AMA (advanced maternal age) multigravida 35+ 06/03/2017  . 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 Morbid obesity with BMI of 45.0-49.9, adult ([place]) 06/03/2017  . Palpitations 11/09/2015  . Thyroid mass 01/02/2015  . GOITER, UNSPECIFIED 07/20/2010  . MIGRAINE HEADACHE 07/20/2010  . SEIZURE DISORDER, HX OF 07/20/2010    Past Surgical History:  Procedure Laterality  Date  . BREAST REDUCTION SURGERY    . BREAST SURGERY  1997  . DILATION AND CURETTAGE OF UTERUS    . DILATION AND EVACUATION N/A 11/25/2013   Procedure: DILATATION AND EVACUATION;  Surgeon: Woodroe Mode, MD;  Location: [facility];  Service: Gynecology;  Laterality: N/A;  . DILATION AND EVACUATION N/A 11/30/2013   Procedure: DILATATION AND EVACUATION;  Surgeon: Jonnie Kind, MD;  Location: [facility];  Service: Gynecology;  Laterality: N/A;  . DILATION AND EVACUATION N/A 05/02/2014   Procedure: DILATATION AND EVACUATION;  Surgeon: Ena Dawley, MD;  Location: [facility];  Service: Gynecology;  Laterality: N/A;  . GANGLION CYST EXCISION  1999   right hand  . THYROIDECTOMY  01/02/2015  . THYROIDECTOMY Bilateral 01/02/2015   Procedure: TOTAL THYROIDECTOMY;  Surgeon: Melissa Montane, MD;  Location: [facility];  Service: ENT;  Laterality: Bilateral;    Prior to Admission medications   Medication Sig Start Date End Date Taking? Authorizing Provider  albuterol (PROVENTIL HFA;VENTOLIN HFA) 108 (90 Base) MCG/ACT inhaler Inhale 2 puffs into the lungs every 4 (four) hours as needed for wheezing or shortness of breath. 12/10/16   Muthersbaugh, Jarrett Soho, PA-C  Cholecalciferol (VITAMIN D-3) 5000 UNITS TABS Take 10,000 Units by mouth every evening.     [provider]  ibuprofen (ADVIL,MOTRIN) 600 MG tablet Take 1 tablet (600 mg total) by mouth every 6 (six) hours as needed. 01/07/18   Donnel Saxon, CNM  IRON PO Take  1 tablet by mouth daily.    [provider]  levothyroxine (SYNTHROID, LEVOTHROID) 112 MCG tablet Take 224 mcg by mouth daily before breakfast.    [provider]  Prenatal Vit-Fe Fumarate-FA (MULTIVITAMIN-PRENATAL) 27-0.8 MG TABS tablet Take 1 tablet by mouth every evening.    [provider]  Spacer/Aero-Holding Chambers (AEROCHAMBER PLUS WITH MASK) inhaler Use as instructed 12/10/16   Muthersbaugh, Jarrett Soho, PA-C    Allergies Other; Cocoa butter; and Codeine  Family  History  Problem Relation Age of Onset  . Lung cancer Maternal Grandmother   . Colon cancer Maternal Grandmother   . Cancer Other   . Diabetes Other   . High blood pressure Other   . Diabetes Mother   . Hypertension Father   . Hypertension Paternal Grandmother   . Diabetes Paternal Grandmother   . Heart disease Paternal Grandmother     Social History Social History   Tobacco Use  . Smoking status: Former Smoker    Years: 2.00    Types: Cigarettes    Last attempt to quit: 04/28/1997    Years since quitting: 21.0  . Smokeless tobacco: Never Used  . Tobacco comment: smoked in HS  Substance Use Topics  . Alcohol use: No  . Drug use: No    Types: Marijuana    Comment: 18 yrs ago    Review of Systems Constitutional: No fever/chills Eyes: No visual changes. ENT: No sore throat. Cardiovascular: Denies chest pain. Respiratory: Denies shortness of breath. Gastrointestinal: No abdominal pain.  No nausea, no vomiting.  No diarrhea.  No constipation. Genitourinary: Negative for dysuria. Musculoskeletal: Negative for neck pain.  Positive for back and right hip pain  integumentary: Negative for rash. Neurological: Negative for headaches, focal weakness or numbness.   ____________________________________________   PHYSICAL EXAM:  VITAL SIGNS: ED Triage Vitals  Enc Vitals Group     BP 05/11/18 2206 129/84     Pulse Rate 05/11/18 2206 (!) 110     Resp 05/11/18 2206 18     Temp 05/11/18 2206 98.8 F (37.1 C)     Temp Source 05/11/18 2206 Oral     SpO2 05/11/18 2206 98 %     Weight 05/11/18 2206 117.9 kg (260 lb)     Height 05/11/18 2206 1.626 m (5\' 4" )     Head Circumference --      Peak Flow --      Pain Score 05/11/18 2208 10     Pain Loc --      Pain Edu? --      Excl. in [place]? --    Constitutional: Alert and oriented.  Apparent discomfort eyes: Conjunctivae are normal.  Head: Atraumatic. Mouth/Throat: Mucous membranes are moist.  Oropharynx  non-erythematous. Neck: No stridor. Cardiovascular: Normal rate, regular rhythm. Good peripheral circulation. Grossly normal heart sounds. Respiratory: Normal respiratory effort.  No retractions. Lungs CTAB. Gastrointestinal: Soft and nontender. No distention.  Musculoskeletal: Pain with lumbar spine palpation pain with right hip palpation.  Pain with active and passive range of motion right hip Neurologic:  Normal speech and language. No gross focal neurologic deficits are appreciated.  Skin:  Skin is warm, dry and intact. No rash noted. Psychiatric: Mood and affect are normal. Speech and behavior are normal.  ____________________________________________   LABS (all labs ordered are listed, but only abnormal results are displayed)  Labs Reviewed  POC URINE PREG, ED  POCT PREGNANCY, URINE    RADIOLOGY I, [place], personally viewed and evaluated  these images (plain radiographs) as part of my medical decision making, as well as reviewing the written report by the radiologist.  ED MD interpretation: MRI right hip consistent with muscle strain and tendinitis per radiologist.  Official radiology report(s): Mr Lumbar Spine Wo Contrast  Result Date: 05/12/2018 CLINICAL DATA:  42 y/o F; vaginal delivery 3 months ago. Right hip pain since delivery. EXAM: MRI LUMBAR SPINE WITHOUT CONTRAST TECHNIQUE: Multiplanar, multisequence MR imaging of the lumbar spine was performed. No intravenous contrast was administered. COMPARISON:  02/28/2015 lumbar spine MRI. FINDINGS: Segmentation:  Standard. Alignment:  Physiologic. Vertebrae: Mild L4-5 endplate degenerative edema. No fracture, diskitis, or suspicious bone lesion. Stable L3 vertebral low signal body bone island. Conus medullaris and cauda equina: Conus extends to the L1 level. Conus and cauda equina appear normal. Paraspinal and other soft tissues: Negative. Disc levels: L1-2: No significant disc displacement, foraminal stenosis, or canal  stenosis. L2-3: No significant disc displacement, foraminal stenosis, or canal stenosis. L3-4: Mild loss of intervertebral disc space height and small disc bulge. No significant foraminal or canal stenosis. L4-5: Progression of disc bulge and central protrusion. Mild bilateral foraminal stenosis. No significant canal stenosis. L5-S1: No significant disc displacement, foraminal stenosis, or canal stenosis. IMPRESSION: 1. No acute fracture or malalignment. 2. Mild progression of discogenic degenerative changes at the L4-5 level from 2016. Mild bilateral L4-5 foraminal stenosis. Electronically Signed   By: Kristine Garbe M.D.   On: 05/12/2018 04:46   Dg Hip Unilat W Or Wo Pelvis 2-3 Views Right  Result Date: 05/11/2018 CLINICAL DATA:  Chronic right hip pain. Unable to bear weight. EXAM: DG HIP (WITH OR WITHOUT PELVIS) 2-3V RIGHT COMPARISON:  None. FINDINGS: There is no evidence of fracture or dislocation. Both femoral heads are seated normally within their respective acetabula. The proximal right femur appears intact. No significant degenerative change is appreciated. The sacroiliac joints are unremarkable in appearance. The visualized bowel gas pattern is grossly unremarkable in appearance. An intrauterine device is noted overlying the mid pelvis. Scattered phleboliths are noted within the pelvis. IMPRESSION: No evidence of fracture or dislocation. Electronically Signed   By: Garald Balding M.D.   On: 05/11/2018 23:08    ______________________ Procedures   ____________________________________________   INITIAL IMPRESSION / ASSESSMENT AND PLAN / ED COURSE  As part of my medical decision making, I reviewed the following data within the [place] patch applied to the patient's lumbar spine and right hip Percocet given with improvement of pain.  Spoke with patient at length regarding all clinical findings and management at  home. ____________________________________________  FINAL CLINICAL IMPRESSION(S) / ED DIAGNOSES  Final diagnoses:  Right hip tendinitis  Hip strain, right, initial encounter     MEDICATIONS GIVEN DURING THIS VISIT:  Medications  lidocaine (LIDODERM) 5 % 2 patch (2 patches Transdermal Patch Applied 05/12/18 0330)  ondansetron (ZOFRAN-ODT) disintegrating tablet 4 mg (4 mg Oral Given 05/12/18 0521)  oxyCODONE-acetaminophen (PERCOCET/ROXICET) 5-325 MG per tablet 1 tablet (1 tablet Oral Given 05/12/18 9450)     ED Discharge Orders    None       Note:  This document was prepared using Dragon voice recognition software and may include unintentional dictation errors.    Gregor Hams, MD 05/12/18 [phone]

## 2018-12-28 ENCOUNTER — Emergency Department (HOSPITAL_COMMUNITY): Payer: Medicaid Other

## 2018-12-28 ENCOUNTER — Emergency Department (HOSPITAL_COMMUNITY)
Admission: EM | Admit: 2018-12-28 | Discharge: 2018-12-28 | Disposition: A | Discharge status: Home / Self Care | Payer: Medicaid Other | Attending: Emergency Medicine | Admitting: Emergency Medicine

## 2018-12-28 ENCOUNTER — Encounter (HOSPITAL_COMMUNITY): Payer: Self-pay | Admitting: Emergency Medicine

## 2018-12-28 DIAGNOSIS — S8992XA Unspecified injury of left lower leg, initial encounter: Secondary | ICD-10-CM | POA: Diagnosis present

## 2018-12-28 DIAGNOSIS — E039 Hypothyroidism, unspecified: Secondary | ICD-10-CM | POA: Diagnosis not present

## 2018-12-28 DIAGNOSIS — Z87891 Personal history of nicotine dependence: Secondary | ICD-10-CM | POA: Diagnosis not present

## 2018-12-28 DIAGNOSIS — S8392XA Sprain of unspecified site of left knee, initial encounter: Secondary | ICD-10-CM

## 2018-12-28 DIAGNOSIS — Y999 Unspecified external cause status: Secondary | ICD-10-CM | POA: Insufficient documentation

## 2018-12-28 DIAGNOSIS — Z79899 Other long term (current) drug therapy: Secondary | ICD-10-CM | POA: Diagnosis not present

## 2018-12-28 DIAGNOSIS — Y92003 Bedroom of unspecified non-institutional (private) residence as the place of occurrence of the external cause: Secondary | ICD-10-CM | POA: Diagnosis not present

## 2018-12-28 DIAGNOSIS — X500XXA Overexertion from strenuous movement or load, initial encounter: Secondary | ICD-10-CM | POA: Insufficient documentation

## 2018-12-28 DIAGNOSIS — Y9389 Activity, other specified: Secondary | ICD-10-CM | POA: Diagnosis not present

## 2018-12-28 MED ORDER — ACETAMINOPHEN 325 MG PO TABS: 650.0000 mg | ORAL_TABLET | Freq: Four times a day (QID) | ORAL | 0 refills | Status: DC | PRN

## 2018-12-28 MED ORDER — IBUPROFEN 200 MG PO TABS
600.0000 mg | ORAL_TABLET | Freq: Once | ORAL | Status: AC
  Administered 2018-12-28: 600 mg via ORAL
  Filled 2018-12-28: qty 3

## 2018-12-28 MED ORDER — DICLOFENAC SODIUM 1 % TD GEL: 2.0000 g | Freq: Four times a day (QID) | TRANSDERMAL | 0 refills | Status: DC

## 2018-12-28 MED ORDER — IBUPROFEN 600 MG PO TABS: 600.0000 mg | ORAL_TABLET | Freq: Four times a day (QID) | ORAL | 0 refills | Status: DC | PRN

## 2018-12-28 MED ORDER — ACETAMINOPHEN 325 MG PO TABS
650.0000 mg | ORAL_TABLET | Freq: Once | ORAL | Status: AC
  Administered 2018-12-28: 650 mg via ORAL
  Filled 2018-12-28: qty 2

## 2018-12-28 NOTE — ED Triage Notes (Signed)
Pt reports that baby was about to fall off bed when lounged to cath her and felt pop in left posterior leg above calf. C/o severe pains.

## 2018-12-28 NOTE — ED Provider Notes (Signed)
[facility] DEPT Provider Note   CSN: [id number] Arrival date & time: 12/28/18  1435     History   Chief Complaint Chief Complaint  Patient presents with  . Leg Pain    HPI Sharon Benson is a 43 y.o. female with a past medical history of arthritis who presents to ED for evaluation of posterior left knee pain that began prior to arrival.  States that she was on the opposite side of her king-size bed when she saw her 59-year-old daughter crawl to the edge of the bed.  In effort to prevent her from falling off the bed, patient lunged, dove across the entire bed to catch her daughter.  She felt a "pop" in her left posterior knee.  She is ambulated with pain since then.  She denies any prior fracture, dislocations or procedures in the area.  No symptoms prior to incident.  She has not taken any medication at home prior to arrival.  She denies any history of DVT, swelling of the leg, ankle pain, additional injuries. Patient is not currently breast-feeding.  HPI  Past Medical History:  Diagnosis Date  . Anxiety    panic attack - not recent 2001ish  . Arthritis   . Endometriosis   . Fibroid   . GERD (gastroesophageal reflux disease)   . Headache(784.0)   . Hypothyroidism   . Palpitations   . PONV (postoperative nausea and vomiting)    last 2- no vomiting-  Seizures- not with last 2.  . Seizures ([place])    last seizure 04/2017  . Seizures ([place])   . SVD (spontaneous vaginal delivery)    x 1    Patient Active Problem List   Diagnosis Date Noted  . Hypothyroidism 01/07/2018  . Rupture of membranes with delay of delivery 01/05/2018  . Obesity affecting pregnancy, antepartum, third trimester 01/05/2018  . SVD (spontaneous vaginal delivery) 01/05/2018  . Preterm labor in third trimester 11/17/2017  . Asthma affecting pregnancy, antepartum 08/03/2017  . SOB (shortness of breath) 08/03/2017  . Feeling of chest tightness 08/03/2017  . Fibroids  06/03/2017  . AMA (advanced maternal age) multigravida 35+ 06/03/2017  . 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 Morbid obesity with BMI of 45.0-49.9, adult ([place]) 06/03/2017  . Palpitations 11/09/2015  . Thyroid mass 01/02/2015  . GOITER, UNSPECIFIED 07/20/2010  . MIGRAINE HEADACHE 07/20/2010  . SEIZURE DISORDER, HX OF 07/20/2010    Past Surgical History:  Procedure Laterality Date  . BREAST REDUCTION SURGERY    . BREAST SURGERY  1997  . DILATION AND CURETTAGE OF UTERUS    . DILATION AND EVACUATION N/A 11/25/2013   Procedure: DILATATION AND EVACUATION;  Surgeon: Woodroe Mode, MD;  Location: [facility];  Service: Gynecology;  Laterality: N/A;  . DILATION AND EVACUATION N/A 11/30/2013   Procedure: DILATATION AND EVACUATION;  Surgeon: Jonnie Kind, MD;  Location: [facility];  Service: Gynecology;  Laterality: N/A;  . DILATION AND EVACUATION N/A 05/02/2014   Procedure: DILATATION AND EVACUATION;  Surgeon: Ena Dawley, MD;  Location: [facility];  Service: Gynecology;  Laterality: N/A;  . GANGLION CYST EXCISION  1999   right hand  . THYROIDECTOMY  01/02/2015  . THYROIDECTOMY Bilateral 01/02/2015   Procedure: TOTAL THYROIDECTOMY;  Surgeon: Melissa Montane, MD;  Location: [facility];  Service: ENT;  Laterality: Bilateral;     OB History    Gravida  5   Para  2   Term  2  Preterm      AB  3   Living  2     SAB  3   TAB      Ectopic      Multiple  0   Live Births  2            Home Medications    Prior to Admission medications   Medication Sig Start Date End Date Taking? Authorizing Provider  acetaminophen (TYLENOL) 325 MG tablet Take 2 tablets (650 mg total) by mouth every 6 (six) hours as needed. 12/28/18   Keon Pender, PA-C  albuterol (PROVENTIL HFA;VENTOLIN HFA) 108 (90 Base) MCG/ACT inhaler Inhale 2 puffs into the lungs every 4 (four) hours as needed for wheezing or shortness of breath. 12/10/16   Muthersbaugh, Jarrett Soho, PA-C  Cholecalciferol (VITAMIN D-3) 5000  UNITS TABS Take 10,000 Units by mouth every evening.     [provider]  diclofenac sodium (VOLTAREN) 1 % GEL Apply 2 g topically 4 (four) times daily. 12/28/18   Arwyn Besaw, PA-C  ibuprofen (ADVIL,MOTRIN) 600 MG tablet Take 1 tablet (600 mg total) by mouth every 6 (six) hours as needed. 12/28/18   Karyna Bessler, PA-C  IRON PO Take 1 tablet by mouth daily.    [provider]  levothyroxine (SYNTHROID, LEVOTHROID) 112 MCG tablet Take 224 mcg by mouth daily before breakfast.    [provider]  lidocaine (LIDODERM) 5 % Place 1 patch onto the skin every 12 (twelve) hours. Remove & Discard patch within 12 hours or as directed by MD 05/12/18 05/12/19  Gregor Hams, MD  ondansetron (ZOFRAN ODT) 4 MG disintegrating tablet Take 1 tablet (4 mg total) by mouth every 8 (eight) hours as needed for nausea or vomiting. 05/12/18   Gregor Hams, MD  oxyCODONE-acetaminophen (PERCOCET) 5-325 MG tablet Take 1 tablet by mouth every 4 (four) hours as needed for severe pain. 05/12/18 05/12/19  Gregor Hams, MD  Prenatal Vit-Fe Fumarate-FA (MULTIVITAMIN-PRENATAL) 27-0.8 MG TABS tablet Take 1 tablet by mouth every evening.    [provider]  Spacer/Aero-Holding Chambers (AEROCHAMBER PLUS WITH MASK) inhaler Use as instructed 12/10/16   Muthersbaugh, Jarrett Soho, PA-C    Family History Family History  Problem Relation Age of Onset  . Lung cancer Maternal Grandmother   . Colon cancer Maternal Grandmother   . Cancer Other   . Diabetes Other   . High blood pressure Other   . Diabetes Mother   . Hypertension Father   . Hypertension Paternal Grandmother   . Diabetes Paternal Grandmother   . Heart disease Paternal Grandmother     Social History Social History   Tobacco Use  . Smoking status: Former Smoker    Years: 2.00    Types: Cigarettes    Last attempt to quit: 04/28/1997    Years since quitting: 21.6  . Smokeless tobacco: Never Used  . Tobacco comment: smoked in HS    Substance Use Topics  . Alcohol use: No  . Drug use: No    Types: Marijuana    Comment: 18 yrs ago     Allergies   Other; Cocoa butter; and Codeine   Review of Systems Review of Systems  Constitutional: Negative for chills and fever.  Musculoskeletal: Positive for arthralgias. Negative for myalgias.  Neurological: Negative for weakness and numbness.     Physical Exam Updated Vital Signs BP 136/88 (BP Location: Right Arm)   Pulse 85   Temp 98.5 F (36.9 C) (Oral)  Resp 19   LMP 12/06/2018   SpO2 100%   Physical Exam Vitals signs and nursing note reviewed.  Constitutional:      General: She is not in acute distress.    Appearance: She is well-developed. She is not diaphoretic.  HENT:     Head: Normocephalic and atraumatic.  Eyes:     General: No scleral icterus.    Conjunctiva/sclera: Conjunctivae normal.  Neck:     Musculoskeletal: Normal range of motion.  Pulmonary:     Effort: Pulmonary effort is normal. No respiratory distress.  Musculoskeletal:        General: Tenderness present.     Comments: Tenderness of the left posterior knee down to calf.  Negative Thompson test.  Full active and passive range of motion of left ankle.  Pain with extension of the knee.  Able to flex and extend although pain reported.  2+ DP pulse palpated.  No erythema, edema noted.  Skin:    Findings: No rash.  Neurological:     Mental Status: She is alert.      ED Treatments / Results  Labs (all labs ordered are listed, but only abnormal results are displayed) Labs Reviewed - No data to display  EKG None  Radiology Dg Knee Complete 4 Views Left  Result Date: 12/28/2018 CLINICAL DATA:  Posterior knee pain. EXAM: LEFT KNEE - COMPLETE 4+ VIEW COMPARISON:  None. FINDINGS: No evidence of fracture, dislocation, or joint effusion. No evidence of arthropathy or other focal bone abnormality. Soft tissues are unremarkable. IMPRESSION: Negative. Electronically Signed   By: Lorriane Shire M.D.   On: 12/28/2018 17:15    Procedures Procedures (including critical care time)  Medications Ordered in ED Medications  ibuprofen (ADVIL,MOTRIN) tablet 600 mg (600 mg Oral Given 12/28/18 1624)  acetaminophen (TYLENOL) tablet 650 mg (650 mg Oral Given 12/28/18 1624)     Initial Impression / Assessment and Plan / ED Course  I have reviewed the triage vital signs and the nursing notes.  Pertinent labs & imaging results that were available during my care of the patient were reviewed by me and considered in my medical decision making (see chart for details).     43 year old female presents to ED for left knee pain.  She had to dive/lunge across her king-size bed in order to save her daughter from falling off the bed as she crawled.  She heard a popping sensation in the back of her knee.  She has been ambulatory with pain since then.  She has tenderness palpation of the popliteal area, down to the mid leg.  No erythema or edema noted.  Area is neurovascularly intact.  Negative Thompson test.  X-ray is unremarkable.  Suspect that symptoms are due to sprain.  We will have her apply Voltaren gel, Tylenol, ibuprofen and rice therapy.  Will advise her to return to ED for any severe worsening symptoms.  Patient is hemodynamically stable, in NAD, and able to ambulate in the ED. Evaluation does not show pathology that would require ongoing emergent intervention or inpatient treatment. I explained the diagnosis to the patient. Pain has been managed and has no complaints prior to discharge. Patient is comfortable with above plan and is stable for discharge at this time. All questions were answered prior to disposition. Strict return precautions for returning to the ED were discussed. Encouraged follow up with PCP.    Portions of this note were generated with [employer]. Dictation errors may occur despite best  attempts at proofreading.   Final Clinical Impressions(s) / ED  Diagnoses   Final diagnoses:  Sprain of left knee, unspecified ligament, initial encounter    ED Discharge Orders         Ordered    ibuprofen (ADVIL,MOTRIN) 600 MG tablet  Every 6 hours PRN     12/28/18 1754    acetaminophen (TYLENOL) 325 MG tablet  Every 6 hours PRN     12/28/18 1754    diclofenac sodium (VOLTAREN) 1 % GEL  4 times daily     12/28/18 1754           Delia Heady, PA-C 12/28/18 Barth Kirks, MD 12/28/18 2326

## 2018-12-28 NOTE — ED Notes (Signed)
Ice pack applied.

## 2018-12-28 NOTE — Discharge Instructions (Addendum)
Return to ED for worsening symptoms, injuries, swelling of the leg, numbness. Apply Voltaren gel to help with pain.  You will need to eventually follow-up with an orthopedic provider.

## 2019-05-24 ENCOUNTER — Other Ambulatory Visit: Payer: Self-pay | Admitting: Family Medicine

## 2019-05-24 DIAGNOSIS — Z1231 Encounter for screening mammogram for malignant neoplasm of breast: Secondary | ICD-10-CM

## 2019-06-03 ENCOUNTER — Other Ambulatory Visit: Payer: Self-pay | Admitting: Orthopedic Surgery

## 2019-06-03 DIAGNOSIS — M25562 Pain in left knee: Secondary | ICD-10-CM

## 2019-06-27 ENCOUNTER — Ambulatory Visit
Admission: RE | Admit: 2019-06-27 | Discharge: 2019-06-27 | Disposition: A | Discharge status: Home / Self Care | Payer: BC Managed Care – PPO | Source: Ambulatory Visit | Attending: Orthopedic Surgery | Admitting: Orthopedic Surgery

## 2019-06-27 ENCOUNTER — Other Ambulatory Visit: Payer: Self-pay

## 2019-06-27 DIAGNOSIS — M25562 Pain in left knee: Secondary | ICD-10-CM

## 2019-07-09 ENCOUNTER — Ambulatory Visit
Admission: RE | Admit: 2019-07-09 | Discharge: 2019-07-09 | Disposition: A | Discharge status: Home / Self Care | Payer: BC Managed Care – PPO | Source: Ambulatory Visit | Attending: Family Medicine | Admitting: Family Medicine

## 2019-07-09 ENCOUNTER — Other Ambulatory Visit: Payer: Self-pay

## 2019-07-09 DIAGNOSIS — Z1231 Encounter for screening mammogram for malignant neoplasm of breast: Secondary | ICD-10-CM

## 2019-08-13 ENCOUNTER — Encounter: Payer: Self-pay | Admitting: Physician Assistant

## 2019-08-13 ENCOUNTER — Encounter: Payer: Self-pay | Admitting: Gastroenterology

## 2019-08-26 ENCOUNTER — Ambulatory Visit: Payer: BC Managed Care – PPO | Admitting: Physician Assistant

## 2019-09-17 ENCOUNTER — Encounter: Payer: Self-pay | Admitting: Gastroenterology

## 2019-09-17 ENCOUNTER — Other Ambulatory Visit (INDEPENDENT_AMBULATORY_CARE_PROVIDER_SITE_OTHER): Payer: BC Managed Care – PPO

## 2019-09-17 ENCOUNTER — Ambulatory Visit (INDEPENDENT_AMBULATORY_CARE_PROVIDER_SITE_OTHER): Payer: BC Managed Care – PPO | Admitting: Gastroenterology

## 2019-09-17 VITALS — BP 120/72 | HR 99 | Temp 98.2°F | Ht 64.0 in | Wt 275.0 lb

## 2019-09-17 DIAGNOSIS — R1310 Dysphagia, unspecified: Secondary | ICD-10-CM

## 2019-09-17 DIAGNOSIS — R194 Change in bowel habit: Secondary | ICD-10-CM

## 2019-09-17 DIAGNOSIS — K219 Gastro-esophageal reflux disease without esophagitis: Secondary | ICD-10-CM

## 2019-09-17 DIAGNOSIS — R6889 Other general symptoms and signs: Secondary | ICD-10-CM

## 2019-09-17 DIAGNOSIS — D649 Anemia, unspecified: Secondary | ICD-10-CM | POA: Diagnosis not present

## 2019-09-17 DIAGNOSIS — R0989 Other specified symptoms and signs involving the circulatory and respiratory systems: Secondary | ICD-10-CM

## 2019-09-17 DIAGNOSIS — Z800 Family history of malignant neoplasm of digestive organs: Secondary | ICD-10-CM | POA: Diagnosis not present

## 2019-09-17 DIAGNOSIS — R1319 Other dysphagia: Secondary | ICD-10-CM

## 2019-09-17 LAB — IGA: IgA: 418 mg/dL — ABNORMAL HIGH (ref 68–378)

## 2019-09-17 LAB — CBC
HCT: 40.7 % (ref 36.0–46.0)
Hemoglobin: 13.3 g/dL (ref 12.0–15.0)
MCHC: 32.8 g/dL (ref 30.0–36.0)
MCV: 86.8 fl (ref 78.0–100.0)
Platelets: 245 10*3/uL (ref 150.0–400.0)
RBC: 4.69 Mil/uL (ref 3.87–5.11)
RDW: 16.4 % — ABNORMAL HIGH (ref 11.5–15.5)
WBC: 7.6 10*3/uL (ref 4.0–10.5)

## 2019-09-17 LAB — COMPREHENSIVE METABOLIC PANEL
ALT: 17 U/L (ref 0–35)
AST: 15 U/L (ref 0–37)
Albumin: 4.5 g/dL (ref 3.5–5.2)
Alkaline Phosphatase: 68 U/L (ref 39–117)
BUN: 14 mg/dL (ref 6–23)
CO2: 31 mEq/L (ref 19–32)
Calcium: 9.9 mg/dL (ref 8.4–10.5)
Chloride: 101 mEq/L (ref 96–112)
Creatinine, Ser: 1.05 mg/dL (ref 0.40–1.20)
GFR: 69.8 mL/min (ref >60.00)
Glucose, Bld: 77 mg/dL (ref 70–99)
Potassium: 4 mEq/L (ref 3.5–5.1)
Sodium: 138 mEq/L (ref 135–145)
Total Bilirubin: 0.4 mg/dL (ref 0.2–1.2)
Total Protein: 8.1 g/dL (ref 6.0–8.3)

## 2019-09-17 MED ORDER — PEG 3350-KCL-NA BICARB-NACL 420 G PO SOLR: 4000.0000 mL | Freq: Once | ORAL | 0 refills | Status: AC

## 2019-09-17 MED ORDER — ESOMEPRAZOLE MAGNESIUM 40 MG PO CPDR: 40.0000 mg | DELAYED_RELEASE_CAPSULE | Freq: Every evening | ORAL | 2 refills | Status: DC

## 2019-09-17 NOTE — Patient Instructions (Addendum)
We have sent the following medications to your pharmacy for you to pick up at your convenience: Nexium   Your provider has requested that you go to the basement level for lab work before leaving today. Press "B" on the elevator. The lab is located at the first door on the left as you exit the elevator.  You have been scheduled for an endoscopy and colonoscopy. Please follow the written instructions given to you at your visit today. Please pick up your prep supplies at the pharmacy within the next 1-3 days. If you use inhalers (even only as needed), please bring them with you on the day of your procedure.  Thank you for choosing me and [facility].  Dr. Rush Landmark

## 2019-09-18 ENCOUNTER — Encounter: Payer: Self-pay | Admitting: Gastroenterology

## 2019-09-18 LAB — TSH: TSH: 44.59 u[IU]/mL — ABNORMAL HIGH (ref 0.35–4.50)

## 2019-09-18 LAB — VITAMIN B12: Vitamin B-12: 285 pg/mL (ref 211–911)

## 2019-09-18 LAB — IBC + FERRITIN
Ferritin: 19.5 ng/mL (ref 10.0–291.0)
Iron: 48 ug/dL (ref 42–145)
Saturation Ratios: 10 % — ABNORMAL LOW (ref 20.0–50.0)
Transferrin: 342 mg/dL (ref 212.0–360.0)

## 2019-09-18 LAB — CALCIUM, IONIZED: Calcium, Ion: 5.21 mg/dL (ref 4.8–5.6)

## 2019-09-18 LAB — TISSUE TRANSGLUTAMINASE, IGA: (tTG) Ab, IgA: 1 U/mL

## 2019-09-18 LAB — FOLATE: Folate: 8.1 ng/mL (ref >5.9)

## 2019-09-18 NOTE — Progress Notes (Signed)
[facility] VISIT   Primary Care Provider Patient, No Pcp Per No address on file None  Referring Provider No referring provider defined for this encounter.   Patient Profile: Sharon Benson is a 43 y.o. female with a pmh significant for asthma, thyroid cancer (status post resection), obesity, seizures, ?esophageal narrowing status post dilation years ago, status post breast reduction, status post dilatation and curettage, family history of early colon cancer (Father).  The patient presents to the [facility] for an evaluation and management of problem(s) noted below:  Problem List 1. FHx: colon cancer   2. Gastroesophageal reflux disease, unspecified whether esophagitis present   3. Dysphagia, unspecified type   4. Change in bowel habit   5. Anemia, unspecified type   6. Throat clearing     History of Present Illness This is the patient's first visit to the outpatient [facility] clinic.  The patient has multiple issues that she would like to discuss.  She describes having acid reflux and difficulty swallowing for years.  For the last few months she has been having near daily issues of a sensation of foodstuffs getting caught.  Liquids have no problem.  She has a history of prior acid reflux and underwent an EGD in [place] within the last 10 to 15 years but does not recall the name of the provider.  She reports a prior dilation but does not recall how that was helpful or not.  Her weight has been increasing.  She has had interval periods of constipation and diarrhea.  Interestingly, her diarrhea symptoms are more profound around the time of her menstrual period.  During her last pregnancy she had worsened acid reflux.  Her father passed away from colon cancer at the age of 45.  A maternal grandmother also had colon cancer.  Maternal great aunts and uncles had multiple cancers including esophagus cancer, liver cancer, ovarian cancer,  pancreas cancer, prostate cancer.  She has never had a colonoscopy.  She denies any issues of melena or hematochezia.  The patient has never been on PPI therapy for an extended period of time.  She does not take any over-the-counter medications either.  GI Review of Systems Positive as above Negative for nausea, vomiting, change in appetite  Review of Systems General: Denies fevers/chills/weight loss HEENT: Denies oral lesions Cardiovascular: Denies chest pain/palpitations Pulmonary: Denies shortness of breath/nocturnal cough Gastroenterological: See HPI Genitourinary: Denies darkened urine Hematological: Denies easy bruising/bleeding Endocrine: Positive for issues of temperature intolerance Dermatological: Denies jaundice Psychological: Mood is stable   Medications Current Outpatient Medications  Medication Sig Dispense Refill   albuterol (PROVENTIL HFA;VENTOLIN HFA) 108 (90 Base) MCG/ACT inhaler Inhale 2 puffs into the lungs every 4 (four) hours as needed for wheezing or shortness of breath. 1 Inhaler 3   Cholecalciferol (VITAMIN D-3) 5000 UNITS TABS Take 10,000 Units by mouth every evening.      esomeprazole (NEXIUM) 40 MG capsule Take 1 capsule (40 mg total) by mouth every evening. 30 capsule 2   levonorgestrel (MIRENA) 20 MCG/24HR IUD 1 each by Intrauterine route once.     Thyroid 97.5 MG TABS Take 1 tablet by mouth 2 (two) times daily.     No current facility-administered medications for this visit.     Allergies Allergies  Allergen Reactions   Other Anaphylaxis and Itching    Cantaloupe  Makes her tongue swell   Cocoa Butter Hives   Codeine Other (See Comments)    seizures  Histories Past Medical History:  Diagnosis Date   Anxiety    panic attack - not recent 2001ish   Arthritis    Endometriosis    Fibroid    GERD (gastroesophageal reflux disease)    Headache(784.0)    Hypothyroidism    Palpitations    PONV (postoperative nausea and  vomiting)    last 2- no vomiting-  Seizures- not with last 2.   Seizures ([place])    last seizure 04/2017   Seizures ([place])    SVD (spontaneous vaginal delivery)    x 1   Past Surgical History:  Procedure Laterality Date   BREAST REDUCTION SURGERY     BREAST SURGERY  1997   DILATION AND CURETTAGE OF UTERUS     DILATION AND EVACUATION N/A 11/25/2013   Procedure: DILATATION AND EVACUATION;  Surgeon: Woodroe Mode, MD;  Location: [facility];  Service: Gynecology;  Laterality: N/A;   DILATION AND EVACUATION N/A 11/30/2013   Procedure: DILATATION AND EVACUATION;  Surgeon: Jonnie Kind, MD;  Location: [facility];  Service: Gynecology;  Laterality: N/A;   DILATION AND EVACUATION N/A 05/02/2014   Procedure: DILATATION AND EVACUATION;  Surgeon: Ena Dawley, MD;  Location: [facility];  Service: Gynecology;  Laterality: N/A;   GANGLION CYST EXCISION  1999   right hand   [place]     THYROIDECTOMY  01/02/2015   THYROIDECTOMY Bilateral 01/02/2015   Procedure: TOTAL THYROIDECTOMY;  Surgeon: Melissa Montane, MD;  Location: [facility];  Service: ENT;  Laterality: Bilateral;   Social History   Socioeconomic History   Marital status: Married    Spouse name: Not on file   Number of children: 4   Years of education: Not on file   Highest education level: Not on file  Occupational History   Occupation: [employer]er, fashion/clothing strain: Not on file   Food insecurity    Worry: Not on file    Inability: Not on file   Transportation needs    Medical: Not on file    Non-medical: Not on file  Tobacco Use   Smoking status: Former Smoker    Years: 2.00    Types: Cigarettes    Quit date: 04/28/1997    Years since quitting: 22.4   Smokeless tobacco: Never Used   Tobacco comment: smoked in HS  Substance and Sexual Activity   Alcohol use: No   Drug use: No    Types: Marijuana    Comment: 18 yrs ago   Sexual activity: Yes    Partners: Male    Birth control/protection:  I.U.D.  Lifestyle   Physical activity    Days per week: Not on file    Minutes per session: Not on file   Stress: Not on file  Relationships   Social connections    Talks on phone: Not on file    Gets together: Not on file    Attends religious service: Not on file    Active member of club or organization: Not on file    Attends meetings of clubs or organizations: Not on file    Relationship status: Not on file   Intimate partner violence    Fear of current or ex partner: Not on file    Emotionally abused: Not on file    Physically abused: Not on file    Forced sexual activity: Not on file  Other Topics Concern   Not on file  Social History Narrative   Not on file  Family History  Problem Relation Age of Onset   Lung cancer Maternal Grandmother    Colon cancer Maternal Grandmother    Cancer Other    Diabetes Other    High blood pressure Other    Diabetes Mother    Hypertension Father    Colon cancer Father        Dx at age 54 and it spread to his lungs, liver and bones   Hypertension Paternal Grandmother    Diabetes Paternal Grandmother    Heart disease Paternal Grandmother    Esophageal cancer Neg Hx    Liver disease Neg Hx    Pancreatic cancer Neg Hx    Stomach cancer Neg Hx    I have reviewed her medical, social, and family history in detail and updated the electronic medical record as necessary.    PHYSICAL EXAMINATION  BP 120/72    Pulse 99    Temp 98.2 F (36.8 C)    Ht '5\' 4"'  (1.626 m)    Wt 275 lb (124.7 kg)    BMI 47.20 kg/m  Wt Readings from Last 3 Encounters:  09/17/19 275 lb (124.7 kg)  05/11/18 260 lb (117.9 kg)  01/05/18 (!) 301 lb (136.5 kg)  GEN: NAD, appears stated age, doesn't appear chronically ill PSYCH: Cooperative, without pressured speech EYE: Conjunctivae pink, sclerae anicteric ENT: MMM, without oral ulcers, no erythema or exudates noted NECK: Supple CV: RR without R/Gs  RESP: CTAB posteriorly, without  wheezing GI: NABS, soft, rounded, protuberant, NT/ND, without rebound or guarding, unable to appreciate hepatosplenomegaly due to body habitus MSK/EXT: Bilateral lower extremity edema present SKIN: No jaundice NEURO:  Alert & Oriented x 3, no focal deficits   REVIEW OF DATA  I reviewed the following data at the time of this encounter:  GI Procedures and Studies  Reported EGD within the last 10 to 15 years in [place] and she will try to track down the records  Laboratory Studies  Reviewed those in epic  Imaging Studies  No relevant imaging studies to review   ASSESSMENT  Sharon Benson is a 43 y.o. female with a pmh significant for asthma, thyroid cancer (status post resection), obesity, seizures, ?esophageal narrowing status post dilation years ago, status post breast reduction, status post dilatation and curettage, family history of early colon cancer (Father).  The patient is seen today for evaluation and management of:  1. FHx: colon cancer   2. Gastroesophageal reflux disease, unspecified whether esophagitis present   3. Dysphagia, unspecified type   4. Change in bowel habit   5. Anemia, unspecified type   6. Throat clearing    The patient is hemodynamically stable.  She has multiple clinical issues.  She is going to benefit from an upper and lower endoscopy.  The upper endoscopy will help evaluate her symptoms of dysphagia and protracted GERD.  We will start her on PPI therapy to see how that helps but with her longstanding issues and her dysphagia we will need to proceed with an upper endoscopy.  Regards to her intermittent changes in bowel habits not clear to me that this is truly related to structural problems but we will evaluate that at the time of her screening colonoscopy which is considered a high risk screening colonoscopy due to her early family history of colon cancer in her father.  We will obtain laboratories to further evaluate her history of anemia that is noted  in the chart as well as her symptoms.  Further work-up and  management to be dictated by the findings of her upper and lower endoscopy.  The risks and benefits of endoscopic evaluation were discussed with the patient; these include but are not limited to the risk of perforation, infection, bleeding, missed lesions, lack of diagnosis, severe illness requiring hospitalization, as well as anesthesia and sedation related illnesses.  The patient is agreeable to proceed.  All patient questions were answered, to the best of my ability, and the patient agrees to the aforementioned plan of action with follow-up as indicated.   PLAN  Initiate Nexium 40 mg twice daily Laboratories as outlined below Diagnostic endoscopy (esophageal/gastric/duodenal biopsies to be obtained) plus dilation Screening colonoscopy high risk to be performed Query role of manometry after EGD with dilation     Orders Placed This Encounter  Procedures   CBC   Comp Met (CMET)   Calcium, ionized   TSH   IBC + Ferritin   B12   Folate   IgA   Tissue transglutaminase, IgA   Ambulatory referral to Gastroenterology    New Prescriptions   ESOMEPRAZOLE (NEXIUM) 40 MG CAPSULE    Take 1 capsule (40 mg total) by mouth every evening.   Modified Medications   No medications on file    Planned Follow Up No follow-ups on file.   Justice Britain, MD [facility] Advanced Endoscopy Office # [phone]

## 2019-09-19 ENCOUNTER — Telehealth: Payer: Self-pay | Admitting: Gastroenterology

## 2019-09-19 DIAGNOSIS — R6889 Other general symptoms and signs: Secondary | ICD-10-CM | POA: Insufficient documentation

## 2019-09-19 DIAGNOSIS — D649 Anemia, unspecified: Secondary | ICD-10-CM | POA: Insufficient documentation

## 2019-09-19 DIAGNOSIS — Z800 Family history of malignant neoplasm of digestive organs: Secondary | ICD-10-CM | POA: Insufficient documentation

## 2019-09-19 DIAGNOSIS — K219 Gastro-esophageal reflux disease without esophagitis: Secondary | ICD-10-CM | POA: Insufficient documentation

## 2019-09-19 DIAGNOSIS — R0989 Other specified symptoms and signs involving the circulatory and respiratory systems: Secondary | ICD-10-CM | POA: Insufficient documentation

## 2019-09-19 DIAGNOSIS — R1310 Dysphagia, unspecified: Secondary | ICD-10-CM | POA: Insufficient documentation

## 2019-09-19 DIAGNOSIS — R194 Change in bowel habit: Secondary | ICD-10-CM | POA: Insufficient documentation

## 2019-09-20 NOTE — Telephone Encounter (Signed)
Spoke to the patient and told the patient that when Dr. Rush Landmark has reviewed her results, she will be notified. The patient stated a detailed message could be left on her voicemail.

## 2019-09-23 ENCOUNTER — Other Ambulatory Visit: Payer: Self-pay

## 2019-09-23 DIAGNOSIS — D649 Anemia, unspecified: Secondary | ICD-10-CM

## 2019-09-23 MED ORDER — FERROUS GLUCONATE 324 (38 FE) MG PO TABS: 324.0000 mg | ORAL_TABLET | Freq: Every day | ORAL | 6 refills | Status: DC

## 2019-10-08 ENCOUNTER — Ambulatory Visit (INDEPENDENT_AMBULATORY_CARE_PROVIDER_SITE_OTHER)
Admission: EM | Admit: 2019-10-08 | Discharge: 2019-10-08 | Disposition: A | Discharge status: Home / Self Care | Payer: BC Managed Care – PPO | Source: Home / Self Care

## 2019-10-08 ENCOUNTER — Other Ambulatory Visit: Payer: Self-pay

## 2019-10-08 ENCOUNTER — Emergency Department (HOSPITAL_COMMUNITY)
Admission: EM | Admit: 2019-10-08 | Discharge: 2019-10-09 | Disposition: A | Discharge status: Home / Self Care | Payer: BC Managed Care – PPO | Attending: Emergency Medicine | Admitting: Emergency Medicine

## 2019-10-08 ENCOUNTER — Encounter (HOSPITAL_COMMUNITY): Payer: Self-pay | Admitting: Emergency Medicine

## 2019-10-08 ENCOUNTER — Encounter (HOSPITAL_COMMUNITY): Payer: Self-pay

## 2019-10-08 DIAGNOSIS — R1031 Right lower quadrant pain: Secondary | ICD-10-CM

## 2019-10-08 DIAGNOSIS — N938 Other specified abnormal uterine and vaginal bleeding: Secondary | ICD-10-CM | POA: Diagnosis not present

## 2019-10-08 DIAGNOSIS — Z79899 Other long term (current) drug therapy: Secondary | ICD-10-CM | POA: Diagnosis not present

## 2019-10-08 DIAGNOSIS — J45909 Unspecified asthma, uncomplicated: Secondary | ICD-10-CM | POA: Diagnosis not present

## 2019-10-08 DIAGNOSIS — D219 Benign neoplasm of connective and other soft tissue, unspecified: Secondary | ICD-10-CM | POA: Insufficient documentation

## 2019-10-08 DIAGNOSIS — Z87891 Personal history of nicotine dependence: Secondary | ICD-10-CM | POA: Insufficient documentation

## 2019-10-08 DIAGNOSIS — N939 Abnormal uterine and vaginal bleeding, unspecified: Secondary | ICD-10-CM

## 2019-10-08 DIAGNOSIS — E039 Hypothyroidism, unspecified: Secondary | ICD-10-CM | POA: Insufficient documentation

## 2019-10-08 DIAGNOSIS — R102 Pelvic and perineal pain: Secondary | ICD-10-CM

## 2019-10-08 LAB — I-STAT BETA HCG BLOOD, ED (MC, WL, AP ONLY): I-stat hCG, quantitative: <5 m[IU]/mL (ref <5)

## 2019-10-08 LAB — BASIC METABOLIC PANEL
Anion gap: 13 (ref 5–15)
BUN: 10 mg/dL (ref 6–20)
CO2: 24 mmol/L (ref 22–32)
Calcium: 9.5 mg/dL (ref 8.9–10.3)
Chloride: 97 mmol/L — ABNORMAL LOW (ref 98–111)
Creatinine, Ser: 0.86 mg/dL (ref 0.44–1.00)
GFR calc Af Amer: >60 mL/min (ref >60)
GFR calc non Af Amer: >60 mL/min (ref >60)
Glucose, Bld: 77 mg/dL (ref 70–99)
Potassium: 4 mmol/L (ref 3.5–5.1)
Sodium: 134 mmol/L — ABNORMAL LOW (ref 135–145)

## 2019-10-08 LAB — CBC WITH DIFFERENTIAL/PLATELET
Abs Immature Granulocytes: 0.03 10*3/uL (ref 0.00–0.07)
Basophils Absolute: 0 10*3/uL (ref 0.0–0.1)
Basophils Relative: 1 %
Eosinophils Absolute: 0.1 10*3/uL (ref 0.0–0.5)
Eosinophils Relative: 1 %
HCT: 43.4 % (ref 36.0–46.0)
Hemoglobin: 13.4 g/dL (ref 12.0–15.0)
Immature Granulocytes: 1 %
Lymphocytes Relative: 39 %
Lymphs Abs: 2.5 10*3/uL (ref 0.7–4.0)
MCH: 28 pg (ref 26.0–34.0)
MCHC: 30.9 g/dL (ref 30.0–36.0)
MCV: 90.8 fL (ref 80.0–100.0)
Monocytes Absolute: 0.5 10*3/uL (ref 0.1–1.0)
Monocytes Relative: 8 %
Neutro Abs: 3.3 10*3/uL (ref 1.7–7.7)
Neutrophils Relative %: 50 %
Platelets: 261 10*3/uL (ref 150–400)
RBC: 4.78 MIL/uL (ref 3.87–5.11)
RDW: 14.7 % (ref 11.5–15.5)
WBC: 6.4 10*3/uL (ref 4.0–10.5)
nRBC: 0 % (ref 0.0–0.2)

## 2019-10-08 LAB — URINALYSIS, ROUTINE W REFLEX MICROSCOPIC
Bacteria, UA: NONE SEEN
Bilirubin Urine: NEGATIVE
Glucose, UA: NEGATIVE mg/dL
Ketones, ur: 5 mg/dL — AB
Leukocytes,Ua: NEGATIVE
Nitrite: NEGATIVE
Protein, ur: NEGATIVE mg/dL
Specific Gravity, Urine: 1.016 (ref 1.005–1.030)
pH: 5 (ref 5.0–8.0)

## 2019-10-08 LAB — SAMPLE TO BLOOD BANK

## 2019-10-08 NOTE — ED Triage Notes (Signed)
Pt presents to UC w/ c/o vaginal bleeding since 10/4, lower right abdominal pain and lower back pain since then. Pt states pain is always there but worse at night.

## 2019-10-08 NOTE — ED Provider Notes (Signed)
[place]    CSN: [id number] Arrival date & time: 10/08/19  1813      History   Chief Complaint Chief Complaint  Patient presents with  . vaginal bleeding, lower abd pain    HPI Sharon Benson is a 43 y.o. female.   Patient presents with 10/10 acute RLQ and suprapubic abdominal pain.  She states the pain radiates around to her lower back.  She reports she has been bleeding vaginally x 23 days.  She denies fever or chills.  The history is provided by the patient.    Past Medical History:  Diagnosis Date  . Anxiety    panic attack - not recent 2001ish  . Arthritis   . Endometriosis   . Fibroid   . GERD (gastroesophageal reflux disease)   . Headache(784.0)   . Hypothyroidism   . Palpitations   . PONV (postoperative nausea and vomiting)    last 2- no vomiting-  Seizures- not with last 2.  . Seizures ([place])    last seizure 04/2017  . Seizures ([place])   . SVD (spontaneous vaginal delivery)    x 1    Patient Active Problem List   Diagnosis Date Noted  . FHx: colon cancer 09/19/2019  . Gastroesophageal reflux disease 09/19/2019  . Dysphagia 09/19/2019  . Change in bowel habit 09/19/2019  . Anemia 09/19/2019  . Throat clearing 09/19/2019  . Hypothyroidism 01/07/2018  . Rupture of membranes with delay of delivery 01/05/2018  . Obesity affecting pregnancy, antepartum, third trimester 01/05/2018  . SVD (spontaneous vaginal delivery) 01/05/2018  . Preterm labor in third trimester 11/17/2017  . Asthma affecting pregnancy, antepartum 08/03/2017  . SOB (shortness of breath) 08/03/2017  . Feeling of chest tightness 08/03/2017  . Fibroids 06/03/2017  . AMA (advanced maternal age) multigravida 35+ 06/03/2017  . 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 Morbid obesity with BMI of 45.0-49.9, adult ([place]) 06/03/2017  . Palpitations 11/09/2015  . Thyroid mass 01/02/2015  . GOITER, UNSPECIFIED 07/20/2010  . MIGRAINE HEADACHE 07/20/2010  . SEIZURE  DISORDER, HX OF 07/20/2010    Past Surgical History:  Procedure Laterality Date  . BREAST REDUCTION SURGERY    . BREAST SURGERY  1997  . DILATION AND CURETTAGE OF UTERUS    . DILATION AND EVACUATION N/A 11/25/2013   Procedure: DILATATION AND EVACUATION;  Surgeon: Woodroe Mode, MD;  Location: [facility];  Service: Gynecology;  Laterality: N/A;  . DILATION AND EVACUATION N/A 11/30/2013   Procedure: DILATATION AND EVACUATION;  Surgeon: Jonnie Kind, MD;  Location: [facility];  Service: Gynecology;  Laterality: N/A;  . DILATION AND EVACUATION N/A 05/02/2014   Procedure: DILATATION AND EVACUATION;  Surgeon: Ena Dawley, MD;  Location: [facility];  Service: Gynecology;  Laterality: N/A;  . GANGLION CYST EXCISION  1999   right hand  . mirena    . THYROIDECTOMY  01/02/2015  . THYROIDECTOMY Bilateral 01/02/2015   Procedure: TOTAL THYROIDECTOMY;  Surgeon: Melissa Montane, MD;  Location[facility];  Service: ENT;  Laterality: Bilateral;    OB History    Gravida  5   Para  2   Term  2   Preterm      AB  3   Living  2     SAB  3   TAB      Ectopic      Multiple  0   Live Births  2  Home Medications    Prior to Admission medications   Medication Sig Start Date End Date Taking? Authorizing Provider  vitamin k 100 MCG tablet Take 100 mcg by mouth daily.   Yes [provider]  albuterol (PROVENTIL HFA;VENTOLIN HFA) 108 (90 Base) MCG/ACT inhaler Inhale 2 puffs into the lungs every 4 (four) hours as needed for wheezing or shortness of breath. 12/10/16   Muthersbaugh, Jarrett Soho, PA-C  Cholecalciferol (VITAMIN D-3) 5000 UNITS TABS Take 10,000 Units by mouth every evening.     [provider]  esomeprazole (NEXIUM) 40 MG capsule Take 1 capsule (40 mg total) by mouth every evening. 09/17/19   Mansouraty, Telford Nab., MD  ferrous gluconate (FERGON) 324 MG tablet Take 1 tablet (324 mg total) by mouth daily with breakfast. 09/23/19 10/23/19  Mansouraty, Telford Nab., MD   levonorgestrel (MIRENA) 20 MCG/24HR IUD 1 each by Intrauterine route once.    [provider]  Thyroid 97.5 MG TABS Take 1 tablet by mouth 2 (two) times daily.    [provider]    Family History Family History  Problem Relation Age of Onset  . Lung cancer Maternal Grandmother   . Colon cancer Maternal Grandmother   . Cancer Other   . Diabetes Other   . High blood pressure Other   . Diabetes Mother   . Hypertension Father   . Colon cancer Father        Dx at age 36 and it spread to his lungs, liver and bones  . Hypertension Paternal Grandmother   . Diabetes Paternal Grandmother   . Heart disease Paternal Grandmother   . Esophageal cancer Neg Hx   . Liver disease Neg Hx   . Pancreatic cancer Neg Hx   . Stomach cancer Neg Hx     Social History Social History   Tobacco Use  . Smoking status: Former Smoker    Years: 2.00    Types: Cigarettes    Quit date: 04/28/1997    Years since quitting: 22.4  . Smokeless tobacco: Never Used  . Tobacco comment: smoked in HS  Substance Use Topics  . Alcohol use: No  . Drug use: No    Types: Marijuana    Comment: 18 yrs ago     Allergies   Other, Cocoa butter, and Codeine   Review of Systems Review of Systems  Constitutional: Negative for chills and fever.  HENT: Negative for ear pain and sore throat.   Eyes: Negative for pain and visual disturbance.  Respiratory: Negative for cough and shortness of breath.   Cardiovascular: Negative for chest pain and palpitations.  Gastrointestinal: Positive for abdominal pain. Negative for vomiting.  Genitourinary: Positive for vaginal bleeding. Negative for dysuria and hematuria.  Musculoskeletal: Positive for back pain. Negative for arthralgias.  Skin: Negative for color change and rash.  Neurological: Negative for seizures and syncope.  All other systems reviewed and are negative.    Physical Exam Triage Vital Signs ED Triage Vitals  Enc Vitals Group     BP  10/08/19 1917 126/71     Pulse Rate 10/08/19 1917 71     Resp 10/08/19 1917 16     Temp 10/08/19 1917 97.6 F (36.4 C)     Temp Source 10/08/19 1917 Tympanic     SpO2 10/08/19 1917 100 %     Weight --      Height --      Head Circumference --      Peak Flow --  Pain Score 10/08/19 1920 10     Pain Loc --      Pain Edu? --      Excl. in [place]? --    No data found.  Updated Vital Signs BP 126/71 (BP Location: Right Arm)   Pulse 71   Temp 97.6 F (36.4 C) (Tympanic)   Resp 16   LMP 09/15/2019   SpO2 100%   Visual Acuity Right Eye Distance:   Left Eye Distance:   Bilateral Distance:    Right Eye Near:   Left Eye Near:    Bilateral Near:     Physical Exam Vitals signs and nursing note reviewed.  Constitutional:      General: She is not in acute distress.    Appearance: She is well-developed.  HENT:     Head: Normocephalic and atraumatic.  Eyes:     Conjunctiva/sclera: Conjunctivae normal.  Neck:     Musculoskeletal: Neck supple.  Cardiovascular:     Rate and Rhythm: Normal rate and regular rhythm.     Heart sounds: No murmur.  Pulmonary:     Effort: Pulmonary effort is normal. No respiratory distress.     Breath sounds: Normal breath sounds.  Abdominal:     General: Bowel sounds are normal.     Palpations: Abdomen is soft.     Tenderness: There is abdominal tenderness. There is no guarding or rebound.     Comments: Acutely tender to palpation of suprapubic area and RLQ.  Skin:    General: Skin is warm and dry.  Neurological:     Mental Status: She is alert.      UC Treatments / Results  Labs (all labs ordered are listed, but only abnormal results are displayed) Labs Reviewed - No data to display  EKG   Radiology No results found.  Procedures Procedures (including critical care time)  Medications Ordered in UC Medications - No data to display  Initial Impression / Assessment and Plan / UC Course  I have reviewed the triage vital signs and  the nursing notes.  Pertinent labs & imaging results that were available during my care of the patient were reviewed by me and considered in my medical decision making (see chart for details).     Right lower quadrant abdominal pain, abnormal uterine bleeding.  Sending patient to the emergency department for evaluation due to her acutely tender abdomen.     Final Clinical Impressions(s) / UC Diagnoses   Final diagnoses:  Right lower quadrant abdominal pain  Abnormal uterine bleeding     Discharge Instructions     Go to the emergency department for evaluation of your acute abdominal pain and vaginal bleeding.    ED Prescriptions    None     PDMP not reviewed this encounter.   Sharion Balloon, NP 10/08/19 [phone]

## 2019-10-08 NOTE — ED Triage Notes (Signed)
Patient reports vaginal bleeding since Oct.4,2020 with low abdominal pain radiating to lower back . Denies fever or chills .

## 2019-10-08 NOTE — Discharge Instructions (Signed)
Go to the emergency department for evaluation of your acute abdominal pain and vaginal bleeding.

## 2019-10-09 ENCOUNTER — Emergency Department (HOSPITAL_COMMUNITY): Payer: BC Managed Care – PPO

## 2019-10-09 DIAGNOSIS — R102 Pelvic and perineal pain: Secondary | ICD-10-CM | POA: Diagnosis not present

## 2019-10-09 NOTE — ED Provider Notes (Signed)
[facility] EMERGENCY DEPARTMENT Provider Note   CSN: [id number] Arrival date & time: 10/08/19  1956     History   Chief Complaint Chief Complaint  Patient presents with   Vaginal Bleeding    HPI Sharon Benson is a 43 y.o. female.     The history is provided by the patient.  Vaginal Bleeding Quality:  Typical of menses Severity:  Moderate Onset quality:  Gradual Duration:  23 days Timing:  Constant Progression:  Waxing and waning Chronicity:  New Menstrual history:  Regular Possible pregnancy: no   Context: spontaneously   Relieved by:  None tried Worsened by:  Nothing Ineffective treatments:  None tried Associated symptoms: abdominal pain, back pain and dyspareunia   Associated symptoms: no dysuria, no fever, no nausea and no vaginal discharge   Associated symptoms comment:  Patient states she has had this right pelvic tenderness since the beginning of October when her menses started however last week the pain became so severe she was unable to stand up straight or walk.  The pain is not as bad now but still continues to hurt in her right pelvic area.  It is a sharp cramping type of pain that radiates into her back.  At times it is even painful for her to straighten her leg.  No fever, nausea, vomiting.  No urinary symptoms.  Patient is sexually active with only her husband for the last 7 years. Risk factors: hx of endometriosis and ovarian cysts     Past Medical History:  Diagnosis Date   Anxiety    panic attack - not recent 2001ish   Arthritis    Endometriosis    Fibroid    GERD (gastroesophageal reflux disease)    Headache(784.0)    Hypothyroidism    Palpitations    PONV (postoperative nausea and vomiting)    last 2- no vomiting-  Seizures- not with last 2.   Seizures ([place])    last seizure 04/2017   Seizures ([place])    SVD (spontaneous vaginal delivery)    x 1    Patient Active Problem List   Diagnosis Date Noted     FHx: colon cancer 09/19/2019   Gastroesophageal reflux disease 09/19/2019   Dysphagia 09/19/2019   Change in bowel habit 09/19/2019   Anemia 09/19/2019   Throat clearing 09/19/2019   Hypothyroidism 01/07/2018   Rupture of membranes with delay of delivery 01/05/2018   Obesity affecting pregnancy, antepartum, third trimester 01/05/2018   SVD (spontaneous vaginal delivery) 01/05/2018   Preterm labor in third trimester 11/17/2017   Asthma affecting pregnancy, antepartum 08/03/2017   SOB (shortness of breath) 08/03/2017   Feeling of chest tightness 08/03/2017   Fibroids 06/03/2017   AMA (advanced maternal age) multigravida 35+ 06/03/2017   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   Morbid obesity with BMI of 45.0-49.9, adult ([place]) 06/03/2017   Palpitations 11/09/2015   Thyroid mass 01/02/2015   GOITER, UNSPECIFIED 07/20/2010   MIGRAINE HEADACHE 07/20/2010   SEIZURE DISORDER, HX OF 07/20/2010    Past Surgical History:  Procedure Laterality Date   BREAST REDUCTION SURGERY     BREAST SURGERY  1997   DILATION AND CURETTAGE OF UTERUS     DILATION AND EVACUATION N/A 11/25/2013   Procedure: DILATATION AND EVACUATION;  Surgeon: Woodroe Mode, MD;  Location: [facility];  Service: Gynecology;  Laterality: N/A;   DILATION AND EVACUATION N/A 11/30/2013   Procedure: DILATATION AND EVACUATION;  Surgeon: Jonnie Kind, MD;  Location: [facility];  Service: Gynecology;  Laterality: N/A;   DILATION AND EVACUATION N/A 05/02/2014   Procedure: DILATATION AND EVACUATION;  Surgeon: Ena Dawley, MD;  Location: [facility];  Service: Gynecology;  Laterality: N/A;   GANGLION CYST EXCISION  1999   right hand   mirena     THYROIDECTOMY  01/02/2015   THYROIDECTOMY Bilateral 01/02/2015   Procedure: TOTAL THYROIDECTOMY;  Surgeon: Melissa Montane, MD;  Location: [facility];  Service: ENT;  Laterality: Bilateral;     OB History    Gravida  5   Para  2   Term  2   Preterm       AB  3   Living  2     SAB  3   TAB      Ectopic      Multiple  0   Live Births  2            Home Medications    Prior to Admission medications   Medication Sig Start Date End Date Taking? Authorizing Provider  albuterol (PROVENTIL HFA;VENTOLIN HFA) 108 (90 Base) MCG/ACT inhaler Inhale 2 puffs into the lungs every 4 (four) hours as needed for wheezing or shortness of breath. 12/10/16   Muthersbaugh, Jarrett Soho, PA-C  Cholecalciferol (VITAMIN D-3) 5000 UNITS TABS Take 10,000 Units by mouth every evening.     [provider]  esomeprazole (NEXIUM) 40 MG capsule Take 1 capsule (40 mg total) by mouth every evening. 09/17/19   Mansouraty, Telford Nab., MD  ferrous gluconate (FERGON) 324 MG tablet Take 1 tablet (324 mg total) by mouth daily with breakfast. 09/23/19 10/23/19  Mansouraty, Telford Nab., MD  levonorgestrel (MIRENA) 20 MCG/24HR IUD 1 each by Intrauterine route once.    [provider]  Thyroid 97.5 MG TABS Take 1 tablet by mouth 2 (two) times daily.    [provider]  vitamin k 100 MCG tablet Take 100 mcg by mouth daily.    [provider]    Family History Family History  Problem Relation Age of Onset   Lung cancer Maternal Grandmother    Colon cancer Maternal Grandmother    Cancer Other    Diabetes Other    High blood pressure Other    Diabetes Mother    Hypertension Father    Colon cancer Father        Dx at age 75 and it spread to his lungs, liver and bones   Hypertension Paternal Grandmother    Diabetes Paternal Grandmother    Heart disease Paternal Grandmother    Esophageal cancer Neg Hx    Liver disease Neg Hx    Pancreatic cancer Neg Hx    Stomach cancer Neg Hx     Social History Social History   Tobacco Use   Smoking status: Former Smoker    Years: 2.00    Types: Cigarettes    Quit date: 04/28/1997    Years since quitting: 22.4   Smokeless tobacco: Never Used   Tobacco comment: smoked in HS    Substance Use Topics   Alcohol use: No   Drug use: No    Types: Marijuana    Comment: 18 yrs ago     Allergies   Other, Cocoa butter, and Codeine   Review of Systems Review of Systems  Constitutional: Negative for fever.  Gastrointestinal: Positive for abdominal pain. Negative for nausea.  Genitourinary: Positive for dyspareunia and vaginal bleeding. Negative for dysuria and vaginal discharge.  Musculoskeletal: Positive  for back pain.  All other systems reviewed and are negative.    Physical Exam Updated Vital Signs BP 117/82 (BP Location: Left Arm)    Pulse 80    Temp 98.3 F (36.8 C) (Oral)    Resp 17    LMP 09/15/2019    SpO2 100%   Physical Exam Vitals signs and nursing note reviewed.  Constitutional:      General: She is not in acute distress.    Appearance: Normal appearance. She is well-developed. She is obese.  HENT:     Head: Normocephalic and atraumatic.  Eyes:     Conjunctiva/sclera: Conjunctivae normal.     Pupils: Pupils are equal, round, and reactive to light.  Neck:     Musculoskeletal: Normal range of motion and neck supple.  Cardiovascular:     Rate and Rhythm: Normal rate and regular rhythm.     Heart sounds: No murmur.  Pulmonary:     Effort: Pulmonary effort is normal. No respiratory distress.     Breath sounds: Normal breath sounds. No wheezing or rales.  Abdominal:     General: There is no distension.     Palpations: Abdomen is soft.     Tenderness: There is abdominal tenderness in the right lower quadrant. There is no guarding or rebound.    Musculoskeletal: Normal range of motion.        General: No tenderness.  Skin:    General: Skin is warm and dry.     Findings: No erythema or rash.  Neurological:     General: No focal deficit present.     Mental Status: She is alert and oriented to person, place, and time. Mental status is at baseline.  Psychiatric:        Mood and Affect: Mood normal.        Behavior: Behavior normal.         Thought Content: Thought content normal.      ED Treatments / Results  Labs (all labs ordered are listed, but only abnormal results are displayed) Labs Reviewed  URINALYSIS, ROUTINE W REFLEX MICROSCOPIC - Abnormal; Notable for the following components:      Result Value   APPearance HAZY (*)    Hgb urine dipstick MODERATE (*)    Ketones, ur 5 (*)    All other components within normal limits  BASIC METABOLIC PANEL - Abnormal; Notable for the following components:   Sodium 134 (*)    Chloride 97 (*)    All other components within normal limits  WET PREP, GENITAL  CBC WITH DIFFERENTIAL/PLATELET  I-STAT BETA HCG BLOOD, ED ([facility], [facility], [facility] ONLY)  SAMPLE TO BLOOD BANK  GC/CHLAMYDIA PROBE AMP ([place]) NOT AT [facility]    EKG None  Radiology US Pelvic Complete W Transvaginal And Torsion R/o  Result Date: 10/09/2019 CLINICAL DATA:  Pelvic pain EXAM: TRANSABDOMINAL AND TRANSVAGINAL ULTRASOUND OF PELVIS DOPPLER ULTRASOUND OF OVARIES TECHNIQUE: Both transabdominal and transvaginal ultrasound examinations of the pelvis were performed. Transabdominal technique was performed for global imaging of the pelvis including uterus, ovaries, adnexal regions, and pelvic cul-de-sac. It was necessary to proceed with endovaginal exam following the transabdominal exam to visualize the adnexa. Color and duplex Doppler ultrasound was utilized to evaluate blood flow to the ovaries. COMPARISON:  Obstetrical ultrasounds 06/05/2018 and 06/03/2017 FINDINGS: Uterus Measurements: 9.2 x 5.2 x 6.9 cm = volume: 170 mL. At least 3 discrete fibroids are present. Two are subserosal, measuring 2.9 x 1.9 x 2.6 and 1.4  x 1.1 x 1.3 cm respectively. An intramuscular fibroid anteriorly measures 1.4 x 1.5 x 1.5 cm. Endometrium Thickness: 10 mm. IUD is in place. The IUD is in the lower uterine segment. Right ovary Measurements: 3.3 x 1.8 x 2.2 cm = volume: 6.7 mL. Normal appearance/no adnexal mass. Left ovary Measurements: 3.3 x 2.3 x  3.2 cm = volume: 12.3 mL. A dominant follicle with a somewhat thickened rim measures 2.2 x 1.8 x 2.3 cm. Pulsed Doppler evaluation of both ovaries demonstrates normal low-resistance arterial and venous waveforms. Other findings No abnormal free fluid. IMPRESSION: 1. IUD is somewhat low within the lower uterine segment. 2. Dominant follicle the left ovary with a somewhat thickened wall. This may be a normal dominant follicle. Please correlate with pregnancy test for possible corpus luteal cyst. If pregnancy test is negative, the following consensus guidelines apply: This has benign characteristics, but given its size, follow up by ultrasound is recommended in 6 months. This recommendation follows consensus guidelines: Simple Adnexal Cysts: SRU Consensus Conference Update on Follow-up and Reporting. Radiology 2019; [id number]. 3. At least 3 uterine fibroids as described. Electronically Signed   By: San Morelle M.D.   On: 10/09/2019 11:20    Procedures Procedures (including critical care time)  Medications Ordered in ED Medications - No data to display   Initial Impression / Assessment and Plan / ED Course  I have reviewed the triage vital signs and the nursing notes.  Pertinent labs & imaging results that were available during my care of the patient were reviewed by me and considered in my medical decision making (see chart for details).        Patient with dysfunctional uterine bleeding this month for a total of 23 days which she describes as a.  Most days but does wax and wane.  The bleeding is starting to improve but she continues to have this right pelvic pain.  The pain is been present for the last month but last week became severe.  Now the pain is constant but not as severe as it was.  She can still function with it but is starting to get concerned because the pain is not going away.  She has no infectious symptoms or urinary symptoms.  She has right pelvic pain and concern for  potential ovarian pathology.  Lower suspicion for TOA, PID and hCG is negative with low suspicion for ectopic.  Ultrasound pending for further evaluation.  Hemoglobin is stable despite 23 days of bleeding.  Platelet count is normal  11:33 AM  Patient's pelvic ultrasound shows IUD is somewhat low within the uterine segment and there is a dominant follicle in the left ovary with a somewhat thickened wall.  Patient's pregnancy test is negative and they recommend follow-up ultrasound in 6 months.  She also has multiple fibroids.  This could be the result of her dysfunctional uterine bleeding and pain.  Offered patient pelvic exam after she returned from ultrasound but she declines.  Low suspicion for STI at this time and do not feel that we would offer further benefit at this time.  Low suspicion for appendicitis or intra-abdominal pathology.  Patient has follow-up with [facility] on November 13 Final Clinical Impressions(s) / ED Diagnoses   Final diagnoses:  Pelvic pain  Dysfunctional uterine bleeding  Fibroids    ED Discharge Orders    None       Blanchie Dessert, MD 10/09/19 1136

## 2019-10-09 NOTE — ED Notes (Signed)
Pt transported to ultrasound.

## 2019-10-09 NOTE — ED Notes (Signed)
Patient verbalizes understanding of discharge instructions . Opportunity for questions and answers were provided . Armband removed by staff ,Pt discharged from ED. W/C  offered at D/C  and Declined W/C at D/C and was escorted to lobby by RN.  

## 2019-10-25 ENCOUNTER — Encounter: Payer: Self-pay | Admitting: Gastroenterology

## 2019-10-25 ENCOUNTER — Ambulatory Visit (AMBULATORY_SURGERY_CENTER): Payer: BC Managed Care – PPO | Admitting: Gastroenterology

## 2019-10-25 ENCOUNTER — Other Ambulatory Visit: Payer: Self-pay

## 2019-10-25 VITALS — BP 139/87 | HR 79 | Temp 98.6°F | Resp 16 | Ht 64.0 in | Wt 275.0 lb

## 2019-10-25 DIAGNOSIS — K635 Polyp of colon: Secondary | ICD-10-CM

## 2019-10-25 DIAGNOSIS — K3189 Other diseases of stomach and duodenum: Secondary | ICD-10-CM | POA: Diagnosis not present

## 2019-10-25 DIAGNOSIS — K219 Gastro-esophageal reflux disease without esophagitis: Secondary | ICD-10-CM | POA: Diagnosis not present

## 2019-10-25 DIAGNOSIS — K641 Second degree hemorrhoids: Secondary | ICD-10-CM | POA: Diagnosis not present

## 2019-10-25 DIAGNOSIS — R1310 Dysphagia, unspecified: Secondary | ICD-10-CM

## 2019-10-25 DIAGNOSIS — K317 Polyp of stomach and duodenum: Secondary | ICD-10-CM

## 2019-10-25 DIAGNOSIS — D127 Benign neoplasm of rectosigmoid junction: Secondary | ICD-10-CM

## 2019-10-25 DIAGNOSIS — K449 Diaphragmatic hernia without obstruction or gangrene: Secondary | ICD-10-CM | POA: Diagnosis not present

## 2019-10-25 DIAGNOSIS — Z800 Family history of malignant neoplasm of digestive organs: Secondary | ICD-10-CM

## 2019-10-25 DIAGNOSIS — R1319 Other dysphagia: Secondary | ICD-10-CM

## 2019-10-25 MED ORDER — SODIUM CHLORIDE 0.9 % IV SOLN: 500.0000 mL | Freq: Once | INTRAVENOUS | Status: DC

## 2019-10-25 NOTE — Patient Instructions (Signed)
Handouts given for hiatal hernia, gastritis, polyps and high fiber diet.  YOU HAD AN ENDOSCOPIC PROCEDURE TODAY AT [facility]:   Refer to the procedure report that was given to you for any specific questions about what was found during the examination.  If the procedure report does not answer your questions, please call your gastroenterologist to clarify.  If you requested that your care partner not be given the details of your procedure findings, then the procedure report has been included in a sealed envelope for you to review at your convenience later.  YOU SHOULD EXPECT: Some feelings of bloating in the abdomen. Passage of more gas than usual.  Walking can help get rid of the air that was put into your GI tract during the procedure and reduce the bloating. If you had a lower endoscopy (such as a colonoscopy or flexible sigmoidoscopy) you may notice spotting of blood in your stool or on the toilet paper. If you underwent a bowel prep for your procedure, you may not have a normal bowel movement for a few days.  Please Note:  You might notice some irritation and congestion in your nose or some drainage.  This is from the oxygen used during your procedure.  There is no need for concern and it should clear up in a day or so.  SYMPTOMS TO REPORT IMMEDIATELY:   Following lower endoscopy (colonoscopy or flexible sigmoidoscopy):  Excessive amounts of blood in the stool  Significant tenderness or worsening of abdominal pains  Swelling of the abdomen that is new, acute  Fever of 100F or higher   Following upper endoscopy (EGD)  Vomiting of blood or coffee ground material  New chest pain or pain under the shoulder blades  Painful or persistently difficult swallowing  New shortness of breath  Fever of 100F or higher  Black, tarry-looking stools  For urgent or emergent issues, a gastroenterologist can be reached at any hour by calling [phone].   DIET:  We do recommend a  small meal at first, liquids and then soft foods today, then you may proceed to your regular diet.  Drink plenty of fluids but you should avoid alcoholic beverages for 24 hours.  ACTIVITY:  You should plan to take it easy for the rest of today and you should NOT DRIVE or use heavy machinery until tomorrow (because of the sedation medicines used during the test).    FOLLOW UP: Our staff will call the number listed on your records 48-72 hours following your procedure to check on you and address any questions or concerns that you may have regarding the information given to you following your procedure. If we do not reach you, we will leave a message.  We will attempt to reach you two times.  During this call, we will ask if you have developed any symptoms of COVID 19. If you develop any symptoms (ie: fever, flu-like symptoms, shortness of breath, cough etc.) before then, please call [phone].  If you test positive for Covid 19 in the 2 weeks post procedure, please call and report this information to [name].    If any biopsies were taken you will be contacted by phone or by letter within the next 1-3 weeks.  Please call us at [phone] if you have not heard about the biopsies in 3 weeks.    SIGNATURES/CONFIDENTIALITY: You and/or your care partner have signed paperwork which will be entered into your electronic medical record.  These signatures attest to  the fact that that the information above on your After Visit Summary has been reviewed and is understood.  Full responsibility of the confidentiality of this discharge information lies with you and/or your care-partner.

## 2019-10-25 NOTE — Progress Notes (Signed)
Called to room to assist during endoscopic procedure.  Patient ID and intended procedure confirmed with present staff. Received instructions for my participation in the procedure from the performing physician.  

## 2019-10-25 NOTE — Progress Notes (Signed)
Pt tolerated well. VSS. Awake and to recovery. 

## 2019-10-25 NOTE — Progress Notes (Signed)
VS by CW. Temp by LC. 

## 2019-10-25 NOTE — Op Note (Signed)
[facility] Patient Name: Sharon Benson Procedure Date: 10/25/2019 10:42 AM MRN: [id number] Endoscopist: Justice Britain , MD Age: 43 Referring MD:  Date of Birth: [birth year] Gender: Female Account #: [id number] Procedure:                Colonoscopy Indications:              Screening in patient at increased risk: Colorectal                            cancer in father before age 28, Incidental - Lower                            abdominal pain Medicines:                Monitored Anesthesia Care Procedure:                Pre-Anesthesia Assessment:                           - Prior to the procedure, a History and Physical                            was performed, and patient medications and                            allergies were reviewed. The patient's tolerance of                            previous anesthesia was also reviewed. The risks                            and benefits of the procedure and the sedation                            options and risks were discussed with the patient.                            All questions were answered, and informed consent                            was obtained. Prior Anticoagulants: The patient has                            taken no previous anticoagulant or antiplatelet                            agents. ASA Grade Assessment: II - A patient with                            mild systemic disease. After reviewing the risks                            and benefits, the patient was deemed in  satisfactory condition to undergo the procedure.                           After obtaining informed consent, the colonoscope                            was passed under direct vision. Throughout the                            procedure, the patient's blood pressure, pulse, and                            oxygen saturations were monitored continuously. The                            Colonoscope was introduced through  the anus and                            advanced to the 5 cm into the ileum. The                            colonoscopy was performed without difficulty. The                            patient tolerated the procedure. The quality of the                            bowel preparation was good. The terminal ileum,                            ileocecal valve, appendiceal orifice, and rectum                            were photographed. Scope In: 11:17:22 AM Scope Out: 11:32:00 AM Scope Withdrawal Time: 0 hours 10 minutes 28 seconds  Total Procedure Duration: 0 hours 14 minutes 38 seconds  Findings:                 The digital rectal exam findings include                            hemorrhoids. Pertinent negatives include no                            palpable rectal lesions.                           The terminal ileum and ileocecal valve appeared                            normal.                           A few sessile polyps were found in the  recto-sigmoid colon. The polyps were 1 to 3 mm in                            size. Two of these were biopsied with a cold                            forceps for histology to rule out adenomatous                            tissue.                           Normal mucosa was found in the entire colon                            otherwise.                           Non-bleeding non-thrombosed internal hemorrhoids                            were found during retroflexion, during perianal                            exam and during digital exam. The hemorrhoids were                            Grade II (internal hemorrhoids that prolapse but                            reduce spontaneously). Complications:            No immediate complications. Estimated Blood Loss:     Estimated blood loss was minimal. Impression:               - Hemorrhoids found on digital rectal exam.                           - The examined portion of the ileum  was normal.                           - A few 1 to 3 mm polyps at the recto-sigmoid                            colon. Biopsied.                           - Normal mucosa in the entire examined colon                            otherwise.                           - Non-bleeding non-thrombosed internal hemorrhoids. Recommendation:           - The patient will be observed post-procedure,  until all discharge criteria are met.                           - Discharge patient to home.                           - Patient has a contact number available for                            emergencies. The signs and symptoms of potential                            delayed complications were discussed with the                            patient. Return to normal activities tomorrow.                            Written discharge instructions were provided to the                            patient.                           - High fiber diet.                           - Use FiberCon 1 tablet PO daily.                           - Continue present medications.                           - Await pathology results. If evidence of the                            hyperplastic appearing polyps as adenomatous tissue                            will need a Flexible sigmoidoscopy within 1 year.                           - Repeat colonoscopy in 5 years for screening                            purposes no matter pathology due to family history                            of early colon cancer.                           - The findings and recommendations were discussed                            with the patient. Justice Britain, MD 10/25/2019 12:03:12 PM

## 2019-10-25 NOTE — Op Note (Signed)
Sharon Benson: Sharon Benson Procedure Date: 10/25/2019 10:41 AM MRN: [id number] Endoscopist: Justice Britain , MD Age: 43 Referring MD:  Date of Birth: June 25, 1976 Gender: Female Account #: [id number] Procedure:                Upper GI endoscopy Indications:              Dysphagia, Heartburn, Gastro-esophageal reflux                            disease, Follow-up of gastro-esophageal reflux                            disease, Sore throat Medicines:                Monitored Anesthesia Care Procedure:                Pre-Anesthesia Assessment:                           - Prior to the procedure, a History and Physical                            was performed, and patient medications and                            allergies were reviewed. The patient's tolerance of                            previous anesthesia was also reviewed. The risks                            and benefits of the procedure and the sedation                            options and risks were discussed with the patient.                            All questions were answered, and informed consent                            was obtained. Prior Anticoagulants: The patient has                            taken no previous anticoagulant or antiplatelet                            agents. ASA Grade Assessment: II - A patient with                            mild systemic disease. After reviewing the risks                            and benefits, the patient was deemed in  satisfactory condition to undergo the procedure.                           After obtaining informed consent, the endoscope was                            passed under direct vision. Throughout the                            procedure, the patient's blood pressure, pulse, and                            oxygen saturations were monitored continuously. The                            Endoscope was introduced through  the mouth, and                            advanced to the second part of duodenum. The upper                            GI endoscopy was accomplished without difficulty.                            The patient tolerated the procedure. Scope In: Scope Out: Findings:                 No gross lesions were noted in the entire                            esophagus. Biopsies were taken with a cold forceps                            for histology to rule out EoE. A guidewire was                            placed and the scope was withdrawn. Dilation was                            performed with a Savary dilator with no resistance                            at 16 mm and 17 mm and mild resistance at 18 mm.                            The dilation site was examined following endoscope                            reinsertion and showed no change.                           The Z-line was regular and was found 38 cm from the  incisors.                           A small hiatal hernia was present (2 cm).                           A few small semi-sessile polyps with no bleeding                            and no stigmata of recent bleeding were found in                            the gastric body. Biopsies were taken with a cold                            forceps for histology to confirm fundic gland                            polyps and rule out adenoma.                           Patchy mildly erythematous mucosa without bleeding                            was found in the gastric antrum.                           No other gross lesions were noted in the entire                            examined stomach. Biopsies were taken with a cold                            forceps for histology and Helicobacter pylori                            testing.                           No gross lesions were noted in the duodenal bulb,                            in the first portion of the duodenum  and in the                            second portion of the duodenum. Biopsies were taken                            with a cold forceps for histology to rule out                            Celiac disease and Enteropathy. Complications:            No immediate complications. Estimated Blood Loss:  Estimated blood loss was minimal. Impression:               - No gross lesions in esophagus. Biopsied. Dilated.                           - Z-line regular, 38 cm from the incisors.                           - Small hiatal hernia.                           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                            Biopsied.                           - Erythematous mucosa in the antrum. Biopsied.                           - No gross lesions in the duodenal bulb, in the                            first portion of the duodenum and in the second                            portion of the duodenum. Biopsied. Recommendation:           - Proceed to scheduled colonoscopy.                           - Observe patient's clinical course.                           - Await pathology results.                           - Continue present medications.                           - The findings and recommendations were discussed                            with the patient. Justice Britain, MD 10/25/2019 11:54:33 AM

## 2019-10-29 ENCOUNTER — Telehealth: Payer: Self-pay | Admitting: *Deleted

## 2019-10-29 ENCOUNTER — Telehealth: Payer: Self-pay

## 2019-10-29 NOTE — Telephone Encounter (Signed)
Left message on f/u call 

## 2019-10-29 NOTE — Telephone Encounter (Signed)
Called [phone] left a messaged we tried to reach pt for a follow up call. maw

## 2019-10-29 NOTE — Telephone Encounter (Signed)
Pt is returning call.  She said she has not had a BM since her procedure

## 2019-10-30 ENCOUNTER — Encounter: Payer: Self-pay | Admitting: Gastroenterology

## 2019-10-30 ENCOUNTER — Telehealth: Payer: Self-pay

## 2019-10-30 NOTE — Telephone Encounter (Signed)
-----   Message from Irving Copas., MD sent at 10/30/2019  9:03 AM EST ----- Regarding: Follow-up Patty or covering RN,Please schedule patient follow-up with myself in approximately 4 to 8 weeks or with one of the PAs as necessary and based on her schedule.Thanks.GM

## 2019-10-30 NOTE — Telephone Encounter (Signed)
Pt scheduled to see Dr. Jannifer Rodney 12/17/19@3 :10pm, appt letter mailed to pt.

## 2019-11-18 ENCOUNTER — Telehealth: Payer: Self-pay | Admitting: Gastroenterology

## 2019-11-18 NOTE — Telephone Encounter (Signed)
The pt has been having mucous come up into her throat.  She says that she has had this for some time.  She was advised to call PCP and discuss.  She has not been tested for COVID.  FYI Dr Rush Landmark

## 2019-11-18 NOTE — Telephone Encounter (Signed)
Agree with your thoughts and assessment. Patient likely needs to be evaluated by ENT, and a referral can be placed as well if she desires. Continue PPI at current dosing or she could trial twice daily PPI and see if that makes a difference. Thanks. GM

## 2019-11-19 NOTE — Telephone Encounter (Signed)
The pt has an appt with her PCP and will discuss.  She will call our office if she wants further help from our office.

## 2019-12-03 ENCOUNTER — Telehealth: Payer: Self-pay

## 2019-12-03 NOTE — Telephone Encounter (Signed)
Returned call to pt.  She has been trying to loose weight and has changed her eating habits, along with drinking more water and exercising.  She said that she now is only having 2-3 BM per week, which is less than she is used to.  She wanted to now if the change in bowel habit would be related to her colonoscopy in November.  I advised pt that I did not believe that they were related.  I told her since she has made several life style changes that could be apart of the equation.  But if she was concerned, she may want to make an appointment with Dr. Rush Landmark.  Pt said she will wait and see how she does. maw

## 2019-12-03 NOTE — Telephone Encounter (Signed)
I did not see message to me from pt on 10-29-2019.  Just saw that the pt had returned my call and had not had a BM since her procedure.  I left pt another message and asked her to call me if she had any other questions or concerns.  maw

## 2019-12-03 NOTE — Telephone Encounter (Signed)
-----   Message from Dow Adolph sent at 12/03/2019  8:09 AM EST ----- Pt said she is returning your call from this morning #[phone]

## 2019-12-17 ENCOUNTER — Ambulatory Visit: Payer: BC Managed Care – PPO | Admitting: Gastroenterology

## 2020-05-25 ENCOUNTER — Other Ambulatory Visit: Payer: Self-pay | Admitting: Family Medicine

## 2020-05-25 DIAGNOSIS — Z1231 Encounter for screening mammogram for malignant neoplasm of breast: Secondary | ICD-10-CM

## 2020-06-23 DIAGNOSIS — F411 Generalized anxiety disorder: Secondary | ICD-10-CM | POA: Diagnosis not present

## 2020-06-30 DIAGNOSIS — F411 Generalized anxiety disorder: Secondary | ICD-10-CM | POA: Diagnosis not present

## 2020-07-28 ENCOUNTER — Other Ambulatory Visit: Payer: Self-pay

## 2020-07-28 ENCOUNTER — Ambulatory Visit
Admission: RE | Admit: 2020-07-28 | Discharge: 2020-07-28 | Disposition: A | Discharge status: Home / Self Care | Payer: BC Managed Care – PPO | Source: Ambulatory Visit | Attending: Family Medicine | Admitting: Family Medicine

## 2020-07-28 DIAGNOSIS — N926 Irregular menstruation, unspecified: Secondary | ICD-10-CM | POA: Diagnosis not present

## 2020-07-28 DIAGNOSIS — E669 Obesity, unspecified: Secondary | ICD-10-CM | POA: Diagnosis not present

## 2020-07-28 DIAGNOSIS — E039 Hypothyroidism, unspecified: Secondary | ICD-10-CM | POA: Diagnosis not present

## 2020-07-28 DIAGNOSIS — Z1231 Encounter for screening mammogram for malignant neoplasm of breast: Secondary | ICD-10-CM | POA: Diagnosis not present

## 2020-07-28 DIAGNOSIS — E559 Vitamin D deficiency, unspecified: Secondary | ICD-10-CM | POA: Diagnosis not present

## 2020-08-03 ENCOUNTER — Encounter: Payer: Self-pay | Admitting: *Deleted

## 2020-08-04 ENCOUNTER — Institutional Professional Consult (permissible substitution): Payer: BC Managed Care – PPO | Admitting: Diagnostic Neuroimaging

## 2020-08-04 ENCOUNTER — Telehealth: Payer: Self-pay | Admitting: *Deleted

## 2020-08-04 NOTE — Telephone Encounter (Signed)
Patient was no show for new patient appointment today. 

## 2020-08-18 ENCOUNTER — Ambulatory Visit (INDEPENDENT_AMBULATORY_CARE_PROVIDER_SITE_OTHER): Payer: BC Managed Care – PPO | Admitting: Diagnostic Neuroimaging

## 2020-08-18 ENCOUNTER — Encounter: Payer: Self-pay | Admitting: Diagnostic Neuroimaging

## 2020-08-18 ENCOUNTER — Other Ambulatory Visit: Payer: Self-pay

## 2020-08-18 VITALS — BP 122/76 | HR 76 | Ht 64.0 in | Wt 243.8 lb

## 2020-08-18 DIAGNOSIS — F411 Generalized anxiety disorder: Secondary | ICD-10-CM | POA: Diagnosis not present

## 2020-08-18 DIAGNOSIS — R42 Dizziness and giddiness: Secondary | ICD-10-CM

## 2020-08-18 DIAGNOSIS — H539 Unspecified visual disturbance: Secondary | ICD-10-CM

## 2020-08-18 DIAGNOSIS — R55 Syncope and collapse: Secondary | ICD-10-CM | POA: Diagnosis not present

## 2020-08-18 NOTE — Progress Notes (Signed)
[facility]  PATIENT: Sharon Benson DOB: Nov 01, 1976  REFERRING CLINICIAN: Fidela Salisbury HISTORY FROM: patient  REASON FOR VISIT: new consult   HISTORICAL  CHIEF COMPLAINT:  Chief Complaint  Patient presents with  . Dizziness    rm 6, former patient-re establish care, "daily dizziness, scalp tenderness"   . Seizures    last seizure 04/2017    HISTORY OF PRESENT ILLNESS:   UPDATE (08/18/20, VRP): Since last visit, now having daily exertional lightheadedness, dizziness, transient visual loss (triggered by sneezing, turning head, exercise, sexual activity). Symptoms are present for past 6 months.  Severity is moderate. No alleviating or aggravating factors. Tolerating meds.  Patient also has had 85 pound intentional weight loss over the past 18 months by adjusting her diet and physical activity.  PRIOR HPI ([id number]): 44 year old right-handed female here for evaluation of seizure.  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 She had episodes where her eyes rolled back she would make a moaning, moderate noise and have some shaking. She's never on anticonvulsant medication. By puberty she was having 6 of these events per year. In her 3s patient had no events. In early 28s she was having 10-12 in one year.  Symptoms seem to be worse when she is fevers. They're under better control when she underwent stress. She has a warning sensation for some of these lasting for about 1 second which he feels a heat rising sensation in her stomach and sweaty sensation.  At age 34 years old she was having daily events during her pregnancy. She gave birth to a full-term child. That child had one seizure at age 46-year-old but now is doing fine.  Around 08/15/13, patient was [redacted] weeks pregnant, then had abdominal pain and bleeding, diagnosed with fetal demise. She was given intravaginal cytotec on Friday, then had 2 seizures on Sunday.    REVIEW OF SYSTEMS: Full 14  system review of systems performed and negative except: as per HPI.    ALLERGIES: Allergies  Allergen Reactions  . Other Anaphylaxis and Itching    Cantaloupe  Makes her tongue swell  . Cocoa Butter Hives  . Codeine Other (See Comments)    seizures    HOME MEDICATIONS:  Outpatient Medications Prior to Visit  Medication Sig Dispense Refill  . albuterol (PROVENTIL HFA;VENTOLIN HFA) 108 (90 Base) MCG/ACT inhaler Inhale 2 puffs into the lungs every 4 (four) hours as needed for wheezing or shortness of breath. 1 Inhaler 3  . levonorgestrel (MIRENA) 20 MCG/24HR IUD 1 each by Intrauterine route once.    Marland Kitchen SYNTHROID 100 MCG tablet Take 100 mcg by mouth daily.    Marland Kitchen thyroid (ARMOUR) 60 MG tablet Take 60 mg by mouth daily before breakfast.    . Thyroid 97.5 MG TABS Take 1 tablet by mouth 2 (two) times daily.    . Cholecalciferol (VITAMIN D-3) 5000 UNITS TABS Take 10,000 Units by mouth every evening.  (Patient not taking: Reported on 08/18/2020)    . esomeprazole (NEXIUM) 40 MG capsule Take 1 capsule (40 mg total) by mouth every evening. (Patient not taking: Reported on 08/18/2020) 30 capsule 2  . vitamin k 100 MCG tablet Take 100 mcg by mouth daily. (Patient not taking: Reported on 08/18/2020)    . ferrous gluconate (FERGON) 324 MG tablet Take 1 tablet (324 mg total) by mouth daily with breakfast. 30 tablet 6   No facility-administered medications prior to visit.    PAST MEDICAL HISTORY: Past Medical  History:  Diagnosis Date  . Anxiety    panic attack - not recent 2001ish  . Arthritis   . Asthma   . Cancer [facility])    thyroid cancer 2016  . Dizziness   . Endometriosis   . Fibroid   . GERD (gastroesophageal reflux disease)   . Headache(784.0)   . History of multiple miscarriages   . Hypothyroidism   . Insomnia   . Palpitations   . PCOS (polycystic ovarian syndrome)   . PONV (postoperative nausea and vomiting)    last 2- no vomiting-  Seizures- not with last 2.  . Seizures ([place])     last seizure 04/2017  . SVD (spontaneous vaginal delivery)    x 1  . Vertigo   . Vitamin D deficiency     PAST SURGICAL HISTORY: Past Surgical History:  Procedure Laterality Date  . BREAST REDUCTION SURGERY    . BREAST SURGERY  1997  . DILATION AND EVACUATION N/A 11/25/2013   Procedure: DILATATION AND EVACUATION;  Surgeon: Woodroe Mode, MD;  Location: [facility];  Service: Gynecology;  Laterality: N/A;  . DILATION AND EVACUATION N/A 11/30/2013   Procedure: DILATATION AND EVACUATION;  Surgeon: Jonnie Kind, MD;  Location: [facility];  Service: Gynecology;  Laterality: N/A;  . DILATION AND EVACUATION N/A 05/02/2014   Procedure: DILATATION AND EVACUATION;  Surgeon: Ena Dawley, MD;  Location: [facility];  Service: Gynecology;  Laterality: N/A;  . GANGLION CYST EXCISION  1999   right hand  . mirena    . REDUCTION MAMMAPLASTY Bilateral   . THYROIDECTOMY Bilateral 01/02/2015   Procedure: TOTAL THYROIDECTOMY;  Surgeon: Melissa Montane, MD;  [facility];  Service: ENT;  Laterality: Bilateral;    FAMILY HISTORY: Family History  Problem Relation Age of Onset  . Lung cancer Maternal Grandmother   . Colon cancer Maternal Grandmother   . Cancer Other   . Diabetes Other   . High blood pressure Other   . Diabetes Mother   . Hypertension Father   . Colon cancer Father        Dx at age 30 and it spread to his lungs, liver and bones  . Hypertension Paternal Grandmother   . Diabetes Paternal Grandmother   . Heart disease Paternal Grandmother   . Esophageal cancer Neg Hx   . Liver disease Neg Hx   . Pancreatic cancer Neg Hx   . Stomach cancer Neg Hx     SOCIAL HISTORY:  Social History   Socioeconomic History  . Marital status: Married    Spouse name: Josph Macho  . Number of children: 4  . Years of education: Not on file  . Highest education level: Not on file  Occupational History  . Occupation: [employer]  Tobacco Use  . Smoking status: Former Smoker    Years: 2.00    Types: Cigarettes     Quit date: 04/28/1997    Years since quitting: 23.3  . Smokeless tobacco: Never Used  . Tobacco comment: smoked in HS  Vaping Use  . Vaping Use: Never used  Substance and Sexual Activity  . Alcohol use: No  . Drug use: No    Types: Marijuana    Comment: 18 yrs ago  . Sexual activity: Yes    Partners: Male    Birth control/protection: I.U.D.  Other Topics Concern  . Not on file  Social History Narrative   Lives with husband, child 2 yrs old   caffeine coffee ,tea   Social  Determinants of Health   Financial Resource Strain:   . Difficulty of Paying Living Expenses: Not on file  Food Insecurity:   . Worried About Charity fundraiser in the Last Year: Not on file  . Ran Out of Food in the Last Year: Not on file  Transportation Needs:   . Lack of Transportation (Medical): Not on file  . Lack of Transportation (Non-Medical): Not on file  Physical Activity:   . Days of Exercise per Week: Not on file  . Minutes of Exercise per Session: Not on file  Stress:   . Feeling of Stress : Not on file  Social Connections:   . Frequency of Communication with Friends and Family: Not on file  . Frequency of Social Gatherings with Friends and Family: Not on file  . Attends Religious Services: Not on file  . Active Member of Clubs or Organizations: Not on file  . Attends Archivist Meetings: Not on file  . Marital Status: Not on file  Intimate Partner Violence:   . Fear of Current or Ex-Partner: Not on file  . Emotionally Abused: Not on file  . Physically Abused: Not on file  . Sexually Abused: Not on file     PHYSICAL EXAM  GENERAL EXAM/CONSTITUTIONAL: Vitals:  Vitals:   08/18/20 0838  BP: 122/76  Pulse: 76  Weight: 243 lb 12.8 oz (110.6 kg)  Height: 5\' 4"  (1.626 m)     Body mass index is 41.85 kg/m. Wt Readings from Last 8 Encounters:  08/18/20 243 lb 12.8 oz (110.6 kg)  10/25/19 275 lb (124.7 kg)  09/17/19 275 lb (124.7 kg)  05/11/18 260 lb (117.9 kg)    01/05/18 (!) 301 lb (136.5 kg)  01/05/18 (!) 301 lb (136.5 kg)  12/18/17 297 lb 12 oz (135.1 kg)  11/17/17 295 lb (133.8 kg)    Patient is in no distress; well developed, nourished and groomed; neck is supple  CARDIOVASCULAR:  Examination of carotid arteries is normal; no carotid bruits  Regular rate and rhythm, no murmurs  Examination of peripheral vascular system by observation and palpation is normal  EYES:  Ophthalmoscopic exam of optic discs and posterior segments is normal; no papilledema or hemorrhages  No exam data present  MUSCULOSKELETAL:  Gait, strength, tone, movements noted in Neurologic exam below  NEUROLOGIC: MENTAL STATUS:  No flowsheet data found.  awake, alert, oriented to person, place and time  recent and remote memory intact  normal attention and concentration  language fluent, comprehension intact, naming intact  fund of knowledge appropriate  CRANIAL NERVE:   2nd - no papilledema on fundoscopic exam  2nd, 3rd, 4th, 6th - pupils equal and reactive to light, visual fields full to confrontation, extraocular muscles intact, no nystagmus  5th - facial sensation symmetric  7th - facial strength symmetric  8th - hearing intact  9th - palate elevates symmetrically, uvula midline  11th - shoulder shrug symmetric  12th - tongue protrusion midline  MOTOR:   normal bulk and tone, full strength in the BUE, BLE  SENSORY:   normal and symmetric to light touch  COORDINATION:   finger-nose-finger, fine finger movements normal  REFLEXES:   deep tendon reflexes present and symmetric  GAIT/STATION:   narrow based gait     DIAGNOSTIC DATA (LABS, IMAGING, TESTING) - I reviewed patient records, labs, notes, testing and imaging myself where available.  Lab Results  Component Value Date   WBC 6.4 10/08/2019   HGB 13.4 10/08/2019  HCT 43.4 10/08/2019   MCV 90.8 10/08/2019   PLT 261 10/08/2019      Component Value Date/Time    NA 134 (L) 10/08/2019 2046   K 4.0 10/08/2019 2046   CL 97 (L) 10/08/2019 2046   CO2 24 10/08/2019 2046   GLUCOSE 77 10/08/2019 2046   BUN 10 10/08/2019 2046   CREATININE 0.86 10/08/2019 2046   CALCIUM 9.5 10/08/2019 2046   PROT 8.1 09/17/2019 1658   ALBUMIN 4.5 09/17/2019 1658   AST 15 09/17/2019 1658   ALT 17 09/17/2019 1658   ALKPHOS 68 09/17/2019 1658   BILITOT 0.4 09/17/2019 1658   GFRNONAA >60 10/08/2019 2046   GFRAA >60 10/08/2019 2046   No results found for: CHOL No results found for: HGBA1C Lab Results  Component Value Date   VITAMINB12 285 09/17/2019   Lab Results  Component Value Date   TSH 44.59 (H) 09/17/2019      ASSESSMENT AND PLAN  44 y.o. year old female here with:  Ddx: vertebrobasilar TIA, idiopathic intracranial hypertension (pseudotumor cerebri), migraine variant, presyncope / hypotension  1. Transient vision disturbance of both eyes   2. Postural dizziness with presyncope     PLAN:  TRANSIENT EXERTIONAL LIGHTHEADEDNESS, DIZZINESS, VISION LOSS - check MRI brain, MRA head / neck - may consider lumbar puncture for opening pressure - stay hydrated; monitor BP; consider follow up with PCP, cardiology for presyncope eval - caution with driving  Orders Placed This Encounter  Procedures  . MR BRAIN W WO CONTRAST  . MR ANGIO HEAD WO CONTRAST  . MR ANGIO NECK W WO CONTRAST   Return for pending if symptoms worsen or fail to improve.    Penni Bombard, MD 01/18/2535, 6:44 AM Certified in Neurology, Neurophysiology and Neuroimaging  [facility] [address], [place], [place] [address] [phone]

## 2020-08-19 ENCOUNTER — Telehealth: Payer: Self-pay | Admitting: Diagnostic Neuroimaging

## 2020-08-19 NOTE — Telephone Encounter (Signed)
Dillon Bjork: [phone] (exp. 08/19/20 to 10/17/20)[facility] pending faxed notes EE

## 2020-08-20 NOTE — Telephone Encounter (Signed)
[facility] approved faxed order to [facility] bc they are what is in network they will reach out to the patient to schedule (exp. 08/19/20 to 09/18/20)

## 2020-08-24 NOTE — Telephone Encounter (Signed)
[facility] Josem Kaufmann: [id number], [id number] & [phone] (exp. 08/19/20 to 09/18/20)

## 2020-09-01 DIAGNOSIS — H538 Other visual disturbances: Secondary | ICD-10-CM | POA: Diagnosis not present

## 2020-09-01 DIAGNOSIS — R55 Syncope and collapse: Secondary | ICD-10-CM | POA: Diagnosis not present

## 2020-09-01 DIAGNOSIS — H539 Unspecified visual disturbance: Secondary | ICD-10-CM | POA: Diagnosis not present

## 2020-09-01 DIAGNOSIS — R42 Dizziness and giddiness: Secondary | ICD-10-CM | POA: Diagnosis not present

## 2020-09-02 ENCOUNTER — Encounter: Payer: Self-pay | Admitting: *Deleted

## 2020-09-02 ENCOUNTER — Telehealth: Payer: Self-pay | Admitting: *Deleted

## 2020-09-02 NOTE — Telephone Encounter (Signed)
Sent my chart with MD's result note.

## 2020-09-02 NOTE — Telephone Encounter (Signed)
Monitor sxs. Scans ok. -VRP

## 2020-09-02 NOTE — Telephone Encounter (Signed)
Received results re: MRI brain, MRA head form [facility]. Placed on MD desk for review, next steps.

## 2020-09-03 NOTE — Telephone Encounter (Signed)
Called [facility] medical records, spoke with Olivia Mackie and asked for MRA neck results. She stated that she will try to fax again, but stated impression: unremarkable scan. Thanked her for her time.

## 2020-09-03 NOTE — Telephone Encounter (Signed)
Spoke with Olivia Mackie, [facility] who confirmed MRA angio neck report impression is unremarkable. No evidence of aneurysms. She will send again to [facility].

## 2020-09-03 NOTE — Telephone Encounter (Signed)
Is she sure that is is for NECK? I cannot see report in care everywhere for the neck; I can see brain and head scans. -VRP

## 2020-09-07 ENCOUNTER — Encounter: Payer: Self-pay | Admitting: *Deleted

## 2020-09-07 NOTE — Telephone Encounter (Signed)
Called patient and informed her per Dr Leta Baptist her MRA neck result is okay. She stated that she will call and pay for FMLA papers; she also requested a letter stating that she must work from home indefinitely due to having daily vertigo/dizziness that can be brought on with coughing, sneezing, sexual activity and other activities. She does drive but only if her husband is with her and can take over if she pulls to side of the road. He is a Administrator and is only home Tues or weekend. I advised she may need vestibular PT; she stated that it would be difficult to come to appointments due to her dizziness. I advised will see if MD has any recommendations on her ongoing dizziness, discuss letter and let her know if any further advice. Patient verbalized understanding, appreciation.

## 2020-09-07 NOTE — Telephone Encounter (Signed)
Left detailed VM with Dr Gladstone Lighter reply, advised will get letter prepared and mailed tomorrow. Left # for questions. Letter on MD's desk for review, signature.

## 2020-09-07 NOTE — Telephone Encounter (Signed)
Received disk of MRA head/neck, MRI brain. Placed on MD's desk for review, results on MRA neck.

## 2020-09-07 NOTE — Telephone Encounter (Signed)
No specific neurologic diagnosis has been found. Recommend to follow up with PCP, cardiology for further mgmt and work accommodations. We can extend for 1 more month to give her time to touch base with PCP. -VRP

## 2020-09-07 NOTE — Telephone Encounter (Signed)
Letter signed, placed in medical records box for mailing tomorrow.

## 2020-11-19 DIAGNOSIS — H8111 Benign paroxysmal vertigo, right ear: Secondary | ICD-10-CM | POA: Diagnosis not present

## 2020-11-19 DIAGNOSIS — R42 Dizziness and giddiness: Secondary | ICD-10-CM | POA: Diagnosis not present

## 2020-11-19 DIAGNOSIS — H9203 Otalgia, bilateral: Secondary | ICD-10-CM | POA: Diagnosis not present

## 2020-12-17 DIAGNOSIS — F4310 Post-traumatic stress disorder, unspecified: Secondary | ICD-10-CM | POA: Diagnosis not present

## 2020-12-23 DIAGNOSIS — F4310 Post-traumatic stress disorder, unspecified: Secondary | ICD-10-CM | POA: Diagnosis not present

## 2020-12-31 DIAGNOSIS — F4310 Post-traumatic stress disorder, unspecified: Secondary | ICD-10-CM | POA: Diagnosis not present

## 2021-01-07 DIAGNOSIS — F4310 Post-traumatic stress disorder, unspecified: Secondary | ICD-10-CM | POA: Diagnosis not present

## 2021-01-12 DIAGNOSIS — F4310 Post-traumatic stress disorder, unspecified: Secondary | ICD-10-CM | POA: Diagnosis not present

## 2021-01-21 DIAGNOSIS — F4310 Post-traumatic stress disorder, unspecified: Secondary | ICD-10-CM | POA: Diagnosis not present

## 2021-01-26 DIAGNOSIS — E559 Vitamin D deficiency, unspecified: Secondary | ICD-10-CM | POA: Diagnosis not present

## 2021-01-26 DIAGNOSIS — Z789 Other specified health status: Secondary | ICD-10-CM | POA: Diagnosis not present

## 2021-01-26 DIAGNOSIS — E669 Obesity, unspecified: Secondary | ICD-10-CM | POA: Diagnosis not present

## 2021-01-26 DIAGNOSIS — E039 Hypothyroidism, unspecified: Secondary | ICD-10-CM | POA: Diagnosis not present

## 2021-01-26 DIAGNOSIS — N926 Irregular menstruation, unspecified: Secondary | ICD-10-CM | POA: Diagnosis not present

## 2021-01-28 DIAGNOSIS — F4310 Post-traumatic stress disorder, unspecified: Secondary | ICD-10-CM | POA: Diagnosis not present

## 2021-02-01 DIAGNOSIS — S83242A Other tear of medial meniscus, current injury, left knee, initial encounter: Secondary | ICD-10-CM | POA: Diagnosis not present

## 2021-02-03 DIAGNOSIS — F4310 Post-traumatic stress disorder, unspecified: Secondary | ICD-10-CM | POA: Diagnosis not present

## 2021-02-06 DIAGNOSIS — M25562 Pain in left knee: Secondary | ICD-10-CM | POA: Diagnosis not present

## 2021-03-26 ENCOUNTER — Emergency Department (HOSPITAL_COMMUNITY): Payer: BC Managed Care – PPO

## 2021-03-26 ENCOUNTER — Emergency Department (HOSPITAL_COMMUNITY)
Admission: EM | Admit: 2021-03-26 | Discharge: 2021-03-26 | Disposition: A | Discharge status: Home / Self Care | Payer: BC Managed Care – PPO | Attending: Emergency Medicine | Admitting: Emergency Medicine

## 2021-03-26 ENCOUNTER — Encounter (HOSPITAL_COMMUNITY): Payer: Self-pay

## 2021-03-26 DIAGNOSIS — X501XXA Overexertion from prolonged static or awkward postures, initial encounter: Secondary | ICD-10-CM | POA: Diagnosis not present

## 2021-03-26 DIAGNOSIS — Z85850 Personal history of malignant neoplasm of thyroid: Secondary | ICD-10-CM | POA: Insufficient documentation

## 2021-03-26 DIAGNOSIS — Z87891 Personal history of nicotine dependence: Secondary | ICD-10-CM | POA: Diagnosis not present

## 2021-03-26 DIAGNOSIS — S8991XA Unspecified injury of right lower leg, initial encounter: Secondary | ICD-10-CM | POA: Diagnosis not present

## 2021-03-26 DIAGNOSIS — E039 Hypothyroidism, unspecified: Secondary | ICD-10-CM | POA: Insufficient documentation

## 2021-03-26 DIAGNOSIS — Z79899 Other long term (current) drug therapy: Secondary | ICD-10-CM | POA: Diagnosis not present

## 2021-03-26 DIAGNOSIS — R0789 Other chest pain: Secondary | ICD-10-CM | POA: Insufficient documentation

## 2021-03-26 DIAGNOSIS — R0602 Shortness of breath: Secondary | ICD-10-CM | POA: Diagnosis not present

## 2021-03-26 DIAGNOSIS — S8391XA Sprain of unspecified site of right knee, initial encounter: Secondary | ICD-10-CM | POA: Diagnosis not present

## 2021-03-26 DIAGNOSIS — J302 Other seasonal allergic rhinitis: Secondary | ICD-10-CM | POA: Diagnosis not present

## 2021-03-26 LAB — BASIC METABOLIC PANEL
Anion gap: 8 (ref 5–15)
BUN: 20 mg/dL (ref 6–20)
CO2: 28 mmol/L (ref 22–32)
Calcium: 9.4 mg/dL (ref 8.9–10.3)
Chloride: 107 mmol/L (ref 98–111)
Creatinine, Ser: 0.95 mg/dL (ref 0.44–1.00)
GFR, Estimated: >60 mL/min (ref >60)
Glucose, Bld: 82 mg/dL (ref 70–99)
Potassium: 4.7 mmol/L (ref 3.5–5.1)
Sodium: 143 mmol/L (ref 135–145)

## 2021-03-26 LAB — CBC
HCT: 43.5 % (ref 36.0–46.0)
Hemoglobin: 13.7 g/dL (ref 12.0–15.0)
MCH: 29.9 pg (ref 26.0–34.0)
MCHC: 31.5 g/dL (ref 30.0–36.0)
MCV: 95 fL (ref 80.0–100.0)
Platelets: 214 10*3/uL (ref 150–400)
RBC: 4.58 MIL/uL (ref 3.87–5.11)
RDW: 13.3 % (ref 11.5–15.5)
WBC: 5.9 10*3/uL (ref 4.0–10.5)
nRBC: 0 % (ref 0.0–0.2)

## 2021-03-26 LAB — BRAIN NATRIURETIC PEPTIDE: B Natriuretic Peptide: 47.2 pg/mL (ref 0.0–100.0)

## 2021-03-26 LAB — D-DIMER, QUANTITATIVE: D-Dimer, Quant: 1.15 ug/mL-FEU — ABNORMAL HIGH (ref 0.00–0.50)

## 2021-03-26 LAB — I-STAT BETA HCG BLOOD, ED (MC, WL, AP ONLY): I-stat hCG, quantitative: <5 m[IU]/mL (ref <5)

## 2021-03-26 LAB — TROPONIN I (HIGH SENSITIVITY): Troponin I (High Sensitivity): <2 ng/L (ref <18)

## 2021-03-26 MED ORDER — IOHEXOL 350 MG/ML SOLN
100.0000 mL | Freq: Once | INTRAVENOUS | Status: AC | PRN
  Administered 2021-03-26: 100 mL via INTRAVENOUS

## 2021-03-26 MED ORDER — FENTANYL CITRATE (PF) 100 MCG/2ML IJ SOLN: 25.0000 ug | Freq: Once | INTRAMUSCULAR | Status: DC

## 2021-03-26 MED ORDER — ZYRTEC ALLERGY 10 MG PO CAPS: 10.0000 mg | ORAL_CAPSULE | Freq: Every day | ORAL | 0 refills | Status: DC

## 2021-03-26 NOTE — Discharge Instructions (Signed)
Take Zyrtec to help with your symptoms. Wear the knee brace. Continue using your albuterol inhaler as needed. Follow-up with your primary care provider or the 1 listed below. Return to the ER if you start to experience worsening leg swelling, chest pain, shortness of breath or wheezing.

## 2021-03-26 NOTE — ED Provider Notes (Signed)
[place] DEPT Provider Note   CSN: [id number] Arrival date & time: 03/26/21  1402     History Chief Complaint  Patient presents with  . Shortness of Breath  . Knee Pain    Sharon Benson is a 45 y.o. female with a past medical history of asthma, GERD presenting to the ED with chief complaint of shortness of breath and chest pressure.  States that for the past 3 months she has noticed chest pressure and shortness of breath that has been intermittent without specific trigger.  Noticed for the past weeks that this has worsened.  Most recently woke her up in the middle of the night and described as "an attack."  States that she rushed over to her albuterol inhaler which caused her to twist her right knee.  She has been having pain in her right knee since then.  She use the inhaler and is unsure "if it actually helped or if it just calmed me down."  She thought for the past few months that this may have been due to anxiety or stress however due to symptoms worsening wanted to get evaluated.  Denies any wheezing or cough.  She noticed that she does have intermittent leg swelling.  She noticed that the left leg was swollen slightly more and went to her primary care provider who did an ultrasound and told her that it could be due to arthritis.  She denies any fever, hemoptysis, history of DVT or PE, MI, recent immobilization, OCP use, abdominal pain, chest pain.  HPI     Past Medical History:  Diagnosis Date  . Anxiety    panic attack - not recent 2001ish  . Arthritis   . Asthma   . Cancer [facility])    thyroid cancer 2016  . Dizziness   . Endometriosis   . Fibroid   . GERD (gastroesophageal reflux disease)   . Headache(784.0)   . History of multiple miscarriages   . Hypothyroidism   . Insomnia   . Palpitations   . PCOS (polycystic ovarian syndrome)   . PONV (postoperative nausea and vomiting)    last 2- no vomiting-  Seizures- not with last 2.  .  Seizures ([place])    last seizure 04/2017  . SVD (spontaneous vaginal delivery)    x 1  . Vertigo   . Vitamin D deficiency     Patient Active Problem List   Diagnosis Date Noted  . FHx: colon cancer 09/19/2019  . Gastroesophageal reflux disease 09/19/2019  . Dysphagia 09/19/2019  . Change in bowel habit 09/19/2019  . Anemia 09/19/2019  . Throat clearing 09/19/2019  . Hypothyroidism 01/07/2018  . Rupture of membranes with delay of delivery 01/05/2018  . Obesity affecting pregnancy, antepartum, third trimester 01/05/2018  . SVD (spontaneous vaginal delivery) 01/05/2018  . Preterm labor in third trimester 11/17/2017  . Asthma affecting pregnancy, antepartum 08/03/2017  . SOB (shortness of breath) 08/03/2017  . Feeling of chest tightness 08/03/2017  . Fibroids 06/03/2017  . AMA (advanced maternal age) multigravida 35+ 06/03/2017  . 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 Morbid obesity with BMI of 45.0-49.9, adult ([place]) 06/03/2017  . Palpitations 11/09/2015  . Thyroid mass 01/02/2015  . GOITER, UNSPECIFIED 07/20/2010  . MIGRAINE HEADACHE 07/20/2010  . SEIZURE DISORDER, HX OF 07/20/2010    Past Surgical History:  Procedure Laterality Date  . BREAST REDUCTION SURGERY    . BREAST SURGERY  1997  . DILATION AND EVACUATION N/A  11/25/2013   Procedure: DILATATION AND EVACUATION;  Surgeon: Woodroe Mode, MD;  Location: [facility];  Service: Gynecology;  Laterality: N/A;  . DILATION AND EVACUATION N/A 11/30/2013   Procedure: DILATATION AND EVACUATION;  Surgeon: Jonnie Kind, MD;  Location: [facility];  Service: Gynecology;  Laterality: N/A;  . DILATION AND EVACUATION N/A 05/02/2014   Procedure: DILATATION AND EVACUATION;  Surgeon: Ena Dawley, MD;  Location: [facility];  Service: Gynecology;  Laterality: N/A;  . GANGLION CYST EXCISION  1999   right hand  . mirena    . REDUCTION MAMMAPLASTY Bilateral   . THYROIDECTOMY Bilateral 01/02/2015   Procedure: TOTAL THYROIDECTOMY;   Surgeon: Melissa Montane, MD;  Location: [facility];  Service: ENT;  Laterality: Bilateral;     OB History    Gravida  5   Para  2   Term  2   Preterm      AB  3   Living  2     SAB  3   IAB      Ectopic      Multiple  0   Live Births  2           Family History  Problem Relation Age of Onset  . Lung cancer Maternal Grandmother   . Colon cancer Maternal Grandmother   . Cancer Other   . Diabetes Other   . High blood pressure Other   . Diabetes Mother   . Hypertension Father   . Colon cancer Father        Dx at age 4 and it spread to his lungs, liver and bones  . Hypertension Paternal Grandmother   . Diabetes Paternal Grandmother   . Heart disease Paternal Grandmother   . Esophageal cancer Neg Hx   . Liver disease Neg Hx   . Pancreatic cancer Neg Hx   . Stomach cancer Neg Hx     Social History   Tobacco Use  . Smoking status: Former Smoker    Years: 2.00    Types: Cigarettes    Quit date: 04/28/1997    Years since quitting: 23.9  . Smokeless tobacco: Never Used  . Tobacco comment: smoked in HS  Vaping Use  . Vaping Use: Never used  Substance Use Topics  . Alcohol use: No  . Drug use: No    Types: Marijuana    Comment: 18 yrs ago    Home Medications Prior to Admission medications   Medication Sig Start Date End Date Taking? Authorizing Provider  albuterol (PROVENTIL HFA;VENTOLIN HFA) 108 (90 Base) MCG/ACT inhaler Inhale 2 puffs into the lungs every 4 (four) hours as needed for wheezing or shortness of breath. 12/10/16  Yes Muthersbaugh, Jarrett Soho, PA-C  Cetirizine HCl (ZYRTEC ALLERGY) 10 MG CAPS Take 1 capsule (10 mg total) by mouth daily. 03/26/21  Yes Iisha Soyars, PA-C  Cholecalciferol (VITAMIN D-3) 5000 UNITS TABS Take 10,000 Units by mouth every evening.   Yes [provider]  levonorgestrel (MIRENA) 20 MCG/24HR IUD 1 each by Intrauterine route continuous.   Yes [provider]  levothyroxine (SYNTHROID) 200 MCG tablet Take 200 mcg  by mouth daily before breakfast.   Yes [provider]  meloxicam (MOBIC) 15 MG tablet Take 15 mg by mouth daily as needed for pain.   Yes [provider]  thyroid (ARMOUR) 60 MG tablet Take 60 mg by mouth daily before breakfast.   Yes [provider]  esomeprazole (NEXIUM) 40 MG capsule Take 1 capsule (  40 mg total) by mouth every evening. Patient not taking: No sig reported 09/17/19   Mansouraty, Telford Nab., MD  SAXENDA 18 MG/3ML SOPN Inject 18 mg into the skin daily. 02/09/21   [provider]    Allergies    Other, Cocoa butter, and Codeine  Review of Systems   Review of Systems  Constitutional: Negative for appetite change, chills and fever.  HENT: Negative for ear pain, rhinorrhea, sneezing and sore throat.   Eyes: Negative for photophobia and visual disturbance.  Respiratory: Positive for chest tightness and shortness of breath. Negative for cough and wheezing.   Cardiovascular: Negative for chest pain and palpitations.  Gastrointestinal: Negative for abdominal pain, blood in stool, constipation, diarrhea, nausea and vomiting.  Genitourinary: Negative for dysuria, hematuria and urgency.  Musculoskeletal: Positive for arthralgias. Negative for myalgias.  Skin: Negative for rash.  Neurological: Negative for dizziness, weakness and light-headedness.    Physical Exam Updated Vital Signs BP (!) 146/93   Pulse 72   Temp 98.3 F (36.8 C) (Oral)   Resp 18   Ht 5\' 4"  (1.626 m)   Wt 123.8 kg   LMP 03/25/2021   SpO2 100%   BMI 46.86 kg/m   Physical Exam Vitals and nursing note reviewed.  Constitutional:      General: She is not in acute distress.    Appearance: She is well-developed. She is obese.     Comments: Speaking complete sentences.  HENT:     Head: Normocephalic and atraumatic.     Nose: Nose normal.  Eyes:     General: No scleral icterus.       Left eye: No discharge.     Conjunctiva/sclera: Conjunctivae normal.   Cardiovascular:     Rate and Rhythm: Normal rate and regular rhythm.     Heart sounds: Normal heart sounds. No murmur heard. No friction rub. No gallop.   Pulmonary:     Effort: Pulmonary effort is normal. No respiratory distress.     Breath sounds: Normal breath sounds.  Abdominal:     General: Bowel sounds are normal. There is no distension.     Palpations: Abdomen is soft.     Tenderness: There is no abdominal tenderness. There is no guarding.  Musculoskeletal:        General: Normal range of motion.     Cervical back: Normal range of motion and neck supple.     Right lower leg: Tenderness (Right knee, no overlying skin changes, no changes to range of motion) present. No edema.     Left lower leg: No tenderness. No edema.     Comments: 2+ DP pulse noted bilaterally.  Skin:    General: Skin is warm and dry.     Findings: No rash.  Neurological:     Mental Status: She is alert.     Motor: No abnormal muscle tone.     Coordination: Coordination normal.     ED Results / Procedures / Treatments   Labs (all labs ordered are listed, but only abnormal results are displayed) Labs Reviewed  D-DIMER, QUANTITATIVE - Abnormal; Notable for the following components:      Result Value   D-Dimer, Quant 1.15 (*)    All other components within normal limits  CBC  BASIC METABOLIC PANEL  BRAIN NATRIURETIC PEPTIDE  I-STAT BETA HCG BLOOD, ED ([facility], [facility], [facility] ONLY)  TROPONIN I (HIGH SENSITIVITY)    EKG EKG Interpretation  Date/Time:  Friday March 26 2021 15:32:47 EDT Ventricular  Rate:  72 PR Interval:  167 QRS Duration: 98 QT Interval:  364 QTC Calculation: 399 R Axis:   60 Text Interpretation: Sinus rhythm Low voltage, precordial leads Abnormal R-wave progression, early transition No significant change since prior 8/18 Confirmed by Aletta Edouard [id number]) on 03/26/2021 3:48:26 PM   Radiology DG Chest 2 View  Result Date: 03/26/2021 CLINICAL DATA:  Shortness of breath and chest  tightness a few months becoming worse since last weekend. EXAM: CHEST - 2 VIEW COMPARISON:  08/03/2017 FINDINGS: Lungs are adequately inflated without airspace consolidation or effusion. Cardiomediastinal silhouette and remainder of the exam is unchanged. IMPRESSION: No active cardiopulmonary disease. Electronically Signed   By: Marin Olp M.D.   On: 03/26/2021 14:59   CT Angio Chest PE W/Cm &/Or Wo Cm  Result Date: 03/26/2021 CLINICAL DATA:  Short of breath for 1 week, positive D dimer EXAM: CT ANGIOGRAPHY CHEST WITH CONTRAST TECHNIQUE: Multidetector CT imaging of the chest was performed using the standard protocol during bolus administration of intravenous contrast. Multiplanar CT image reconstructions and MIPs were obtained to evaluate the vascular anatomy. CONTRAST:  13mL OMNIPAQUE IOHEXOL 350 MG/ML SOLN COMPARISON:  03/26/2021, 12/09/2016 FINDINGS: Cardiovascular: This is a technically adequate evaluation of the pulmonary vasculature. No filling defects or pulmonary emboli. The heart is unremarkable without pericardial effusion. No evidence of thoracic aortic aneurysm or dissection. Mediastinum/Nodes: No enlarged mediastinal, hilar, or axillary lymph nodes. Thyroid is surgically absent. The trachea and esophagus demonstrate no significant findings. Lungs/Pleura: No airspace disease, effusion, or pneumothorax. Central airways are widely patent. Upper Abdomen: No acute abnormality. Musculoskeletal: No acute or destructive bony lesions. Reconstructed images demonstrate no additional findings. Review of the MIP images confirms the above findings. IMPRESSION: 1. No evidence of pulmonary embolus. 2. No acute intrathoracic process. Electronically Signed   By: Randa Ngo M.D.   On: 03/26/2021 18:09   DG Knee Complete 4 Views Right  Result Date: 03/26/2021 CLINICAL DATA:  Twisting injury, right anterior knee pain EXAM: RIGHT KNEE - COMPLETE 4+ VIEW COMPARISON:  None. FINDINGS: Frontal, bilateral oblique,  lateral views of the right knee are obtained. No fracture, subluxation, or dislocation. Joint spaces are well preserved. No joint effusion. IMPRESSION: 1. Unremarkable right knee. Electronically Signed   By: Randa Ngo M.D.   On: 03/26/2021 16:14    Procedures Procedures   Medications Ordered in ED Medications  iohexol (OMNIPAQUE) 350 MG/ML injection 100 mL (100 mLs Intravenous Contrast Given 03/26/21 1733)    ED Course  I have reviewed the triage vital signs and the nursing notes.  Pertinent labs & imaging results that were available during my care of the patient were reviewed by me and considered in my medical decision making (see chart for details).  Clinical Course as of 03/26/21 1829  Fri Mar 26, 2021  1628 I-stat hCG, quantitative: <5.0 [HK]  1628 D-Dimer, Quant(!): 1.15 [HK]  1701 Troponin I (High Sensitivity): <2 [HK]  1701 B Natriuretic Peptide: 47.2 [HK]    Clinical Course User Index [HK] Delia Heady, PA-C   MDM Rules/Calculators/A&P                          45 year old female with past medical history of asthma, GERD presenting to the ED with a chief complaint of shortness of breath and chest pressure.  For the past 3 months has noted chest pressure and shortness of breath that is been intermittent without specific trigger.  She has noticed during  this time of the year when she comes back into the house from being outside her symptoms worsen.  She is also complaining of a possible right knee sprain while running to grab her inhaler.  She denies any fever, cough.  She has noticed intermittent bilateral lower extremity edema but was negative for DVT a few months ago.  On exam there is tenderness palpation of the right knee without changes to range of motion.  No overlying skin changes.  Equal and intact distal pulses noted bilaterally.  Lungs are clear to auscultation bilaterally.  No signs of respiratory distress.  No pitting edema noted bilaterally.  Work appears  significant for negative troponin x1.  Negative BNP, BMP, CBC.  EKG shows sinus rhythm, no ischemic changes, no changes from prior tracings.  Chest x-ray without any acute findings.  X-ray of the right knee without any abnormalities.  D-dimer slightly elevated to 1.1.  CT angio of the chest was obtained which was negative for PE or other abnormality.  Question whether this could be due to seasonal allergies.  I suspect that her knee pain could be due to a sprain rather than infectious or vascular cause.  Will treat with knee brace and NSAIDs.  She is interested in starting antihistamines a week start her on Zyrtec.  We will have her follow-up with her primary care provider.  Return precautions given   Patient is hemodynamically stable, in NAD, and able to ambulate in the ED. Evaluation does not show pathology that would require ongoing emergent intervention or inpatient treatment. I explained the diagnosis to the patient. Pain has been managed and has no complaints prior to discharge. Patient is comfortable with above plan and is stable for discharge at this time. All questions were answered prior to disposition. Strict return precautions for returning to the ED were discussed. Encouraged follow up with PCP.   An After Visit Summary was printed and given to the patient.   Portions of this note were generated with [employer]. Dictation errors may occur despite best attempts at proofreading.  Final Clinical Impression(s) / ED Diagnoses Final diagnoses:  Seasonal allergies  Sprain of right knee, unspecified ligament, initial encounter    Rx / DC Orders ED Discharge Orders         Ordered    Cetirizine HCl (ZYRTEC ALLERGY) 10 MG CAPS  Daily        03/26/21 1826           Delia Heady, PA-C 03/26/21 1829    Hayden Rasmussen, MD 03/27/21 [phone]

## 2021-03-26 NOTE — Progress Notes (Signed)
Orthopedic Tech Progress Note Patient Details:  Sharon Benson [birth year] [id number]  Ortho Devices Type of Ortho Device: Knee Sleeve Ortho Device/Splint Location: Right Knee Ortho Device/Splint Interventions: Application   Post Interventions Patient Tolerated: Well Instructions Provided: Adjustment of device   [name] E [name] 03/26/2021, 6:45 PM

## 2021-03-26 NOTE — ED Triage Notes (Signed)
Emergency Medicine Provider Triage Evaluation Note  Sharon Benson , a 45 y.o. female  was evaluated in triage.  Pt complains of shortness of breath x 3 months.  Review of Systems  Positive: SOB at rest, chest tightness Negative: Fevers, chills, hemoptysis, leg swelling   Physical Exam  BP (!) 142/107 (BP Location: Right Arm)   Pulse 73   Temp 98.3 F (36.8 C) (Oral)   Resp 20   Ht 5\' 4"  (1.626 m)   Wt 123.8 kg   SpO2 100%   BMI 46.86 kg/m  Gen:   Awake, no distress   HEENT:  Atraumatic  Resp:  Normal effort  Cardiac:  Normal rate  Abd:   Nondistended, nontender  MSK:   Moves extremities without difficulty  Neuro:  Speech clear   Medical Decision Making  Medically screening exam initiated at 2:29 PM.  Appropriate orders placed.  Sharon Benson was informed that the remainder of the evaluation will be completed by another provider, this initial triage assessment does not replace that evaluation, and the importance of remaining in the ED until their evaluation is complete.  Clinical Impression     Sharon Balding, PA-C 03/26/21 1430

## 2021-03-26 NOTE — ED Triage Notes (Signed)
Patient states that she has been having SOB and chest tightness for a couple of months, but worse since last weekend. Patient states she tried to blow a balloon up and was unable to do so. Patient states she has had increased SOB today.  Patient states she was trying to get to her Albuterol inhaler today and twisted her right knee.

## 2021-03-26 NOTE — ED Notes (Signed)
Pt in imaging

## 2021-04-28 ENCOUNTER — Ambulatory Visit: Payer: BC Managed Care – PPO | Admitting: Adult Health

## 2021-04-29 ENCOUNTER — Ambulatory Visit: Payer: BC Managed Care – PPO | Admitting: Adult Health

## 2021-05-17 ENCOUNTER — Ambulatory Visit (INDEPENDENT_AMBULATORY_CARE_PROVIDER_SITE_OTHER): Payer: BC Managed Care – PPO | Admitting: Dermatology

## 2021-05-17 ENCOUNTER — Other Ambulatory Visit: Payer: Self-pay

## 2021-05-17 DIAGNOSIS — L818 Other specified disorders of pigmentation: Secondary | ICD-10-CM | POA: Diagnosis not present

## 2021-05-17 DIAGNOSIS — Z1283 Encounter for screening for malignant neoplasm of skin: Secondary | ICD-10-CM

## 2021-05-17 DIAGNOSIS — D229 Melanocytic nevi, unspecified: Secondary | ICD-10-CM

## 2021-05-17 DIAGNOSIS — D225 Melanocytic nevi of trunk: Secondary | ICD-10-CM

## 2021-05-17 DIAGNOSIS — L814 Other melanin hyperpigmentation: Secondary | ICD-10-CM

## 2021-05-17 DIAGNOSIS — L821 Other seborrheic keratosis: Secondary | ICD-10-CM

## 2021-05-17 DIAGNOSIS — D2262 Melanocytic nevi of left upper limb, including shoulder: Secondary | ICD-10-CM

## 2021-05-17 DIAGNOSIS — L918 Other hypertrophic disorders of the skin: Secondary | ICD-10-CM

## 2021-05-17 NOTE — Progress Notes (Signed)
   New Patient Visit  Subjective  Sharon Benson is a 45 y.o. female who presents for the following: New Patient (Initial Visit) (Patient here is as a new patient. She would like to have full body exam today. She has several moles she would like checked. Patient had a mole on left side of face near ear in 1999. She states area was benign. Patient states there are 2 on right breast, 1 on right arm, and 1 on left arm she has noticed changed. ).  Patient here for full body skin exam and skin cancer screening.   Objective  Well appearing patient in no apparent distress; mood and affect are within normal limits.  A full examination was performed including scalp, head, eyes, ears, nose, lips, neck, chest, axillae, abdomen, back, buttocks, bilateral upper extremities, bilateral lower extremities, hands, feet, fingers, toes, fingernails, and toenails. All findings within normal limits unless otherwise noted below.  Objective  left posterior arm: 2 mm medium dark brown macule   left upper inner arm: 5 mm speckled brown macule   right lateral breast: 4 mm brown papule   Objective  arms, legs, lower back: Scattered small hypopigmented macules  Assessment & Plan  Nevus (3) left upper inner arm; left posterior arm; right lateral breast  Benign-appearing.  Observation.  Call clinic for new or changing lesions.  Recommend daily use of broad spectrum spf 30+ sunscreen to sun-exposed areas.    Idiopathic guttate hypomelanosis arms, legs, lower back  Discussed chronic benign inherited trait, no treatment, but generally not progressive. Observation      Acrochordons (Skin Tags) - Fleshy, skin-colored pedunculated papules on right antecubitum, right neck, and groin  - Benign appearing.  - Observe. - If desired, they can be removed with an in office procedure that is not covered by insurance. - Please call the clinic if you notice any new or changing lesions.  Lentigines/benign  melanotic macules - Few scattered tan macules on bilateral palms and soles of feet - normal variant - Benign-appering, observe - Recommend daily broad spectrum sunscreen SPF 30+ to sun-exposed areas, reapply every 2 hours as needed. - Call for any changes  Seborrheic Keratoses - Stuck-on, waxy, tan-brown papules and/or plaques left upper arm, right chest, abdomen  - Benign-appearing - Discussed benign etiology and prognosis. - Observe - Call for any changes   Skin cancer screening performed today.  Return if symptoms worsen or fail to improve.  I, Ruthell Rummage, CMA, am acting as scribe for Brendolyn Patty, MD.  Documentation: I have reviewed the above documentation for accuracy and completeness, and I agree with the above.  Brendolyn Patty MD

## 2021-05-17 NOTE — Patient Instructions (Addendum)
Seborrheic Keratosis  What causes seborrheic keratoses? Seborrheic keratoses are harmless, common skin growths that first appear during adult life.  As time goes by, more growths appear.  Some people may develop a large number of them.  Seborrheic keratoses appear on both covered and uncovered body parts.  They are not caused by sunlight.  The tendency to develop seborrheic keratoses can be inherited.  They vary in color from skin-colored to gray, brown, or even black.  They can be either smooth or have a rough, warty surface.   Seborrheic keratoses are superficial and look as if they were stuck on the skin.  Under the microscope this type of keratosis looks like layers upon layers of skin.  That is why at times the top layer may seem to fall off, but the rest of the growth remains and re-grows.    Treatment Seborrheic keratoses do not need to be treated, but can easily be removed in the office.  Seborrheic keratoses often cause symptoms when they rub on clothing or jewelry.  Lesions can be in the way of shaving.  If they become inflamed, they can cause itching, soreness, or burning.  Removal of a seborrheic keratosis can be accomplished by freezing, burning, or surgery. If any spot bleeds, scabs, or grows rapidly, please return to have it checked, as these can be an indication of a skin cancer.  Melanoma ABCDEs  Melanoma is the most dangerous type of skin cancer, and is the leading cause of death from skin disease.  You are more likely to develop melanoma if you: Have light-colored skin, light-colored eyes, or red or blond hair Spend a lot of time in the sun Tan regularly, either outdoors or in a tanning bed Have had blistering sunburns, especially during childhood Have a close family member who has had a melanoma Have atypical moles or large birthmarks  Early detection of melanoma is key since treatment is typically straightforward and cure rates are extremely high if we catch it early.   The  first sign of melanoma is often a change in a mole or a new dark spot.  The ABCDE system is a way of remembering the signs of melanoma.  A for asymmetry:  The two halves do not match. B for border:  The edges of the growth are irregular. C for color:  A mixture of colors are present instead of an even brown color. D for diameter:  Melanomas are usually (but not always) greater than 6mm - the size of a pencil eraser. E for evolution:  The spot keeps changing in size, shape, and color.  Please check your skin once per month between visits. You can use a small mirror in front and a large mirror behind you to keep an eye on the back side or your body.   If you see any new or changing lesions before your next follow-up, please call to schedule a visit.  Please continue daily skin protection including broad spectrum sunscreen SPF 30+ to sun-exposed areas, reapplying every 2 hours as needed when you're outdoors.   Staying in the shade or wearing long sleeves, sun glasses (UVA+UVB protection) and wide brim hats (4-inch brim around the entire circumference of the hat) are also recommended for sun protection.    If you have any questions or concerns for your doctor, please call our main line at [phone] and press option 4 to reach your doctor's medical assistant. If no one answers, please leave a voicemail as directed and   we will return your call as soon as possible. Messages left after 4 pm will be answered the following business day.   You may also send us a message via MyChart. We typically respond to MyChart messages within 1-2 business days.  For prescription refills, please ask your pharmacy to contact our office. Our fax number is [phone].  If you have an urgent issue when the clinic is closed that cannot wait until the next business day, you can page your doctor at the number below.    Please note that while we do our best to be available for urgent issues outside of office hours, we  are not available 24/7.   If you have an urgent issue and are unable to reach us, you may choose to seek medical care at your doctor's office, retail clinic, urgent care center, or emergency room.  If you have a medical emergency, please immediately call 911 or go to the emergency department.  Pager Numbers  - Dr. Kowalski: [phone]  - Dr. Moye: [phone]  - Dr. Stewart: [phone]  In the event of inclement weather, please call our main line at [phone] for an update on the status of any delays or closures.  Dermatology Medication Tips: Please keep the boxes that topical medications come in in order to help keep track of the instructions about where and how to use these. Pharmacies typically print the medication instructions only on the boxes and not directly on the medication tubes.   If your medication is too expensive, please contact our office at [phone] option 4 or send us a message through MyChart.   We are unable to tell what your co-pay for medications will be in advance as this is different depending on your insurance coverage. However, we may be able to find a substitute medication at lower cost or fill out paperwork to get insurance to cover a needed medication.   If a prior authorization is required to get your medication covered by your insurance company, please allow us 1-2 business days to complete this process.  Drug prices often vary depending on where the prescription is filled and some pharmacies may offer cheaper prices.  The website www.goodrx.com contains coupons for medications through different pharmacies. The prices here do not account for what the cost may be with help from insurance (it may be cheaper with your insurance), but the website can give you the price if you did not use any insurance.  - You can print the associated coupon and take it with your prescription to the pharmacy.  - You may also stop by our office during regular business  hours and pick up a GoodRx coupon card.  - If you need your prescription sent electronically to a different pharmacy, notify our office through [facility] MyChart or by phone at [phone] option 4.  

## 2021-07-28 ENCOUNTER — Ambulatory Visit: Payer: BC Managed Care – PPO | Admitting: Adult Health

## 2021-07-29 DIAGNOSIS — Z113 Encounter for screening for infections with a predominantly sexual mode of transmission: Secondary | ICD-10-CM | POA: Diagnosis not present

## 2021-07-29 DIAGNOSIS — Z124 Encounter for screening for malignant neoplasm of cervix: Secondary | ICD-10-CM | POA: Diagnosis not present

## 2021-07-29 DIAGNOSIS — N930 Postcoital and contact bleeding: Secondary | ICD-10-CM | POA: Diagnosis not present

## 2021-07-29 DIAGNOSIS — R102 Pelvic and perineal pain: Secondary | ICD-10-CM | POA: Diagnosis not present

## 2021-07-29 DIAGNOSIS — Z01411 Encounter for gynecological examination (general) (routine) with abnormal findings: Secondary | ICD-10-CM | POA: Diagnosis not present

## 2021-07-29 DIAGNOSIS — Z1231 Encounter for screening mammogram for malignant neoplasm of breast: Secondary | ICD-10-CM | POA: Diagnosis not present

## 2021-07-29 DIAGNOSIS — Z6841 Body Mass Index (BMI) 40.0 and over, adult: Secondary | ICD-10-CM | POA: Diagnosis not present

## 2021-08-10 DIAGNOSIS — T781XXD Other adverse food reactions, not elsewhere classified, subsequent encounter: Secondary | ICD-10-CM | POA: Diagnosis not present

## 2021-08-10 DIAGNOSIS — J3081 Allergic rhinitis due to animal (cat) (dog) hair and dander: Secondary | ICD-10-CM | POA: Diagnosis not present

## 2021-08-10 DIAGNOSIS — J301 Allergic rhinitis due to pollen: Secondary | ICD-10-CM | POA: Diagnosis not present

## 2021-08-10 DIAGNOSIS — J3089 Other allergic rhinitis: Secondary | ICD-10-CM | POA: Diagnosis not present

## 2021-08-25 DIAGNOSIS — N930 Postcoital and contact bleeding: Secondary | ICD-10-CM | POA: Diagnosis not present

## 2021-08-25 DIAGNOSIS — D251 Intramural leiomyoma of uterus: Secondary | ICD-10-CM | POA: Diagnosis not present

## 2021-08-25 DIAGNOSIS — R102 Pelvic and perineal pain: Secondary | ICD-10-CM | POA: Diagnosis not present

## 2021-08-25 DIAGNOSIS — D252 Subserosal leiomyoma of uterus: Secondary | ICD-10-CM | POA: Diagnosis not present

## 2021-08-25 DIAGNOSIS — Z30431 Encounter for routine checking of intrauterine contraceptive device: Secondary | ICD-10-CM | POA: Diagnosis not present

## 2021-10-08 DIAGNOSIS — F331 Major depressive disorder, recurrent, moderate: Secondary | ICD-10-CM | POA: Diagnosis not present

## 2021-10-19 DIAGNOSIS — F331 Major depressive disorder, recurrent, moderate: Secondary | ICD-10-CM | POA: Diagnosis not present

## 2021-10-26 DIAGNOSIS — F331 Major depressive disorder, recurrent, moderate: Secondary | ICD-10-CM | POA: Diagnosis not present

## 2021-11-02 DIAGNOSIS — F331 Major depressive disorder, recurrent, moderate: Secondary | ICD-10-CM | POA: Diagnosis not present

## 2021-11-08 DIAGNOSIS — S93501A Unspecified sprain of right great toe, initial encounter: Secondary | ICD-10-CM | POA: Diagnosis not present

## 2021-11-12 DIAGNOSIS — F331 Major depressive disorder, recurrent, moderate: Secondary | ICD-10-CM | POA: Diagnosis not present

## 2021-11-15 DIAGNOSIS — F331 Major depressive disorder, recurrent, moderate: Secondary | ICD-10-CM | POA: Diagnosis not present

## 2021-11-22 DIAGNOSIS — F331 Major depressive disorder, recurrent, moderate: Secondary | ICD-10-CM | POA: Diagnosis not present

## 2021-11-23 ENCOUNTER — Encounter (HOSPITAL_COMMUNITY): Payer: Self-pay

## 2021-11-23 ENCOUNTER — Emergency Department (HOSPITAL_COMMUNITY)
Admission: EM | Admit: 2021-11-23 | Discharge: 2021-11-24 | Disposition: A | Discharge status: Home / Self Care | Payer: BC Managed Care – PPO | Attending: Student | Admitting: Student

## 2021-11-23 ENCOUNTER — Other Ambulatory Visit: Payer: Self-pay

## 2021-11-23 ENCOUNTER — Emergency Department (HOSPITAL_COMMUNITY): Payer: BC Managed Care – PPO

## 2021-11-23 DIAGNOSIS — N9489 Other specified conditions associated with female genital organs and menstrual cycle: Secondary | ICD-10-CM | POA: Insufficient documentation

## 2021-11-23 DIAGNOSIS — Z85850 Personal history of malignant neoplasm of thyroid: Secondary | ICD-10-CM | POA: Insufficient documentation

## 2021-11-23 DIAGNOSIS — J45909 Unspecified asthma, uncomplicated: Secondary | ICD-10-CM | POA: Insufficient documentation

## 2021-11-23 DIAGNOSIS — R0789 Other chest pain: Secondary | ICD-10-CM | POA: Diagnosis not present

## 2021-11-23 DIAGNOSIS — R0902 Hypoxemia: Secondary | ICD-10-CM | POA: Diagnosis not present

## 2021-11-23 DIAGNOSIS — Z87891 Personal history of nicotine dependence: Secondary | ICD-10-CM | POA: Insufficient documentation

## 2021-11-23 DIAGNOSIS — R0602 Shortness of breath: Secondary | ICD-10-CM | POA: Diagnosis not present

## 2021-11-23 DIAGNOSIS — Z20822 Contact with and (suspected) exposure to covid-19: Secondary | ICD-10-CM | POA: Diagnosis not present

## 2021-11-23 DIAGNOSIS — R42 Dizziness and giddiness: Secondary | ICD-10-CM | POA: Diagnosis not present

## 2021-11-23 DIAGNOSIS — Z79899 Other long term (current) drug therapy: Secondary | ICD-10-CM | POA: Diagnosis not present

## 2021-11-23 DIAGNOSIS — R079 Chest pain, unspecified: Secondary | ICD-10-CM | POA: Diagnosis not present

## 2021-11-23 DIAGNOSIS — E039 Hypothyroidism, unspecified: Secondary | ICD-10-CM | POA: Diagnosis not present

## 2021-11-23 LAB — BASIC METABOLIC PANEL
Anion gap: 9 (ref 5–15)
BUN: 14 mg/dL (ref 6–20)
CO2: 28 mmol/L (ref 22–32)
Calcium: 9.5 mg/dL (ref 8.9–10.3)
Chloride: 102 mmol/L (ref 98–111)
Creatinine, Ser: 1.2 mg/dL — ABNORMAL HIGH (ref 0.44–1.00)
GFR, Estimated: 58 mL/min — ABNORMAL LOW (ref >60)
Glucose, Bld: 88 mg/dL (ref 70–99)
Potassium: 3.8 mmol/L (ref 3.5–5.1)
Sodium: 139 mmol/L (ref 135–145)

## 2021-11-23 LAB — CBC
HCT: 42.6 % (ref 36.0–46.0)
Hemoglobin: 13.2 g/dL (ref 12.0–15.0)
MCH: 29.3 pg (ref 26.0–34.0)
MCHC: 31 g/dL (ref 30.0–36.0)
MCV: 94.7 fL (ref 80.0–100.0)
Platelets: 249 10*3/uL (ref 150–400)
RBC: 4.5 MIL/uL (ref 3.87–5.11)
RDW: 13.8 % (ref 11.5–15.5)
WBC: 8 10*3/uL (ref 4.0–10.5)
nRBC: 0 % (ref 0.0–0.2)

## 2021-11-23 LAB — I-STAT BETA HCG BLOOD, ED (MC, WL, AP ONLY): I-stat hCG, quantitative: <5 m[IU]/mL (ref <5)

## 2021-11-23 NOTE — ED Triage Notes (Signed)
Pt BIBA via [facility]. Pt endorsing sudden CP around 1700 this evening. Pt state pain is sharp and on left side. Pt states pain radiates to back, neck and arm. Pt also endorsing slight numbness and tingling. PTA pt given 2x Nitro with no relief and 324 ASA PO. Vitals per medic: BP: 140/86, HR: 82, 95% (RA), CBG: 120

## 2021-11-23 NOTE — ED Provider Notes (Addendum)
Emergency Medicine Provider Triage Evaluation Note  Sharon Benson , a 45 y.o. female  was evaluated in triage.  Pt complains of 45 year old female presents today for chest pain onset around 5 PM as she was walking into the house.  She reports it was a sharp left-sided pain that radiated to her left shoulder, left shoulder blade, down her left arm, and left side of her neck.  Initially she was without diaphoresis, lightheadedness, nausea, or abdominal pain.  She reports she became nauseous after receiving aspirin and nitro in route.  She reports her chest tightness has gotten worse since onset.  Denies previous cardiac history.  Review of Systems  Positive: As above Negative: Shortness of breath, fever  Physical Exam  BP 124/88 (BP Location: Left Arm)    Pulse 82    Temp 98.5 F (36.9 C) (Oral)    Resp (!) 26    Ht 5\' 4"  (1.626 m)    Wt 126.1 kg    SpO2 96%    BMI 47.72 kg/m  Gen:   Awake, no distress   Resp:  Normal effort  MSK:   Moves extremities without difficulty  Other:  Blood pressure symmetrical in bilateral upper extremities.  Medical Decision Making  Medically screening exam initiated at 8:22 PM.  Appropriate orders placed.  [name] was informed that the remainder of the evaluation will be completed by another provider, this initial triage assessment does not replace that evaluation, and the importance of remaining in the ED until their evaluation is complete.     Evlyn Courier, PA-C 11/23/21 2024    Evlyn Courier, PA-C 11/23/21 2024    Lacretia Leigh, MD 11/26/21 [phone]

## 2021-11-24 ENCOUNTER — Emergency Department (HOSPITAL_COMMUNITY): Payer: BC Managed Care – PPO

## 2021-11-24 DIAGNOSIS — R079 Chest pain, unspecified: Secondary | ICD-10-CM | POA: Diagnosis not present

## 2021-11-24 DIAGNOSIS — R0602 Shortness of breath: Secondary | ICD-10-CM | POA: Diagnosis not present

## 2021-11-24 LAB — TROPONIN I (HIGH SENSITIVITY): Troponin I (High Sensitivity): 4 ng/L (ref <18)

## 2021-11-24 LAB — RESP PANEL BY RT-PCR (FLU A&B, COVID) ARPGX2
Influenza A by PCR: NEGATIVE
Influenza B by PCR: NEGATIVE
SARS Coronavirus 2 by RT PCR: NEGATIVE

## 2021-11-24 LAB — D-DIMER, QUANTITATIVE: D-Dimer, Quant: 5.43 ug/mL-FEU — ABNORMAL HIGH (ref 0.00–0.50)

## 2021-11-24 MED ORDER — ALUM & MAG HYDROXIDE-SIMETH 200-200-20 MG/5ML PO SUSP
30.0000 mL | Freq: Once | ORAL | Status: AC
  Administered 2021-11-24: 14:00:00 30 mL via ORAL
  Filled 2021-11-24: qty 30

## 2021-11-24 MED ORDER — LIDOCAINE 5 % EX PTCH
1.0000 | MEDICATED_PATCH | CUTANEOUS | Status: DC
Start: 2021-11-24 — End: 2021-11-24
  Administered 2021-11-24: 12:00:00 1 via TRANSDERMAL
  Filled 2021-11-24: qty 1

## 2021-11-24 MED ORDER — IOHEXOL 350 MG/ML SOLN
65.0000 mL | Freq: Once | INTRAVENOUS | Status: AC | PRN
  Administered 2021-11-24: 13:00:00 65 mL via INTRAVENOUS

## 2021-11-24 MED ORDER — LIDOCAINE VISCOUS HCL 2 % MT SOLN
15.0000 mL | Freq: Once | OROMUCOSAL | Status: AC
  Administered 2021-11-24: 14:00:00 15 mL via ORAL
  Filled 2021-11-24: qty 15

## 2021-11-24 MED ORDER — KETOROLAC TROMETHAMINE 15 MG/ML IJ SOLN
15.0000 mg | Freq: Once | INTRAMUSCULAR | Status: AC
  Administered 2021-11-24: 13:00:00 15 mg via INTRAVENOUS

## 2021-11-24 MED ORDER — KETOROLAC TROMETHAMINE 15 MG/ML IJ SOLN
15.0000 mg | Freq: Once | INTRAMUSCULAR | Status: DC
  Filled 2021-11-24: qty 1

## 2021-11-24 NOTE — ED Notes (Signed)
Pt verbalizes understanding of discharge instructions. Opportunity for questions and answers were provided. Pt discharged from the ED.   ?

## 2021-11-24 NOTE — ED Notes (Signed)
Pt states she does not want pain medication until she eats. Pt given [place] sandwich, water, cheese, and crackers.

## 2021-11-24 NOTE — ED Provider Notes (Signed)
[facility] EMERGENCY DEPARTMENT Provider Note   CSN: [id number] Arrival date & time: 11/23/21  1954     History Chief Complaint  Patient presents with   Chest Pain    Marry Kusch is a 45 y.o. female with PMH asthma, endometriosis, thyroid cancer DOS, anxiety who presents emergency department for evaluation of chest pain, chest tightness and shortness of breath.  Patient states that at approximately 1700 on 11/23/2021 she had acute onset central chest pain that radiates to the left shoulder and into the left neck.  The pain persisted into the night bring her into the emergency department for evaluation.  She denies associated diaphoresis, nausea, vomiting or abdominal pain.  She received 2 nitro and aspirin by EMS prior to arrival with no alleviation of symptoms.  Denies associated numbness, tingling, weakness or other neurologic complaints.  Denies cough, fever, diarrhea or other systemic complaints.   Chest Pain Associated symptoms: shortness of breath   Associated symptoms: no abdominal pain, no back pain, no cough, no fever, no palpitations and no vomiting       Past Medical History:  Diagnosis Date   Anxiety    panic attack - not recent 2001ish   Arthritis    Asthma    Cancer ([place])    thyroid cancer 2016   Dizziness    Endometriosis    Fibroid    GERD (gastroesophageal reflux disease)    Headache(784.0)    History of multiple miscarriages    Hypothyroidism    Insomnia    Palpitations    PCOS (polycystic ovarian syndrome)    PONV (postoperative nausea and vomiting)    last 2- no vomiting-  Seizures- not with last 2.   Seizures ([place])    last seizure 04/2017   SVD (spontaneous vaginal delivery)    x 1   Vertigo    Vitamin D deficiency     Patient Active Problem List   Diagnosis Date Noted   FHx: colon cancer 09/19/2019   Gastroesophageal reflux disease 09/19/2019   Dysphagia 09/19/2019   Change in bowel habit 09/19/2019   Anemia  09/19/2019   Throat clearing 09/19/2019   Hypothyroidism 01/07/2018   Rupture of membranes with delay of delivery 01/05/2018   Obesity affecting pregnancy, antepartum, third trimester 01/05/2018   SVD (spontaneous vaginal delivery) 01/05/2018   Preterm labor in third trimester 11/17/2017   Asthma affecting pregnancy, antepartum 08/03/2017   SOB (shortness of breath) 08/03/2017   Feeling of chest tightness 08/03/2017   Fibroids 06/03/2017   AMA (advanced maternal age) multigravida 35+ 06/03/2017   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   Morbid obesity with BMI of 45.0-49.9, adult ([place]) 06/03/2017   Palpitations 11/09/2015   Thyroid mass 01/02/2015   GOITER, UNSPECIFIED 07/20/2010   MIGRAINE HEADACHE 07/20/2010   SEIZURE DISORDER, HX OF 07/20/2010    Past Surgical History:  Procedure Laterality Date   [place] AND EVACUATION N/A 11/25/2013   Procedure: DILATATION AND EVACUATION;  Surgeon: Woodroe Mode, MD;  Location: [facility];  Service: Gynecology;  Laterality: N/A;   DILATION AND EVACUATION N/A 11/30/2013   Procedure: DILATATION AND EVACUATION;  Surgeon: Jonnie Kind, MD;  Location: [facility];  Service: Gynecology;  Laterality: N/A;   DILATION AND EVACUATION N/A 05/02/2014   Procedure: DILATATION AND EVACUATION;  Surgeon: Ena Dawley, MD;  Location: [facility];  Service: Gynecology;  Laterality: N/A;  GANGLION CYST EXCISION  1999   right hand   mirena     REDUCTION MAMMAPLASTY Bilateral    THYROIDECTOMY Bilateral 01/02/2015   Procedure: TOTAL THYROIDECTOMY;  Surgeon: Melissa Montane, MD;  Location: [facility];  Service: ENT;  Laterality: Bilateral;     OB History     Gravida  5   Para  2   Term  2   Preterm      AB  3   Living  2      SAB  3   IAB      Ectopic      Multiple  0   Live Births  2           Family History  Problem Relation Age of Onset   Lung cancer Maternal Grandmother    Colon  cancer Maternal Grandmother    Cancer Other    Diabetes Other    High blood pressure Other    Diabetes Mother    Hypertension Father    Colon cancer Father        Dx at age 9 and it spread to his lungs, liver and bones   Hypertension Paternal Grandmother    Diabetes Paternal Grandmother    Heart disease Paternal Grandmother    Esophageal cancer Neg Hx    Liver disease Neg Hx    Pancreatic cancer Neg Hx    Stomach cancer Neg Hx     Social History   Tobacco Use   Smoking status: Former    Years: 2.00    Types: Cigarettes    Quit date: 04/28/1997    Years since quitting: 24.5   Smokeless tobacco: Never   Tobacco comments:    smoked in HS  Vaping Use   Vaping Use: Never used  Substance Use Topics   Alcohol use: No   Drug use: No    Types: Marijuana    Comment: 18 yrs ago    Home Medications Prior to Admission medications   Medication Sig Start Date End Date Taking? Authorizing Provider  albuterol (PROVENTIL HFA;VENTOLIN HFA) 108 (90 Base) MCG/ACT inhaler Inhale 2 puffs into the lungs every 4 (four) hours as needed for wheezing or shortness of breath. 12/10/16   Muthersbaugh, Jarrett Soho, PA-C  Cetirizine HCl (ZYRTEC ALLERGY) 10 MG CAPS Take 1 capsule (10 mg total) by mouth daily. 03/26/21   Khatri, Hina, PA-C  Cholecalciferol (VITAMIN D-3) 5000 UNITS TABS Take 10,000 Units by mouth every evening.    [provider]  esomeprazole (NEXIUM) 40 MG capsule Take 1 capsule (40 mg total) by mouth every evening. Patient not taking: No sig reported 09/17/19   Mansouraty, Telford Nab., MD  levonorgestrel (MIRENA) 20 MCG/24HR IUD 1 each by Intrauterine route continuous.    [provider]  levothyroxine (SYNTHROID) 200 MCG tablet Take 200 mcg by mouth daily before breakfast.    [provider]  meloxicam (MOBIC) 15 MG tablet Take 15 mg by mouth daily as needed for pain.    [provider]  SAXENDA 18 MG/3ML SOPN Inject 18 mg into the skin daily. Patient  not taking: Reported on 05/17/2021 02/09/21   [provider]  thyroid (ARMOUR) 60 MG tablet Take 60 mg by mouth daily before breakfast.    [provider]    Allergies    Other, Cocoa butter, and Codeine  Review of Systems   Review of Systems  Constitutional:  Negative for chills and fever.  HENT:  Negative for ear  pain and sore throat.   Eyes:  Negative for pain and visual disturbance.  Respiratory:  Positive for chest tightness and shortness of breath. Negative for cough.   Cardiovascular:  Positive for chest pain. Negative for palpitations.  Gastrointestinal:  Negative for abdominal pain and vomiting.  Genitourinary:  Negative for dysuria and hematuria.  Musculoskeletal:  Negative for arthralgias and back pain.  Skin:  Negative for color change and rash.  Neurological:  Negative for seizures and syncope.  All other systems reviewed and are negative.  Physical Exam Updated Vital Signs BP 114/79    Pulse 64    Temp 97.8 F (36.6 C) (Oral)    Resp 18    Ht 5\' 4"  (1.626 m)    Wt 126.1 kg    SpO2 98%    BMI 47.72 kg/m   Physical Exam Vitals and nursing note reviewed.  Constitutional:      General: She is not in acute distress.    Appearance: She is well-developed.  HENT:     Head: Normocephalic and atraumatic.  Eyes:     Conjunctiva/sclera: Conjunctivae normal.  Cardiovascular:     Rate and Rhythm: Normal rate and regular rhythm.     Heart sounds: No murmur heard. Pulmonary:     Effort: Pulmonary effort is normal. No respiratory distress.     Breath sounds: Normal breath sounds.  Abdominal:     Palpations: Abdomen is soft.     Tenderness: There is no abdominal tenderness.  Musculoskeletal:        General: No swelling.     Cervical back: Neck supple.  Skin:    General: Skin is warm and dry.     Capillary Refill: Capillary refill takes less than 2 seconds.  Neurological:     Mental Status: She is alert.  Psychiatric:        Mood and Affect: Mood  normal.    ED Results / Procedures / Treatments   Labs (all labs ordered are listed, but only abnormal results are displayed) Labs Reviewed  BASIC METABOLIC PANEL - Abnormal; Notable for the following components:      Result Value   Creatinine, Ser 1.20 (*)    GFR, Estimated 58 (*)    All other components within normal limits  D-DIMER, QUANTITATIVE - Abnormal; Notable for the following components:   D-Dimer, Quant 5.43 (*)    All other components within normal limits  RESP PANEL BY RT-PCR (FLU A&B, COVID) ARPGX2  CBC  I-STAT BETA HCG BLOOD, ED ([facility], [facility], [facility] ONLY)  TROPONIN I (HIGH SENSITIVITY)    EKG EKG Interpretation  Date/Time:  Tuesday November 23 2021 20:01:36 EST Ventricular Rate:  73 PR Interval:  152 QRS Duration: 92 QT Interval:  390 QTC Calculation: 429 R Axis:   80 Text Interpretation: Normal sinus rhythm Normal ECG Confirmed by Johndaniel Catlin ([id number]) on 11/24/2021 10:58:44 AM  Radiology DG Chest 2 View  Result Date: 11/23/2021 CLINICAL DATA:  Chest pain EXAM: CHEST - 2 VIEW COMPARISON:  03/26/2021 FINDINGS: The lungs are clear without focal pneumonia, edema, pneumothorax or pleural effusion. The cardiopericardial silhouette is within normal limits for size. The visualized bony structures of the thorax show no acute abnormality. IMPRESSION: No active cardiopulmonary disease. Electronically Signed   By: Misty Stanley M.D.   On: 11/23/2021 20:40    Procedures Procedures   Medications Ordered in ED Medications  lidocaine (LIDODERM) 5 % 1 patch (1 patch Transdermal Patch Applied 11/24/21 1131)  ketorolac (TORADOL)  15 MG/ML injection 15 mg (15 mg Intravenous Given 11/24/21 1235)    ED Course  I have reviewed the triage vital signs and the nursing notes.  Pertinent labs & imaging results that were available during my care of the patient were reviewed by me and considered in my medical decision making (see chart for details).    MDM Rules/Calculators/A&P                            Patient seen emergency department for evaluation of chest pain tightness.  Physical exam with reproducible tenderness over the rib space under the xiphoid process and over the epigastrium.  Laboratory evaluation unremarkable including a troponin and negative high-sensitivity troponin.  COVID-negative, pregnancy negative.  D-dimer elevated to 5.43 and thus a CT PE was performed that was reassuringly negative for PE or any other acute intracranial pathology.  Patient symptoms improved with Toradol and lidocaine patch, chest tightness improved further after GI cocktail.  Patient able to ambulate without difficulty here in the emergency department with out reproduction of her chest pain.  Heart score of 2.  Low suspicion for ACS at this time.  Patient safe for discharge and given strict return precautions.  She does have a cardiologist and will call him for outpatient follow-up. Final Clinical Impression(s) / ED Diagnoses Final diagnoses:  None    Rx / DC Orders ED Discharge Orders     None        Mikele Sifuentes, Debe Coder, MD 11/24/21 1541

## 2021-11-24 NOTE — ED Notes (Signed)
Pt ambulatory without difficulty. 

## 2021-11-30 DIAGNOSIS — F331 Major depressive disorder, recurrent, moderate: Secondary | ICD-10-CM | POA: Diagnosis not present

## 2021-12-01 DIAGNOSIS — E890 Postprocedural hypothyroidism: Secondary | ICD-10-CM | POA: Diagnosis not present

## 2021-12-01 DIAGNOSIS — N943 Premenstrual tension syndrome: Secondary | ICD-10-CM | POA: Diagnosis not present

## 2021-12-01 DIAGNOSIS — E669 Obesity, unspecified: Secondary | ICD-10-CM | POA: Diagnosis not present

## 2021-12-01 DIAGNOSIS — R5381 Other malaise: Secondary | ICD-10-CM | POA: Diagnosis not present

## 2021-12-01 DIAGNOSIS — E559 Vitamin D deficiency, unspecified: Secondary | ICD-10-CM | POA: Diagnosis not present

## 2021-12-01 DIAGNOSIS — N926 Irregular menstruation, unspecified: Secondary | ICD-10-CM | POA: Diagnosis not present

## 2021-12-01 DIAGNOSIS — J45909 Unspecified asthma, uncomplicated: Secondary | ICD-10-CM | POA: Diagnosis not present

## 2021-12-01 DIAGNOSIS — E039 Hypothyroidism, unspecified: Secondary | ICD-10-CM | POA: Diagnosis not present

## 2021-12-01 DIAGNOSIS — R5383 Other fatigue: Secondary | ICD-10-CM | POA: Diagnosis not present

## 2021-12-14 DIAGNOSIS — F331 Major depressive disorder, recurrent, moderate: Secondary | ICD-10-CM | POA: Diagnosis not present

## 2021-12-21 DIAGNOSIS — F331 Major depressive disorder, recurrent, moderate: Secondary | ICD-10-CM | POA: Diagnosis not present

## 2022-01-04 DIAGNOSIS — F331 Major depressive disorder, recurrent, moderate: Secondary | ICD-10-CM | POA: Diagnosis not present

## 2022-01-11 DIAGNOSIS — F331 Major depressive disorder, recurrent, moderate: Secondary | ICD-10-CM | POA: Diagnosis not present

## 2022-01-12 ENCOUNTER — Other Ambulatory Visit: Payer: Self-pay

## 2022-01-12 ENCOUNTER — Emergency Department (HOSPITAL_COMMUNITY)
Admission: EM | Admit: 2022-01-12 | Discharge: 2022-01-12 | Disposition: A | Discharge status: Home / Self Care | Payer: BC Managed Care – PPO | Attending: Emergency Medicine | Admitting: Emergency Medicine

## 2022-01-12 DIAGNOSIS — U071 COVID-19: Secondary | ICD-10-CM | POA: Insufficient documentation

## 2022-01-12 DIAGNOSIS — B349 Viral infection, unspecified: Secondary | ICD-10-CM

## 2022-01-12 DIAGNOSIS — R509 Fever, unspecified: Secondary | ICD-10-CM | POA: Diagnosis not present

## 2022-01-12 LAB — RESP PANEL BY RT-PCR (FLU A&B, COVID) ARPGX2
Influenza A by PCR: NEGATIVE
Influenza B by PCR: NEGATIVE
SARS Coronavirus 2 by RT PCR: POSITIVE — AB

## 2022-01-12 MED ORDER — BENZONATATE 100 MG PO CAPS: 100.0000 mg | ORAL_CAPSULE | Freq: Three times a day (TID) | ORAL | 0 refills | Status: DC

## 2022-01-12 MED ORDER — ONDANSETRON 4 MG PO TBDP: 4.0000 mg | ORAL_TABLET | Freq: Three times a day (TID) | ORAL | 0 refills | Status: DC | PRN

## 2022-01-12 MED ORDER — ACETAMINOPHEN 500 MG PO TABS
1000.0000 mg | ORAL_TABLET | Freq: Once | ORAL | Status: AC
  Administered 2022-01-12: 1000 mg via ORAL
  Filled 2022-01-12: qty 2

## 2022-01-12 NOTE — ED Provider Triage Note (Signed)
Emergency Medicine Provider Triage Evaluation Note  Leyda Vanderwerf , a 46 y.o. female  was evaluated in triage.  Pt complains of feeling unwell.  Patient with cough, rhinorrhea, fever, myalgias, headache.  Daughter here sick with similar complaints.  Last meds around 9 AM this morning.  Review of Systems  Positive: Fever, cough, headache, rhinorrhea Negative:   Physical Exam  There were no vitals taken for this visit. Gen:   Awake, no distress   Resp:  Normal effort  MSK:   Moves extremities without difficulty  Other:    Medical Decision Making  Medically screening exam initiated at 7:23 PM.  Appropriate orders placed.  Sanvika Cuttino was informed that the remainder of the evaluation will be completed by another provider, this initial triage assessment does not replace that evaluation, and the importance of remaining in the ED until their evaluation is complete.  Fever, cough, rhinorrhea   Lindsi Bayliss A, PA-C 01/12/22 1923

## 2022-01-12 NOTE — ED Provider Notes (Signed)
[facility] DEPT Provider Note   CSN: [id number] Arrival date & time: 01/12/22  1912     History  Chief Complaint  Patient presents with   Fever    Sharon Benson is a 46 y.o. female here for evaluation of feeling unwell.  Family here with similar symptoms.  Patient with fever, cough, congestion, rhinorrhea, headache.  States she has been fatigued all day.  OTC meds at home.  No vomiting, abdominal pain.  Occasionally has some shortness of breath with cough, no exertional or pleuritic chest pain.  No lower extremity swelling.  Denies chance of pregnancy.  HPI     Home Medications Prior to Admission medications   Medication Sig Start Date End Date Taking? Authorizing Provider  benzonatate (TESSALON) 100 MG capsule Take 1 capsule (100 mg total) by mouth every 8 (eight) hours. 01/12/22  Yes Krystian Younglove A, PA-C  ondansetron (ZOFRAN-ODT) 4 MG disintegrating tablet Take 1 tablet (4 mg total) by mouth every 8 (eight) hours as needed for nausea or vomiting. 01/12/22  Yes Hatsumi Steinhart A, PA-C  albuterol (PROVENTIL HFA;VENTOLIN HFA) 108 (90 Base) MCG/ACT inhaler Inhale 2 puffs into the lungs every 4 (four) hours as needed for wheezing or shortness of breath. 12/10/16   Muthersbaugh, Jarrett Soho, PA-C  Cetirizine HCl (ZYRTEC ALLERGY) 10 MG CAPS Take 1 capsule (10 mg total) by mouth daily. Patient not taking: Reported on 11/24/2021 03/26/21   Delia Heady, PA-C  EPINEPHrine 0.3 mg/0.3 mL IJ SOAJ injection Inject 0.3 mg into the muscle as needed for anaphylaxis. 08/11/21   [provider]  esomeprazole (NEXIUM) 40 MG capsule Take 1 capsule (40 mg total) by mouth every evening. Patient not taking: Reported on 08/18/2020 09/17/19   Mansouraty, Telford Nab., MD  levonorgestrel (MIRENA) 20 MCG/24HR IUD 1 each by Intrauterine route continuous.    [provider]  levothyroxine (SYNTHROID) 200 MCG tablet Take 200 mcg by mouth daily before breakfast.     [provider]  Liraglutide -Weight Management (SAXENDA) 18 MG/3ML SOPN Inject 1.8 mg into the skin daily.    [provider]  thyroid (ARMOUR) 60 MG tablet Take 60 mg by mouth daily before breakfast.    [provider]      Allergies    Other, Cocoa butter, and Codeine    Review of Systems   Review of Systems  Constitutional:  Positive for activity change, appetite change, fatigue and fever.  HENT:  Positive for congestion, postnasal drip and rhinorrhea. Negative for sinus pressure, sinus pain, sore throat, trouble swallowing and voice change.   Respiratory:  Positive for cough and shortness of breath (with cough).   Cardiovascular: Negative.   Gastrointestinal: Negative.   Genitourinary: Negative.   Musculoskeletal:  Positive for myalgias.  Neurological:  Positive for weakness (generalized) and headaches. Negative for dizziness, tremors, seizures, facial asymmetry, speech difficulty and numbness.  All other systems reviewed and are negative.  Physical Exam Updated Vital Signs There were no vitals taken for this visit. Physical Exam Vitals and nursing note reviewed.  Constitutional:      General: She is not in acute distress.    Appearance: She is well-developed. She is not ill-appearing, toxic-appearing or diaphoretic.  HENT:     Head: Normocephalic and atraumatic.     Nose: Rhinorrhea present.     Mouth/Throat:     Mouth: Mucous membranes are moist.  Eyes:     Pupils: Pupils are equal, round, and reactive to light.  Cardiovascular:  Rate and Rhythm: Normal rate.     Pulses: Normal pulses.     Heart sounds: Normal heart sounds.  Pulmonary:     Effort: Pulmonary effort is normal. No respiratory distress.     Breath sounds: Normal breath sounds.  Abdominal:     General: Bowel sounds are normal. There is no distension.     Palpations: Abdomen is soft.  Musculoskeletal:        General: No swelling, tenderness, deformity or signs of injury.  Normal range of motion.     Cervical back: Normal range of motion.     Right lower leg: No edema.     Left lower leg: No edema.  Skin:    General: Skin is warm and dry.     Capillary Refill: Capillary refill takes less than 2 seconds.  Neurological:     General: No focal deficit present.     Mental Status: She is alert and oriented to person, place, and time.  Psychiatric:        Mood and Affect: Mood normal.   ED Results / Procedures / Treatments   Labs (all labs ordered are listed, but only abnormal results are displayed) Labs Reviewed  RESP PANEL BY RT-PCR (FLU A&B, COVID) [id number]    EKG None  Radiology No results found.  Procedures Procedures    Medications Ordered in ED Medications  acetaminophen (TYLENOL) tablet 1,000 mg (1,000 mg Oral Given 01/12/22 1931)   ED Course/ Medical Decision Making/ A&P   Pleasant 46 year old here for evaluation of feeling unwell.  Family here with similar symptoms.  Patient with headache, cough, congestion, rhinorrhea, myalgias.  She has a nonfocal neuro exam without deficits.  Heart and lungs clear.  No neck stiffness or neck rigidity.  Does not appear clinically fluid overloaded.  Patient's COVID test still pending however family's is positive, suspect similar illness.  We will treat as such.  We will have her follow-up outpatient, return for new or worsening symptoms.  The patient has been appropriately medically screened and/or stabilized in the ED. I have low suspicion for any other emergent medical condition which would require further screening, evaluation or treatment in the ED or require inpatient management.  Patient is hemodynamically stable and in no acute distress.  Patient able to ambulate in department prior to ED.  Evaluation does not show acute pathology that would require ongoing or additional emergent interventions while in the emergency department or further inpatient treatment.  I have discussed the diagnosis with the  patient and answered all questions.  Pain is been managed while in the emergency department and patient has no further complaints prior to discharge.  Patient is comfortable with plan discussed in room and is stable for discharge at this time.  I have discussed strict return precautions for returning to the emergency department.  Patient was encouraged to follow-up with PCP/specialist refer to at discharge.                           Medical Decision Making Amount and/or Complexity of Data Reviewed Labs: ordered. Decision-making details documented in ED Course.  Risk OTC drugs. Prescription drug management. Risk Details: Do not feel patient needs additional labs, imaging or hospitalization at this time         Final Clinical Impression(s) / ED Diagnoses Final diagnoses:  Viral illness    Rx / DC Orders ED Discharge Orders  Ordered    benzonatate (TESSALON) 100 MG capsule  Every 8 hours        01/12/22 2122    ondansetron (ZOFRAN-ODT) 4 MG disintegrating tablet  Every 8 hours PRN        01/12/22 2122              Cassiopeia Florentino A, PA-C 01/12/22 2124    Drenda Freeze, MD 01/12/22 2242

## 2022-01-12 NOTE — Discharge Instructions (Addendum)
Tessalon Perles for cough, Zofran for vomiting  Suspect COVID  Return for new or worsening symptoms

## 2022-01-12 NOTE — ED Notes (Signed)
An After Visit Summary was printed and given to the patient. Discharge instructions given and no further questions at this time.  Pt leaving with her daughter, who was also seen as a patient.

## 2022-01-12 NOTE — ED Triage Notes (Signed)
Patient reporting cough, fever, runny nose, and joint pain over the last week. Daughter sick as well.

## 2022-01-18 DIAGNOSIS — F331 Major depressive disorder, recurrent, moderate: Secondary | ICD-10-CM | POA: Diagnosis not present

## 2022-01-24 DIAGNOSIS — F331 Major depressive disorder, recurrent, moderate: Secondary | ICD-10-CM | POA: Diagnosis not present

## 2022-01-24 DIAGNOSIS — Z20822 Contact with and (suspected) exposure to covid-19: Secondary | ICD-10-CM | POA: Diagnosis not present

## 2022-01-25 DIAGNOSIS — F331 Major depressive disorder, recurrent, moderate: Secondary | ICD-10-CM | POA: Diagnosis not present

## 2022-02-01 DIAGNOSIS — F331 Major depressive disorder, recurrent, moderate: Secondary | ICD-10-CM | POA: Diagnosis not present

## 2022-02-03 DIAGNOSIS — J370 Chronic laryngitis: Secondary | ICD-10-CM | POA: Diagnosis not present

## 2022-02-11 DIAGNOSIS — F331 Major depressive disorder, recurrent, moderate: Secondary | ICD-10-CM | POA: Diagnosis not present

## 2022-02-14 DIAGNOSIS — R490 Dysphonia: Secondary | ICD-10-CM | POA: Diagnosis not present

## 2022-02-14 DIAGNOSIS — Z8616 Personal history of COVID-19: Secondary | ICD-10-CM | POA: Diagnosis not present

## 2022-02-14 DIAGNOSIS — E669 Obesity, unspecified: Secondary | ICD-10-CM | POA: Diagnosis not present

## 2022-02-14 DIAGNOSIS — Z85850 Personal history of malignant neoplasm of thyroid: Secondary | ICD-10-CM | POA: Diagnosis not present

## 2022-02-15 DIAGNOSIS — F331 Major depressive disorder, recurrent, moderate: Secondary | ICD-10-CM | POA: Diagnosis not present

## 2022-02-22 DIAGNOSIS — F331 Major depressive disorder, recurrent, moderate: Secondary | ICD-10-CM | POA: Diagnosis not present

## 2022-02-24 ENCOUNTER — Encounter: Payer: Self-pay | Admitting: Speech Pathology

## 2022-02-24 ENCOUNTER — Ambulatory Visit: Payer: BC Managed Care – PPO | Attending: Otolaryngology | Admitting: Speech Pathology

## 2022-02-24 ENCOUNTER — Other Ambulatory Visit: Payer: Self-pay

## 2022-02-24 DIAGNOSIS — R1310 Dysphagia, unspecified: Secondary | ICD-10-CM | POA: Insufficient documentation

## 2022-02-24 DIAGNOSIS — R4702 Dysphasia: Secondary | ICD-10-CM | POA: Insufficient documentation

## 2022-02-24 DIAGNOSIS — R490 Dysphonia: Secondary | ICD-10-CM | POA: Insufficient documentation

## 2022-02-24 NOTE — Patient Instructions (Signed)
Laryngeal massage: try massaging your larynx area (adams apple) for 2-3 minutes 3x a day to relieve some muscle tension  ? ?Vocal Hygiene: Protecting Your Voice ?The following is a list of activities to reduce or avoid when possible. We understand that you may not be able to avoid all of the activities, but try to the extent able. This is particularly important for patients with nodules or polyps as it will help avoid further damage and give your vocal folds the opportunity to heal. ? ?Avoid screaming, yelling, or shouting. These activities can damage the vocal folds. If you feel hoarse after yelling or screaming it is a sign of swollen and inflamed vocal folds.  ?Try not to compete with a noisy background. Turn off the TV or radio when talking.  ?Reduce the distance you are speaking over. Use the ?Three Foot Rule? and try to stand within three feet of your conversational partner.  ?Limit the amount of time talking if possible. Rest your voice after a long day of talking or teaching. Try to take breaks throughout your day if possible. ?Warm up your voice before singing. Your singing instructor or speech pathologist can provide suitable exercises. ?Avoid singing in the car. While in the car, you have to sing over the road and engine noise and can strain your voice without realizing.  ?Be aware of your breathing. Try to breathe through your nose and not your mouth. If your environment is dry, consider a humidifier. ?Reduce throat clearing. When you feel the need to clear your throat try a sip of water or a hard swallow instead. ?Reduce or eliminate caffeinated and alcoholic beverages. There are diuretics and can dehydrate your vocal folds.  ?Drink 8-10 glasses of water each day. Drink additional water for caffeinated beverages. ?Avoid airborne irritants, including cigarette smoke. If you find cleaning products irritating, try switching products or wearing a mask while cleaning.  ?Do not use strained vocal productions  (for example trying to talk or yell while lifting weights).  ?Consider a voice amplification device when speaking in noise or to large groups to avoid straining your voice.  ?

## 2022-02-24 NOTE — Therapy (Signed)
?OUTPATIENT SPEECH LANGUAGE PATHOLOGY VOICE EVALUATION ? ? ?Patient Name: Sharon Benson ?MRN: [id number] ?DOB:[birth year], 46 y.o., female ?Today's Date: 02/24/2022 ? ?PCP: Patient, No Pcp Per (Inactive) ?REFERRING PROVIDER: Izora Gala, MD ? ? End of Session - 02/24/22 1048   ? ? Visit Number 1   ? Number of Visits 17   ? Date for SLP Re-Evaluation 05/25/22   ? Authorization Type [employer]   ? SLP Start Time [phone]   ? SLP Stop Time  1011   ? SLP Time Calculation (min) 46 min   ? Activity Tolerance Patient tolerated treatment well   ? ?  ?  ? ?  ? ? ?Past Medical History:  ?Diagnosis Date  ? Anxiety   ? panic attack - not recent 2001ish  ? Arthritis   ? Asthma   ? Cancer [facility])   ? thyroid cancer 2016  ? Dizziness   ? Endometriosis   ? Fibroid   ? GERD (gastroesophageal reflux disease)   ? Headache(784.0)   ? History of multiple miscarriages   ? Hypothyroidism   ? Insomnia   ? Palpitations   ? PCOS (polycystic ovarian syndrome)   ? PONV (postoperative nausea and vomiting)   ? last 2- no vomiting-  Seizures- not with last 2.  ? Seizures ([place])   ? last seizure 04/2017  ? SVD (spontaneous vaginal delivery)   ? x 1  ? Vertigo   ? Vitamin D deficiency   ? ?Past Surgical History:  ?Procedure Laterality Date  ? BREAST REDUCTION SURGERY    ? BREAST SURGERY  1997  ? DILATION AND EVACUATION N/A 11/25/2013  ? Procedure: DILATATION AND EVACUATION;  Surgeon: Woodroe Mode, MD;  Location: [facility];  Service: Gynecology;  Laterality: N/A;  ? DILATION AND EVACUATION N/A 11/30/2013  ? Procedure: DILATATION AND EVACUATION;  Surgeon: Jonnie Kind, MD;  Location: [facility];  Service: Gynecology;  Laterality: N/A;  ? DILATION AND EVACUATION N/A 05/02/2014  ? Procedure: DILATATION AND EVACUATION;  Surgeon: Ena Dawley, MD;  Location: [facility];  Service: Gynecology;  Laterality: N/A;  ? GANGLION CYST EXCISION  1999  ? right hand  ? mirena    ? REDUCTION MAMMAPLASTY Bilateral   ? THYROIDECTOMY Bilateral 01/02/2015  ? Procedure: TOTAL  THYROIDECTOMY;  Surgeon: Melissa Montane, MD;  Location: [facility];  Service: ENT;  Laterality: Bilateral;  ? ?Patient Active Problem List  ? Diagnosis Date Noted  ? FHx: colon cancer 09/19/2019  ? Gastroesophageal reflux disease 09/19/2019  ? Dysphagia 09/19/2019  ? Change in bowel habit 09/19/2019  ? Anemia 09/19/2019  ? Throat clearing 09/19/2019  ? Hypothyroidism 01/07/2018  ? Rupture of membranes with delay of delivery 01/05/2018  ? Obesity affecting pregnancy, antepartum, third trimester 01/05/2018  ? SVD (spontaneous vaginal delivery) 01/05/2018  ? Preterm labor in third trimester 11/17/2017  ? Asthma affecting pregnancy, antepartum 08/03/2017  ? SOB (shortness of breath) 08/03/2017  ? Feeling of chest tightness 08/03/2017  ? Fibroids 06/03/2017  ? AMA (advanced maternal age) multigravida 35+ 06/03/2017  ? 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  ? Morbid obesity with BMI of 45.0-49.9, adult ([place]) 06/03/2017  ? Palpitations 11/09/2015  ? Thyroid mass 01/02/2015  ? GOITER, UNSPECIFIED 07/20/2010  ? MIGRAINE HEADACHE 07/20/2010  ? SEIZURE DISORDER, HX OF 07/20/2010  ? ? ?Onset date: January 12, 2022 ? ?REFERRING DIAG: R49.0 (ICD-10-CM) - Dysphonia ? ?THERAPY DIAG:   ?Dysphonia ? ?Dysphasia ? ?SUBJECTIVE:  ? ?SUBJECTIVE STATEMENT: ?"I'm a little  nervous, I don't know what to expect." ?Pt accompanied by: self ? ?PERTINENT HISTORY: Pt has thyroid problems for past 15 years. She begin losing voice since having thyroidectomy in 2016 3-5x per year. Had COVID Jan 12, 2022 and pt has lost her voice since then. Pt reports to attempting vocal rest with voice lost but has toddler which makes it hard to completely negate voice. Pt's husband is deaf with cochlear and has to project her voice to speak with him. Pt is unable to work right now, does not talk until picking daughter up from school.  ? ?PAIN:  ?Are you having pain? Yes: NPRS scale: 7/10 ?Pain location: throat ?Pain description: sharp ?Aggravating factors: only  when speaking ?Relieving factors: staying quiet ? ? ?FALLS: Has patient fallen in last 6 months? No, Number of falls: 0 ? ?LIVING ENVIRONMENT: ?Lives with: lives with their family ?Lives in: House/apartment ? ?PLOF: Independent ? ?PATIENT GOALS "to get my voice back" ? ?OBJECTIVE:  ? ?DIAGNOSTIC FINDINGS: 02/14/2022 Flexible fiberoptic laryngoscopy: "The posterior soft palate, uvula, tongue base and vallecula were visualized and appeared healthy without mucosal masses or lesions. The epiglottis, aryepiglottic folds, hypopharynx, supraglottis, glottis were visualized and appeared healthy without mucosal masses or lesions. Vocal fold mobility was intact and symmetric.  ? ?Additional findings: She has significant ventricular phonation." ? ?COGNITION: ?Overall cognitive status: Within functional limits for tasks assessed ? ?SOCIAL HISTORY: ?Occupation: case Freight forwarder for [employer] ?Water intake: optimal ?Caffeine/alcohol intake: minimal (x1 cup of coffee a day) ?Daily voice use: minimal (only talking to daughter after  ?4pm; prior to condition, heavy voice user) ?Other: reports to  "gagging, throwing up" at least once a  ?day. Reported hx of esophageal stretching, acid  ?reflux. Has seen PCP and GI for this issue but does not feel it is well managed.  ? ?PERCEPTUAL VOICE ASSESSMENT: ?Voice quality: hoarse, breathy, rough, low vocal intensity, vocal fatigue, and aphonic ?Vocal abuse: habitual throat clearing ?Resonance: normal ?Respiratory function: thoracic breathing ?O ?OBJECTIVE VOICE ASSESSMENT: ?Maximum phonation time for sustained "ah": 6 seconds (average for adults is 15-25 cseconds)  ?Conversational pitch average: 264 Hz ?Conversational pitch range: 189 to 318 Hz ?Conversational loudness average: 54 dB ?Conversational loudness range: unreadable to 57 dB ?S/z ratio: .77 (Suggestive of dysfunction >1.0) /z/ production noted to be intermittent, with periods of aphonia.  ? ?PATIENT REPORTED OUTCOME MEASURES (PROM): ?V-RQOL:  5 (>80 indicates WNL) ? ?TODAY'S TREATMENT:  ?Eduction provided to pt on evaluation results, ventricular phonation vs. typical voicing, ST recommendations, vocal hygiene. Demonstrated circumlaryngeal massage, pt able to teach back with min A. Provided pt with opportunity to ask any questions re: voice therapy, all questions answered at this time. ? ?PATIENT EDUCATION: ?Education details: see above ?Person educated: Patient ?Education method: Explanation, Demonstration, Tactile cues, Verbal cues, and Handouts ?Education comprehension: verbalized understanding, returned demonstration, and needs further education ? ? ?HOME EXERCISE PROGRAM: ?Circumlaryngeal massage 3x/day ? ?ASSESSMENT: ? ?CLINICAL IMPRESSION: ?Patient is a 46 y.o. F who was seen today for dysphonia 2/2 ventricular phonation. Pt presents with voice notable for having significantly reduced intensity, with persistent harsh quality. Pt demonstrating vocal fatigue with majority of conversational speech. Some periods of aphonia observed throughout today's session. Upon gentle palpation, ST notes muscle tension present around laryngeal area and pt reports some pain. Chronic throat clearing observed (28+) throughout 45 minute session. Dysphonia is significantly impeding pt's ability to communicate with family members and perform essential vocational functions. Overall intelligibility is reduced d/t low vocal intensity and  hoarse, rough vocal quality. In addition to dysphonia, pt reports to difficulties swallowing, even with her own saliva. Has hx of GI issues which could be impacting dysphagia, however ST recommends clinical swallow evaluation and potentially MBSS to assess pt's swallow fx and safety.  ? ?OBJECTIVE IMPAIRMENTS include voice disorder and dysphagia. These impairments are limiting patient from effectively communicating at home and in community and safety when swallowing.  ?Patient will benefit from skilled SLP services to address above  impairments and improve overall function. ? ?REHAB POTENTIAL: Good ? ? ?GOALS: ?Goals reviewed with patient? Yes ? ?SHORT TERM GOALS: Target date: 03/24/2022 ? ?Pt will demonstrate increased awareness of throat clearing an

## 2022-02-25 DIAGNOSIS — M67431 Ganglion, right wrist: Secondary | ICD-10-CM | POA: Diagnosis not present

## 2022-02-25 DIAGNOSIS — D2339 Other benign neoplasm of skin of other parts of face: Secondary | ICD-10-CM | POA: Diagnosis not present

## 2022-03-01 ENCOUNTER — Other Ambulatory Visit: Payer: Self-pay

## 2022-03-01 ENCOUNTER — Ambulatory Visit: Payer: BC Managed Care – PPO | Admitting: Speech Pathology

## 2022-03-01 DIAGNOSIS — R490 Dysphonia: Secondary | ICD-10-CM

## 2022-03-01 DIAGNOSIS — R1310 Dysphagia, unspecified: Secondary | ICD-10-CM | POA: Diagnosis not present

## 2022-03-01 DIAGNOSIS — F331 Major depressive disorder, recurrent, moderate: Secondary | ICD-10-CM | POA: Diagnosis not present

## 2022-03-01 DIAGNOSIS — R4702 Dysphasia: Secondary | ICD-10-CM | POA: Diagnosis not present

## 2022-03-01 NOTE — Therapy (Signed)
?OUTPATIENT SPEECH LANGUAGE PATHOLOGY TREATMENT NOTE ? ? ?Patient Name: Sharon Benson ?MRN: [id number] ?DOB:28-Jul-1976, 46 y.o., female ?Today's Date: 03/01/2022 ? ?PCP: Patient, No Pcp Per (Inactive) ?REFERRING PROVIDER: Izora Gala, MD ? ? End of Session - 03/01/22 0936   ? ? Visit Number 2   ? Number of Visits 17   ? Date for SLP Re-Evaluation 05/25/22   ? Authorization Type BCBS   ? SLP Start Time 0930   ? SLP Stop Time  1009   ? SLP Time Calculation (min) 39 min   ? Activity Tolerance Patient tolerated treatment well   ? ?  ?  ? ?  ? ? ?Past Medical History:  ?Diagnosis Date  ? Anxiety   ? panic attack - not recent 2001ish  ? Arthritis   ? Asthma   ? Cancer [facility])   ? thyroid cancer 2016  ? Dizziness   ? Endometriosis   ? Fibroid   ? GERD (gastroesophageal reflux disease)   ? Headache(784.0)   ? History of multiple miscarriages   ? Hypothyroidism   ? Insomnia   ? Palpitations   ? PCOS (polycystic ovarian syndrome)   ? PONV (postoperative nausea and vomiting)   ? last 2- no vomiting-  Seizures- not with last 2.  ? Seizures ([place])   ? last seizure 04/2017  ? SVD (spontaneous vaginal delivery)   ? x 1  ? Vertigo   ? Vitamin D deficiency   ? ?Past Surgical History:  ?Procedure Laterality Date  ? BREAST REDUCTION SURGERY    ? BREAST SURGERY  1997  ? DILATION AND EVACUATION N/A 11/25/2013  ? Procedure: DILATATION AND EVACUATION;  Surgeon: Woodroe Mode, MD;  Location: [facility];  Service: Gynecology;  Laterality: N/A;  ? DILATION AND EVACUATION N/A 11/30/2013  ? Procedure: DILATATION AND EVACUATION;  Surgeon: Jonnie Kind, MD;  Location: [facility];  Service: Gynecology;  Laterality: N/A;  ? DILATION AND EVACUATION N/A 05/02/2014  ? Procedure: DILATATION AND EVACUATION;  Surgeon: Ena Dawley, MD;  Location: [facility];  Service: Gynecology;  Laterality: N/A;  ? GANGLION CYST EXCISION  1999  ? right hand  ? mirena    ? REDUCTION MAMMAPLASTY Bilateral   ? THYROIDECTOMY Bilateral 01/02/2015  ? Procedure: TOTAL  THYROIDECTOMY;  Surgeon: Melissa Montane, MD;  Location: [place];  Service: ENT;  Laterality: Bilateral;  ? ?Patient Active Problem List  ? Diagnosis Date Noted  ? FHx: colon cancer 09/19/2019  ? Gastroesophageal reflux disease 09/19/2019  ? Dysphagia 09/19/2019  ? Change in bowel habit 09/19/2019  ? Anemia 09/19/2019  ? Throat clearing 09/19/2019  ? Hypothyroidism 01/07/2018  ? Rupture of membranes with delay of delivery 01/05/2018  ? Obesity affecting pregnancy, antepartum, third trimester 01/05/2018  ? SVD (spontaneous vaginal delivery) 01/05/2018  ? Preterm labor in third trimester 11/17/2017  ? Asthma affecting pregnancy, antepartum 08/03/2017  ? SOB (shortness of breath) 08/03/2017  ? Feeling of chest tightness 08/03/2017  ? Fibroids 06/03/2017  ? AMA (advanced maternal age) multigravida 35+ 06/03/2017  ? 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  ? Morbid obesity with BMI of 45.0-49.9, adult ([place]) 06/03/2017  ? Palpitations 11/09/2015  ? Thyroid mass 01/02/2015  ? GOITER, UNSPECIFIED 07/20/2010  ? MIGRAINE HEADACHE 07/20/2010  ? SEIZURE DISORDER, HX OF 07/20/2010  ? ? ?ONSET DATE: January 2023 ? ?REFERRING DIAG: R49.0 (ICD-10-CM) - Dysphonia  ? ?THERAPY DIAG:  ?Dysphagia, unspecified type - Plan: SLP modified barium swallow ? ?Dysphonia ? ?SUBJECTIVE: "  I've noticed it's harder for me to not strain my voice with my 46 year old" ? ?PAIN:  ?Are you having pain? Yes ?NPRS scale: 4/10 ?Pain location: throat ?Pain orientation: Anterior  ?PAIN TYPE: aching ?Pain description: constant  ?Aggravating factors: talking ?Relieving factors: vocal rest ? ? ? ?OBJECTIVE:  ? ?TODAY'S TREATMENT: ?03/01/2022: Assessed pt's swallow through clinical swallow evaluation. Pt able to follow directions for oral mech exam, all appear within functional limits. Pt self-administers thin liquid via cup (to include sequential sips), puree, and solid. Pt reports pain in throat with swallow; throat clear during sequential sips but no coughing.  Vocal quality remains consistent throughout all PO boluses. Frequent throat clearing observed, however pt with this behavior at baseline. Pt c/o more difficulty clearing solid bolus, use of liquid wash helps clear sensation of bolus in throat. Discussed with pt that ST does not suspect pt is aspirating prandially; likely is esophageal symptoms contributing to pt's swallowing difficulties. Encouraged pt to f/u with GI. Since pt does c/o sensation of solids "sticking" in throat and frequent throat clears during PO intake, recommend objective swallow evaluation. Pt agreeable to MBSS, declines FEES d/t anxiety with procedure.  ? ?ST provides pt with education on diaphragmatic breathing and SOVTE exercises. Given usual min-mod A, pt able to complete structured exercises of each to decrease muscular tension leading to pain with voicing. Updated pt's HEP.  ? ?TODAY'S TREATMENT:  ?Eduction provided to pt on evaluation results, ventricular phonation vs. typical voicing, ST recommendations, vocal hygiene. Demonstrated circumlaryngeal massage, pt able to teach back with min A. Provided pt with opportunity to ask any questions re: voice therapy, all questions answered at this time. ?  ?PATIENT EDUCATION: ?Education details: see above ?Person educated: Patient ?Education method: Explanation, Demonstration, Tactile cues, Verbal cues, and Handouts ?Education comprehension: verbalized understanding, returned demonstration, and needs further education ?  ?  ?HOME EXERCISE PROGRAM: ?Circumlaryngeal massage 3x/day ?  ?ASSESSMENT: ?  ?CLINICAL IMPRESSION: ?Patient is a 46 y.o. F who was seen today for dysphonia 2/2 ventricular phonation. Pt presents with voice notable for having significantly reduced intensity, with persistent harsh quality. Pt demonstrating vocal fatigue with majority of conversational speech. Some periods of aphonia observed throughout today's session. Upon gentle palpation, ST notes muscle tension present around  laryngeal area and pt reports some pain. Chronic throat clearing observed (28+) throughout 45 minute session. Dysphonia is significantly impeding pt's ability to communicate with family members and perform essential vocational functions. Overall intelligibility is reduced d/t low vocal intensity and hoarse, rough vocal quality. In addition to dysphonia, pt reports to difficulties swallowing, even with her own saliva. Has hx of GI issues which could be impacting dysphagia, however ST recommends clinical swallow evaluation and potentially MBSS to assess pt's swallow fx and safety.  ?  ?OBJECTIVE IMPAIRMENTS include voice disorder and dysphagia. These impairments are limiting patient from effectively communicating at home and in community and safety when swallowing.  ?Patient will benefit from skilled SLP services to address above impairments and improve overall function. ?  ?REHAB POTENTIAL: Good ?  ?  ?GOALS: ?Goals reviewed with patient? Yes ?  ?SHORT TERM GOALS: Target date: 03/24/2022 ?  ?Pt will demonstrate increased awareness of throat clearing and use appropriate alternative in 80% of opportunities across 2 sessions.  ?Baseline: n/a ?Goal status: ONGOING ?  ?2.  Pt will utilize abdominal breathing in 18/20 sentences given occasional min A over 2 sessions ?Baseline: n/a ?Goal status: ONGOING ?  ?3.  Pt will  report completion of voice HEP with occasional min A over 2 sessions.  ?Baseline: n/a ?Goal status: ONGOING ?  ?4.  Pt will subjectively report decreased pain when speaking (currently 7/10) during structured speech tasks with occasional mod-A over 2 sessions.  ?Baseline: n/a ?Goal status: ONGOING ?  ?5.  Pt will complete clinical swallow evaluation to better appreciate pt's swallow fx and safety within first 2 sessions. ?Baseline: 03-01-22 ?Goal status: GOAL MET ?  ?  ?LONG TERM GOALS: Target date: 04/21/2022 ?  ?Pt will demonstrate no more than 8 throat clears over 45 minute session with rare min A for use of  alterative strategy over 1 week period.  ?Baseline: n/a ?Goal status: ONGOING ?  ?2.  Pt will utilize abdominal breathing in 10 minute  conversation given rare min A over 2 sessions ?Baseline: n/a ?Goal status: ONGOI

## 2022-03-01 NOTE — Patient Instructions (Addendum)
Diaphragmatic Breathing ?_____ Basic Breathing Pattern ?Place a hand on your chest, and one around your belly button. Take a slow, 4 count breath in through your nose, relaxing your abdominal muscles.  Your bottom hand should move out slightly. Exhale, slow and steady, through pursed lips, feeling your abdominal muscles now tighten to push out all of the air. Do not hold your breath in between repetitions. ?Do this 5 times. ? ?_____ Sharon Benson exhale ?Place a hand on your chest, and one around your belly button. Take a slow, 4 count breath in through your nose, relaxing your abdominal muscles.  Your bottom hand should move out slightly. Exhale through pursed lips with 3 bursts of air, your abdominal muscles should ?jump? to push out all of your air. Immediately take your next breath in, do not hold your breath at any time. ?Do this 5 times. ? ?Breath Plus Voice ? ?_____ Easy Onset Voicing ?Exhale with diaphragmatic support with pursed lips, slowly introduce the voice, making a ?whoo? sound.  The voice should not be full, but breathy and relaxed. Use more air than you would for normal speech. Use with multiple vowels: ?Breath+ah ?Breath+ee ?Breath+oh ?Breath+oo ?Do this 5 times. ? ?Semi-occluded vocal tract exercises (SOVTE) ? ?These allow your vocal folds to vibrate without excess tension and promotes high placement of the voice ? ?Use SOVTE as a warm up before prolonged speaking and vocal exercises ? ?Medium resistance: voicing through a drinking straw ? ?Watch Vocal Straw Exercises with Lolita Cram on YouTube: [email or web address] ? ?Pitch Glides for 2 minutes ?*try low, middle, high notes ? ?Hum the [employer] ? ?A goal would be 2-3 minutes several times a day and prior to vocal exercises ? ?As always, use good belly breathing while completing SOVTE  ?

## 2022-03-03 ENCOUNTER — Telehealth (HOSPITAL_COMMUNITY): Payer: Self-pay

## 2022-03-03 ENCOUNTER — Other Ambulatory Visit: Payer: Self-pay

## 2022-03-03 ENCOUNTER — Ambulatory Visit: Payer: BC Managed Care – PPO | Admitting: Speech Pathology

## 2022-03-03 DIAGNOSIS — R490 Dysphonia: Secondary | ICD-10-CM | POA: Diagnosis not present

## 2022-03-03 DIAGNOSIS — R4702 Dysphasia: Secondary | ICD-10-CM | POA: Diagnosis not present

## 2022-03-03 DIAGNOSIS — R1310 Dysphagia, unspecified: Secondary | ICD-10-CM | POA: Diagnosis not present

## 2022-03-03 NOTE — Therapy (Signed)
?OUTPATIENT SPEECH LANGUAGE PATHOLOGY TREATMENT NOTE ? ? ?Patient Name: Sharon Benson ?MRN: [id number] ?DOB:1976-05-02, 46 y.o., female ?Today's Date: 03/03/2022 ? ?PCP: Patient, No Pcp Per (Inactive) ?REFERRING PROVIDER: Izora Gala, MD ? ? End of Session - 03/03/22 1009   ? ? Visit Number 3   ? Number of Visits 17   ? Date for SLP Re-Evaluation 05/25/22   ? Authorization Type BCBS   ? SLP Start Time 0930   ? SLP Stop Time  1009   ? SLP Time Calculation (min) 39 min   ? Activity Tolerance Patient tolerated treatment well   ? ?  ?  ? ?  ? ? ? ?Past Medical History:  ?Diagnosis Date  ? Anxiety   ? panic attack - not recent 2001ish  ? Arthritis   ? Asthma   ? Cancer [facility])   ? thyroid cancer 2016  ? Dizziness   ? Endometriosis   ? Fibroid   ? GERD (gastroesophageal reflux disease)   ? Headache(784.0)   ? History of multiple miscarriages   ? Hypothyroidism   ? Insomnia   ? Palpitations   ? PCOS (polycystic ovarian syndrome)   ? PONV (postoperative nausea and vomiting)   ? last 2- no vomiting-  Seizures- not with last 2.  ? Seizures ([place])   ? last seizure 04/2017  ? SVD (spontaneous vaginal delivery)   ? x 1  ? Vertigo   ? Vitamin D deficiency   ? ?Past Surgical History:  ?Procedure Laterality Date  ? BREAST REDUCTION SURGERY    ? BREAST SURGERY  1997  ? DILATION AND EVACUATION N/A 11/25/2013  ? Procedure: DILATATION AND EVACUATION;  Surgeon: Woodroe Mode, MD;  Location: [facility];  Service: Gynecology;  Laterality: N/A;  ? DILATION AND EVACUATION N/A 11/30/2013  ? Procedure: DILATATION AND EVACUATION;  Surgeon: Jonnie Kind, MD;  Location: [facility];  Service: Gynecology;  Laterality: N/A;  ? DILATION AND EVACUATION N/A 05/02/2014  ? Procedure: DILATATION AND EVACUATION;  Surgeon: Ena Dawley, MD;  Location: [facility];  Service: Gynecology;  Laterality: N/A;  ? GANGLION CYST EXCISION  1999  ? right hand  ? mirena    ? REDUCTION MAMMAPLASTY Bilateral   ? THYROIDECTOMY Bilateral 01/02/2015  ? Procedure: TOTAL  THYROIDECTOMY;  Surgeon: Melissa Montane, MD;  Location: [place];  Service: ENT;  Laterality: Bilateral;  ? ?Patient Active Problem List  ? Diagnosis Date Noted  ? FHx: colon cancer 09/19/2019  ? Gastroesophageal reflux disease 09/19/2019  ? Dysphagia 09/19/2019  ? Change in bowel habit 09/19/2019  ? Anemia 09/19/2019  ? Throat clearing 09/19/2019  ? Hypothyroidism 01/07/2018  ? Rupture of membranes with delay of delivery 01/05/2018  ? Obesity affecting pregnancy, antepartum, third trimester 01/05/2018  ? SVD (spontaneous vaginal delivery) 01/05/2018  ? Preterm labor in third trimester 11/17/2017  ? Asthma affecting pregnancy, antepartum 08/03/2017  ? SOB (shortness of breath) 08/03/2017  ? Feeling of chest tightness 08/03/2017  ? Fibroids 06/03/2017  ? AMA (advanced maternal age) multigravida 35+ 06/03/2017  ? 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  ? Morbid obesity with BMI of 45.0-49.9, adult ([place]) 06/03/2017  ? Palpitations 11/09/2015  ? Thyroid mass 01/02/2015  ? GOITER, UNSPECIFIED 07/20/2010  ? MIGRAINE HEADACHE 07/20/2010  ? SEIZURE DISORDER, HX OF 07/20/2010  ? ? ?ONSET DATE: January 2023 ? ?REFERRING DIAG: R49.0 (ICD-10-CM) - Dysphonia  ? ?THERAPY DIAG:  ?Dysphonia ? ?SUBJECTIVE: "I was doing the straw exercises, low ranges spot  on." Tells ST she is still unable to voice with higher pitches. Reports most pain with speaking to husband as he is deaf and has CI- requires pt to project.  ? ?PAIN:  ?Are you having pain? Yes ?NPRS scale: 7/10 ?Pain location: throat ?Pain orientation: Anterior  ?PAIN TYPE: aching ?Pain description: constant  ?Aggravating factors: talking ?Relieving factors: vocal rest ? ? ? ?OBJECTIVE:  ? ?TODAY'S TREATMENT: ?03/03/2022: Target vocal hygiene in regards to conversation with husband. Assisted pt in scripting self-advocacy script to limit projecting voice and set expectation for conversations to protest pt's voice. Introduced and demonstrated flow phonation. Pt requires usual mod  A to complete exercises through voicing and articulating syllables. Unable to progress to phrases this date d/t pt demonstrating tension and hoarseness with longer utterances. Updated HEP.  ? ?03/01/2022: Assessed pt's swallow through clinical swallow evaluation. Pt able to follow directions for oral mech exam, all appear within functional limits. Pt self-administers thin liquid via cup (to include sequential sips), puree, and solid. Pt reports pain in throat with swallow; throat clear during sequential sips but no coughing. Vocal quality remains consistent throughout all PO boluses. Frequent throat clearing observed, however pt with this behavior at baseline. Pt c/o more difficulty clearing solid bolus, use of liquid wash helps clear sensation of bolus in throat. Discussed with pt that ST does not suspect pt is aspirating prandially; likely is esophageal symptoms contributing to pt's swallowing difficulties. Encouraged pt to f/u with GI. Since pt does c/o sensation of solids "sticking" in throat and frequent throat clears during PO intake, recommend objective swallow evaluation. Pt agreeable to MBSS, declines FEES d/t anxiety with procedure.  ? ?ST provides pt with education on diaphragmatic breathing and SOVTE exercises. Given usual min-mod A, pt able to complete structured exercises of each to decrease muscular tension leading to pain with voicing. Updated pt's HEP.  ?  ?PATIENT EDUCATION: ?Education details: see above ?Person educated: Patient ?Education method: Explanation, Demonstration, Tactile cues, Verbal cues, and Handouts ?Education comprehension: verbalized understanding, returned demonstration, and needs further education ?  ?  ?HOME EXERCISE PROGRAM: ?Circumlaryngeal massage 3x/day ?  ?ASSESSMENT: ?  ?CLINICAL IMPRESSION: ?Patient is a 46 y.o. F who was seen today for dysphonia 2/2 ventricular phonation. Pt presents with voice notable for having significantly reduced intensity, with persistent harsh  quality. Pt demonstrating vocal fatigue with majority of conversational speech. Some periods of aphonia observed throughout today's session. Upon gentle palpation, ST notes muscle tension present around laryngeal area and pt reports some pain. Chronic throat clearing observed (28+) throughout 45 minute session. Dysphonia is significantly impeding pt's ability to communicate with family members and perform essential vocational functions. Overall intelligibility is reduced d/t low vocal intensity and hoarse, rough vocal quality. In addition to dysphonia, pt reports to difficulties swallowing, even with her own saliva. Has hx of GI issues which could be impacting dysphagia, however ST recommends clinical swallow evaluation and potentially MBSS to assess pt's swallow fx and safety.  ?  ?OBJECTIVE IMPAIRMENTS include voice disorder and dysphagia. These impairments are limiting patient from effectively communicating at home and in community and safety when swallowing.  ?Patient will benefit from skilled SLP services to address above impairments and improve overall function. ?  ?REHAB POTENTIAL: Good ?  ?  ?GOALS: ?Goals reviewed with patient? Yes ?  ?SHORT TERM GOALS: Target date: 03/24/2022 ?  ?Pt will demonstrate increased awareness of throat clearing and use appropriate alternative in 80% of opportunities across 2 sessions.  ?Baseline:  n/a ?Goal status: ONGOING ?  ?2.  Pt will utilize abdominal breathing in 18/20 sentences given occasional min A over 2 sessions ?Baseline: n/a ?Goal status: ONGOING ?  ?3.  Pt will report completion of voice HEP with occasional min A over 2 sessions.  ?Baseline: n/a ?Goal status: ONGOING ?  ?4.  Pt will subjectively report decreased pain when speaking (currently 7/10) during structured speech tasks with occasional mod-A over 2 sessions.  ?Baseline: n/a ?Goal status: ONGOING ?  ?5.  Pt will complete clinical swallow evaluation to better appreciate pt's swallow fx and safety within first 2  sessions. ?Baseline: 03-01-22 ?Goal status: GOAL MET ?  ?  ?LONG TERM GOALS: Target date: 04/21/2022 ?  ?Pt will demonstrate no more than 8 throat clears over 45 minute session with rare min A for use of alterative

## 2022-03-03 NOTE — Telephone Encounter (Signed)
Attempted to contact patient to schedule OP MBS - left voicemail. ?

## 2022-03-03 NOTE — Patient Instructions (Signed)
Continue laryngeal massage 2x a day.  ? ?Semi Occluded Vocal Tract humming to warm up. Try and do the pitch variations but if it gets uncomfortable or you notice excess tension, don't go to that pitch.  ? ?Gargle with water, try to keep voice on! No breaks is your goal.  ? ?After you complete the semi-occluded vocal tract exercises (straw exercises & gargling), complete the following twice a day ? ?Use a tissue to focus on airflow: ? ?Whooo 10x ?Shoe 10x ?Collie Siad 10x ? ?Shoe-Fu ?Sue-Pu ?He- She ?Fu-Sue ?Pu-Lu ? ?Who are you? ? ?Who is Collie Siad? ? ?Twenty ?Thirty  ?Forty ?Fifty ?Sixty ?Seventy ?Eighty ?Ninety ?Hundred ? ?

## 2022-03-08 ENCOUNTER — Encounter: Payer: BC Managed Care – PPO | Admitting: Speech Pathology

## 2022-03-08 DIAGNOSIS — F331 Major depressive disorder, recurrent, moderate: Secondary | ICD-10-CM | POA: Diagnosis not present

## 2022-03-10 ENCOUNTER — Ambulatory Visit: Payer: BC Managed Care – PPO | Admitting: Speech Pathology

## 2022-03-10 ENCOUNTER — Other Ambulatory Visit (HOSPITAL_COMMUNITY): Payer: Self-pay

## 2022-03-10 DIAGNOSIS — R1310 Dysphagia, unspecified: Secondary | ICD-10-CM

## 2022-03-11 ENCOUNTER — Ambulatory Visit (HOSPITAL_COMMUNITY)
Admission: RE | Admit: 2022-03-11 | Discharge: 2022-03-11 | Disposition: A | Discharge status: Home / Self Care | Payer: BC Managed Care – PPO | Source: Ambulatory Visit | Attending: Otolaryngology | Admitting: Otolaryngology

## 2022-03-11 DIAGNOSIS — R1310 Dysphagia, unspecified: Secondary | ICD-10-CM | POA: Insufficient documentation

## 2022-03-11 NOTE — Progress Notes (Signed)
Modified Barium Swallow Progress Note ? ?Patient Details  ?Name: Sharon Benson ?MRN: [id number] ?Date of Birth: 06/14/1976 ? ?Today's Date: 03/11/2022 ? ?Modified Barium Swallow completed.  Full report located under Chart Review in the Imaging Section. ? ?Brief recommendations include the following: ? ?Clinical Impression ? Pt presents with functional swallow, although esophageal component questioned. Swallow initation of POs noted at vallecula in majority of trials and adequate pharyngeal stripping wave resulted in no notable pharyngeal residuals. Flash penetration of thin liquids observed with consistent full ejection of material due to strength of laryngeal vestibule closure (PAS 2). No aspiration observed. With 63m barium pill with thin, pt spontaneously throat cleared to assist in clearing thin liquid penetrate above vocal folds, but again, no aspiration or penetration to cords observed. Suspect some reduced relaxation of the cricopharyngeus, only trace resdiuals observed at CP segment, cleared with subsequent swallows. Recommend continue regular/thin liquid diet with adherence to universal swallow/reflux precautions. F/u with GI is also recommended for further assessment.  Educated pt regarding results and recommendations this date. Defer further treatment/education in relation to swallow function to OP SLP. ?  ?Swallow Evaluation Recommendations ? ? Recommended Consults: Consider GI evaluation ? ? SLP Diet Recommendations: Regular solids;Thin liquid ? ? Liquid Administration via: Cup;Straw ? ? Medication Administration: Whole meds with liquid ? ? Supervision: Patient able to self feed ? ? Compensations: Slow rate;Small sips/bites;Clear throat intermittently ? ? Postural Changes: Seated upright at 90 degrees ? ? Oral Care Recommendations: Oral care BID ? ?   ? ? ? ? ?IEllwood Dense MA, CCC-SLP ?Acute Rehabilitation Services ?Office Number: [phone]? ?IAcie Fredrickson?03/11/2022,12:18 PM ? ?

## 2022-03-15 ENCOUNTER — Ambulatory Visit: Payer: BC Managed Care – PPO | Attending: Otolaryngology | Admitting: Speech Pathology

## 2022-03-15 DIAGNOSIS — F331 Major depressive disorder, recurrent, moderate: Secondary | ICD-10-CM | POA: Diagnosis not present

## 2022-03-15 DIAGNOSIS — R490 Dysphonia: Secondary | ICD-10-CM | POA: Insufficient documentation

## 2022-03-17 ENCOUNTER — Ambulatory Visit: Payer: BC Managed Care – PPO | Admitting: Speech Pathology

## 2022-03-17 DIAGNOSIS — E669 Obesity, unspecified: Secondary | ICD-10-CM | POA: Diagnosis not present

## 2022-03-17 DIAGNOSIS — R5381 Other malaise: Secondary | ICD-10-CM | POA: Diagnosis not present

## 2022-03-17 DIAGNOSIS — E039 Hypothyroidism, unspecified: Secondary | ICD-10-CM | POA: Diagnosis not present

## 2022-03-17 DIAGNOSIS — E559 Vitamin D deficiency, unspecified: Secondary | ICD-10-CM | POA: Diagnosis not present

## 2022-03-17 DIAGNOSIS — R490 Dysphonia: Secondary | ICD-10-CM

## 2022-03-17 DIAGNOSIS — N926 Irregular menstruation, unspecified: Secondary | ICD-10-CM | POA: Diagnosis not present

## 2022-03-17 DIAGNOSIS — R5383 Other fatigue: Secondary | ICD-10-CM | POA: Diagnosis not present

## 2022-03-17 DIAGNOSIS — Z131 Encounter for screening for diabetes mellitus: Secondary | ICD-10-CM | POA: Diagnosis not present

## 2022-03-17 NOTE — Therapy (Signed)
?OUTPATIENT SPEECH LANGUAGE PATHOLOGY TREATMENT NOTE ? ? ?Patient Name: Sharon Benson ?MRN: [id number] ?DOB:1976-11-24, 46 y.o., female ?Today's Date: 03/17/2022 ? ?PCP: Patient, No Pcp Per (Inactive) ?REFERRING PROVIDER: Izora Gala, MD ? ? End of Session - 03/17/22 4098   ? ? Visit Number 4   ? Number of Visits 17   ? Date for SLP Re-Evaluation 05/25/22   ? Authorization Type BCBS   ? SLP Start Time 0930   ? SLP Stop Time  1010   ? SLP Time Calculation (min) 40 min   ? Activity Tolerance Patient tolerated treatment well   ? ?  ?  ? ?  ? ? ? ?Past Medical History:  ?Diagnosis Date  ? Anxiety   ? panic attack - not recent 2001ish  ? Arthritis   ? Asthma   ? Cancer [facility])   ? thyroid cancer 2016  ? Dizziness   ? Endometriosis   ? Fibroid   ? GERD (gastroesophageal reflux disease)   ? Headache(784.0)   ? History of multiple miscarriages   ? Hypothyroidism   ? Insomnia   ? Palpitations   ? PCOS (polycystic ovarian syndrome)   ? PONV (postoperative nausea and vomiting)   ? last 2- no vomiting-  Seizures- not with last 2.  ? Seizures ([place])   ? last seizure 04/2017  ? SVD (spontaneous vaginal delivery)   ? x 1  ? Vertigo   ? Vitamin D deficiency   ? ?Past Surgical History:  ?Procedure Laterality Date  ? BREAST REDUCTION SURGERY    ? BREAST SURGERY  1997  ? DILATION AND EVACUATION N/A 11/25/2013  ? Procedure: DILATATION AND EVACUATION;  Surgeon: Woodroe Mode, MD;  Location: [facility];  Service: Gynecology;  Laterality: N/A;  ? DILATION AND EVACUATION N/A 11/30/2013  ? Procedure: DILATATION AND EVACUATION;  Surgeon: Jonnie Kind, MD;  Location: [facility];  Service: Gynecology;  Laterality: N/A;  ? DILATION AND EVACUATION N/A 05/02/2014  ? Procedure: DILATATION AND EVACUATION;  Surgeon: Ena Dawley, MD;  Location: [facility];  Service: Gynecology;  Laterality: N/A;  ? GANGLION CYST EXCISION  1999  ? right hand  ? mirena    ? REDUCTION MAMMAPLASTY Bilateral   ? THYROIDECTOMY Bilateral 01/02/2015  ? Procedure: TOTAL  THYROIDECTOMY;  Surgeon: Melissa Montane, MD;  Location: [facility];  Service: ENT;  Laterality: Bilateral;  ? ?Patient Active Problem List  ? Diagnosis Date Noted  ? FHx: colon cancer 09/19/2019  ? Gastroesophageal reflux disease 09/19/2019  ? Dysphagia 09/19/2019  ? Change in bowel habit 09/19/2019  ? Anemia 09/19/2019  ? Throat clearing 09/19/2019  ? Hypothyroidism 01/07/2018  ? Rupture of membranes with delay of delivery 01/05/2018  ? Obesity affecting pregnancy, antepartum, third trimester 01/05/2018  ? SVD (spontaneous vaginal delivery) 01/05/2018  ? Preterm labor in third trimester 11/17/2017  ? Asthma affecting pregnancy, antepartum 08/03/2017  ? SOB (shortness of breath) 08/03/2017  ? Feeling of chest tightness 08/03/2017  ? Fibroids 06/03/2017  ? AMA (advanced maternal age) multigravida 35+ 06/03/2017  ? 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  ? Morbid obesity with BMI of 45.0-49.9, adult ([place]) 06/03/2017  ? Palpitations 11/09/2015  ? Thyroid mass 01/02/2015  ? GOITER, UNSPECIFIED 07/20/2010  ? MIGRAINE HEADACHE 07/20/2010  ? SEIZURE DISORDER, HX OF 07/20/2010  ? ? ?ONSET DATE: January 2023 ? ?REFERRING DIAG: R49.0 (ICD-10-CM) - Dysphonia  ? ?THERAPY DIAG:  ?Dysphonia ? ?SPEECH THERAPY DISCHARGE SUMMARY ? ?Visits from Start of Care:  4 ? ?Current functional level related to goals / functional outcomes: ?Nola presents with significantly improved vocal quality and reports decreased pain with voicing. Improved vocal quality evidenced for greater than 1 week period. ST has trained pt in use of laryngeal massage to decrease muscle tension contributing to dysphonia, SOVT and flow phonation, and conversational training therapy techniques.  Pt with understanding of vocal hygiene measures and all HEP. Pt verbalizes she is pleased with her current voice quality. ?  ?Remaining deficits: ?Intermittent hoarseness in connected speech but able to correct using conversation strategy of "clear speech" to increase voice  acoustics. Pt concerned about potential decrease in quality as she increases usage returning to work. Provided pt with strategies to mitigate this decrease.   ?  ?Education / Equipment: ?Laryngeal massage, flow phonation, SOVT, conversational training therapy techniques, vocal hygiene, vocally abusive behaviors, compensations for personal life circumstances ? ?Patient agrees to discharge. Patient goals were partially met. Patient is being discharged due to the patient's request. Pt is returning to work next week and will be unable to attend therapy.. ? ?SUBJECTIVE: "Sorry about Tuesday, I thought it was Monday so I missed our appointment." ? ?PAIN:  ?Are you having pain? No ? ?OBJECTIVE:  ? ?TODAY'S TREATMENT: ?03/17/2022: Pt arrives with significantly improved vocal quality, only residual hoarseness. Reports to having improved voice since last week, Wednesday night. Attributes improved vocal hygiene, avoiding overuse, and HEP for improvement. Pt plans to return to work next week. As such, coached pt on using "clear speech," using conversation training therapy. Pt's employment requires much talking throughout day and conversational strategies will be most beneficial to alleviate return to dysphonia with increased voice usage. Given usual mod, faded to rare min-A for slowing rate and articulating end sounds, pt able to use clear speech during structured reading task and a more complex structured conversational task. Reviewed HEP for flow phonation, SOVT, laryngeal massage.  ? ?03/03/2022: Target vocal hygiene in regards to conversation with husband. Assisted pt in scripting self-advocacy script to limit projecting voice and set expectation for conversations to protest pt's voice. Introduced and demonstrated flow phonation. Pt requires usual mod A to complete exercises through voicing and articulating syllables. Unable to progress to phrases this date d/t pt demonstrating tension and hoarseness with longer utterances.  Updated HEP.  ? ?03/01/2022: Assessed pt's swallow through clinical swallow evaluation. Pt able to follow directions for oral mech exam, all appear within functional limits. Pt self-administers thin liquid via cup (to include sequential sips), puree, and solid. Pt reports pain in throat with swallow; throat clear during sequential sips but no coughing. Vocal quality remains consistent throughout all PO boluses. Frequent throat clearing observed, however pt with this behavior at baseline. Pt c/o more difficulty clearing solid bolus, use of liquid wash helps clear sensation of bolus in throat. Discussed with pt that ST does not suspect pt is aspirating prandially; likely is esophageal symptoms contributing to pt's swallowing difficulties. Encouraged pt to f/u with GI. Since pt does c/o sensation of solids "sticking" in throat and frequent throat clears during PO intake, recommend objective swallow evaluation. Pt agreeable to MBSS, declines FEES d/t anxiety with procedure.  ? ?ST provides pt with education on diaphragmatic breathing and SOVTE exercises. Given usual min-mod A, pt able to complete structured exercises of each to decrease muscular tension leading to pain with voicing. Updated pt's HEP.  ?  ?PATIENT EDUCATION: ?Education details: see above ?Person educated: Patient ?Education method: Explanation, Demonstration, Tactile cues, Verbal cues, and  Handouts ?Education comprehension: verbalized understanding, returned demonstration, and needs further education ?  ?  ?HOME EXERCISE PROGRAM: ?Circumlaryngeal massage 3x/day; flow exercises; SOVT; using clear speech ?  ?ASSESSMENT: ?  ?CLINICAL IMPRESSION: ?Patient being treated for dysphonia 2/2 ventricular phonation. Pt presents with significantly improved vocal quality and intensity. No instances of aphonia observed, adequate breath support utilized. Trained in conversational techniques of using "clear speech," pt able to use with rare min-A in connected speech. No  swallowing impairment observed on objective swallow study. Pt being d/c d/t pt request as she is returning to work and will be unable to return for tx.   ?  ?OBJECTIVE IMPAIRMENTS include voice disorder and dysphag

## 2022-03-17 NOTE — Patient Instructions (Signed)
Laryngeal Massage 2x day for 5 minutes ? ?Gurgling and humming exercises to warm up voice before work ? ?We want your voice "flowing" ? ? ?Use CLEAR SPEECH all the time! ?Slow down ?Say all sounds, especially those end sounds ?Reset pitch after a pause  ? ?Print out picture of husband, put speech bubble telling you to "SLOW DOWN" to remind you.  ? ?Vocal Hygiene: ?Drink plenty of water ?Avoid excess caffeine ?Avoid yelling ?Mitigate background noise, if able  ?Do not exert voice in loud areas  ?Face your listener when with them  ? ?Continue working to reduce throat clearing. Breathe in through nose, drink water.  ?

## 2022-03-22 ENCOUNTER — Ambulatory Visit: Payer: BC Managed Care – PPO | Admitting: Speech Pathology

## 2022-03-22 DIAGNOSIS — F331 Major depressive disorder, recurrent, moderate: Secondary | ICD-10-CM | POA: Diagnosis not present

## 2022-03-24 ENCOUNTER — Ambulatory Visit: Payer: BC Managed Care – PPO | Admitting: Speech Pathology

## 2022-03-29 ENCOUNTER — Encounter: Payer: BC Managed Care – PPO | Admitting: Speech Pathology

## 2022-03-31 ENCOUNTER — Encounter: Payer: BC Managed Care – PPO | Admitting: Speech Pathology

## 2022-04-05 ENCOUNTER — Encounter: Payer: BC Managed Care – PPO | Admitting: Speech Pathology

## 2022-04-07 ENCOUNTER — Encounter: Payer: BC Managed Care – PPO | Admitting: Speech Pathology

## 2022-04-14 DIAGNOSIS — L03114 Cellulitis of left upper limb: Secondary | ICD-10-CM | POA: Diagnosis not present

## 2022-04-21 ENCOUNTER — Telehealth: Payer: BC Managed Care – PPO | Admitting: Physician Assistant

## 2022-04-21 DIAGNOSIS — J310 Chronic rhinitis: Secondary | ICD-10-CM

## 2022-04-21 MED ORDER — LEVOCETIRIZINE DIHYDROCHLORIDE 5 MG PO TABS: 5.0000 mg | ORAL_TABLET | Freq: Every evening | ORAL | 1 refills | Status: DC

## 2022-04-21 MED ORDER — AMOXICILLIN-POT CLAVULANATE 875-125 MG PO TABS: 1.0000 | ORAL_TABLET | Freq: Two times a day (BID) | ORAL | 0 refills | Status: DC

## 2022-04-21 MED ORDER — FLUTICASONE PROPIONATE 50 MCG/ACT NA SUSP: 2.0000 | Freq: Every day | NASAL | 0 refills | Status: AC

## 2022-04-21 NOTE — Patient Instructions (Addendum)
?  [name], thank you for joining Leeanne Rio, PA-C for today's virtual visit.  While this provider is not your primary care provider (PCP), if your PCP is located in our provider database this encounter information will be shared with them immediately following your visit. ? ?Consent: ?(Patient) Sharon Benson provided verbal consent for this virtual visit at the beginning of the encounter. ? ?Current Medications: ? ?Current Outpatient Medications:  ?  albuterol (PROVENTIL HFA;VENTOLIN HFA) 108 (90 Base) MCG/ACT inhaler, Inhale 2 puffs into the lungs every 4 (four) hours as needed for wheezing or shortness of breath., Disp: 1 Inhaler, Rfl: 3 ?  benzonatate (TESSALON) 100 MG capsule, Take 1 capsule (100 mg total) by mouth every 8 (eight) hours. (Patient not taking: Reported on 02/24/2022), Disp: 21 capsule, Rfl: 0 ?  Cetirizine HCl (ZYRTEC ALLERGY) 10 MG CAPS, Take 1 capsule (10 mg total) by mouth daily. (Patient not taking: Reported on 11/24/2021), Disp: 30 capsule, Rfl: 0 ?  EPINEPHrine 0.3 mg/0.3 mL IJ SOAJ injection, Inject 0.3 mg into the muscle as needed for anaphylaxis., Disp: , Rfl:  ?  esomeprazole (NEXIUM) 40 MG capsule, Take 1 capsule (40 mg total) by mouth every evening. (Patient not taking: Reported on 08/18/2020), Disp: 30 capsule, Rfl: 2 ?  levonorgestrel (MIRENA) 20 MCG/24HR IUD, 1 each by Intrauterine route continuous., Disp: , Rfl:  ?  levothyroxine (SYNTHROID) 200 MCG tablet, Take 200 mcg by mouth daily before breakfast., Disp: , Rfl:  ?  Liraglutide -Weight Management (SAXENDA) 18 MG/3ML SOPN, Inject 1.8 mg into the skin daily., Disp: , Rfl:  ?  ondansetron (ZOFRAN-ODT) 4 MG disintegrating tablet, Take 1 tablet (4 mg total) by mouth every 8 (eight) hours as needed for nausea or vomiting., Disp: 20 tablet, Rfl: 0 ?  thyroid (ARMOUR) 60 MG tablet, Take 60 mg by mouth daily before breakfast., Disp: , Rfl:   ? ?Medications ordered in this encounter:  ?No orders of the defined  types were placed in this encounter. ?  ? ?*If you need refills on other medications prior to your next appointment, please contact your pharmacy* ? ?Follow-Up: ?Call back or seek an in-person evaluation if the symptoms worsen or if the condition fails to improve as anticipated. ? ?Other Instructions ?Please keep well-hydrated and get plenty of rest.  ?Continue the Montelukast (Singulair) as directed. ?Start a saline nasal rinse. Use the Flonase as directed. ?Take the Xyzal once daily as well. ?You can use Pataday drops OTC.  ?Please follow-up ? ? ? ?If you have been instructed to have an in-person evaluation today at a local Urgent Care facility, please use the link below. It will take you to a list of all of our available [facility], including address, phone number and hours of operation. Please do not delay care.  ?[facility] ? ?If you or a family member do not have a primary care provider, use the link below to schedule a visit and establish care. When you choose a [facility] primary care physician or advanced practice provider, you gain a long-term partner in health. ?Find a Primary Care Provider ? ?Learn more about [facility]'s in-office and virtual care options: ?[facility]  ?

## 2022-04-21 NOTE — Progress Notes (Signed)
?Virtual Visit Consent  ? ?[name], you are scheduled for a virtual visit with a [facility] provider today. Just as with appointments in the office, your consent must be obtained to participate. Your consent will be active for this visit and any virtual visit you may have with one of our providers in the next 365 days. If you have a MyChart account, a copy of this consent can be sent to you electronically. ? ?As this is a virtual visit, video technology does not allow for your provider to perform a traditional examination. This may limit your provider's ability to fully assess your condition. If your provider identifies any concerns that need to be evaluated in person or the need to arrange testing (such as labs, EKG, etc.), we will make arrangements to do so. Although advances in technology are sophisticated, we cannot ensure that it will always work on either your end or our end. If the connection with a video visit is poor, the visit may have to be switched to a telephone visit. With either a video or telephone visit, we are not always able to ensure that we have a secure connection. ? ?By engaging in this virtual visit, you consent to the provision of healthcare and authorize for your insurance to be billed (if applicable) for the services provided during this visit. Depending on your insurance coverage, you may receive a charge related to this service. ? ?I need to obtain your verbal consent now. Are you willing to proceed with your visit today? Amberley Hamler has provided verbal consent on 04/21/2022 for a virtual visit (video or telephone). Leeanne Rio, PA-C ? ?Date: 04/21/2022 10:27 AM ? ?Virtual Visit via Video Note  ? ?I, Leeanne Rio, connected with  Sharon Benson  ([id number], Apr 16, 1976) on 04/21/22 at 10:15 AM EDT by a video-enabled telemedicine application and verified that I am speaking with the correct person using two identifiers. ? ?Location: ?Patient: Virtual  Visit Location Patient: Home ?Provider: Virtual Visit Location Provider: Home Office ?  ?I discussed the limitations of evaluation and management by telemedicine and the availability of in person appointments. The patient expressed understanding and agreed to proceed.   ? ?History of Present Illness: ?Sharon Benson is a 46 y.o. who identifies as a female who was assigned female at birth, and is being seen today for persistent and significant allergy symptoms this season. She is followed by an allergist and is currently on a regimen of Singulair along with OTC antihistamines. Since Monday noting an increase in her head/sinus symptoms since the weekend/beginning of the week. Now noting increased nasal congestion, rhinorrhea, sneezing, PND with scratchy throat, itchy/watery eyes. Denies fever, chills. Now noting some maxillary sinus pain. Trying to use shower steam to help open her passages.  ? ?HPI: HPI  ?Problems:  ?Patient Active Problem List  ? Diagnosis Date Noted  ? FHx: colon cancer 09/19/2019  ? Gastroesophageal reflux disease 09/19/2019  ? Dysphagia 09/19/2019  ? Change in bowel habit 09/19/2019  ? Anemia 09/19/2019  ? Throat clearing 09/19/2019  ? Hypothyroidism 01/07/2018  ? Rupture of membranes with delay of delivery 01/05/2018  ? Obesity affecting pregnancy, antepartum, third trimester 01/05/2018  ? SVD (spontaneous vaginal delivery) 01/05/2018  ? Preterm labor in third trimester 11/17/2017  ? Asthma affecting pregnancy, antepartum 08/03/2017  ? SOB (shortness of breath) 08/03/2017  ? Feeling of chest tightness 08/03/2017  ? Fibroids 06/03/2017  ? AMA (advanced maternal age) multigravida 35+ 06/03/2017  ?  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  ? Morbid obesity with BMI of 45.0-49.9, adult ([place]) 06/03/2017  ? Palpitations 11/09/2015  ? Thyroid mass 01/02/2015  ? GOITER, UNSPECIFIED 07/20/2010  ? MIGRAINE HEADACHE 07/20/2010  ? SEIZURE DISORDER, HX OF 07/20/2010  ?  ?Allergies:  ?Allergies   ?Allergen Reactions  ? Other Anaphylaxis and Itching  ?  Cantaloupe ? ?Makes her tongue swell  ? Cocoa Butter Hives  ? Codeine Other (See Comments)  ?  seizures  ? ?Medications:  ?Current Outpatient Medications:  ?  amoxicillin-clavulanate (AUGMENTIN) 875-125 MG tablet, Take 1 tablet by mouth 2 (two) times daily., Disp: 14 tablet, Rfl: 0 ?  fluticasone (FLONASE) 50 MCG/ACT nasal spray, Place 2 sprays into both nostrils daily., Disp: 16 g, Rfl: 0 ?  levocetirizine (XYZAL) 5 MG tablet, Take 1 tablet (5 mg total) by mouth every evening., Disp: 30 tablet, Rfl: 1 ?  albuterol (PROVENTIL HFA;VENTOLIN HFA) 108 (90 Base) MCG/ACT inhaler, Inhale 2 puffs into the lungs every 4 (four) hours as needed for wheezing or shortness of breath., Disp: 1 Inhaler, Rfl: 3 ?  EPINEPHrine 0.3 mg/0.3 mL IJ SOAJ injection, Inject 0.3 mg into the muscle as needed for anaphylaxis., Disp: , Rfl:  ?  levonorgestrel (MIRENA) 20 MCG/24HR IUD, 1 each by Intrauterine route continuous., Disp: , Rfl:  ?  levothyroxine (SYNTHROID) 200 MCG tablet, Take 200 mcg by mouth daily before breakfast., Disp: , Rfl:  ?  Liraglutide -Weight Management (SAXENDA) 18 MG/3ML SOPN, Inject 1.8 mg into the skin daily., Disp: , Rfl:  ?  montelukast (SINGULAIR) 10 MG tablet, Take 10 mg by mouth daily., Disp: , Rfl:  ?  thyroid (ARMOUR) 60 MG tablet, Take 60 mg by mouth daily before breakfast., Disp: , Rfl:  ? ?Observations/Objective: ?Patient is well-developed, well-nourished in no acute distress.  ?Resting comfortably at home.  ?Head is normocephalic, atraumatic.  ?No labored breathing. ?Speech is clear and coherent with logical content.  ?Patient is alert and oriented at baseline.  ? ?Assessment and Plan: ?1. Chronic rhinitis ?- levocetirizine (XYZAL) 5 MG tablet; Take 1 tablet (5 mg total) by mouth every evening.  Dispense: 30 tablet; Refill: 1 ?- fluticasone (FLONASE) 50 MCG/ACT nasal spray; Place 2 sprays into both nostrils daily.  Dispense: 16 g; Refill: 0 ? ?Flare  up of chronic rhinitis after being outside more often and doing some deep cleaning. Will have her continue her Singulair daily. Increase fluids. Start saline rinses. Flonase and Xyzal daily per orders. Discussed risk of developing subsequent sinus infection giving some maxillary sinus pain she is having. She is to follow the aforementioned plan but if maxillary pain persists or change in nasal discharge or new-onset fever -- she is to start the antibiotic I have placed on hold at the pharmacy, taking as directed.  ? ?Follow Up Instructions: ?I discussed the assessment and treatment plan with the patient. The patient was provided an opportunity to ask questions and all were answered. The patient agreed with the plan and demonstrated an understanding of the instructions.  A copy of instructions were sent to the patient via MyChart unless otherwise noted below.  ? ?The patient was advised to call back or seek an in-person evaluation if the symptoms worsen or if the condition fails to improve as anticipated. ? ?Time:  ?I spent 10 minutes with the patient via telehealth technology discussing the above problems/concerns.   ? ?Leeanne Rio, PA-C ?

## 2022-04-22 DIAGNOSIS — J4520 Mild intermittent asthma, uncomplicated: Secondary | ICD-10-CM | POA: Diagnosis not present

## 2022-04-22 DIAGNOSIS — T781XXD Other adverse food reactions, not elsewhere classified, subsequent encounter: Secondary | ICD-10-CM | POA: Diagnosis not present

## 2022-04-22 DIAGNOSIS — J3089 Other allergic rhinitis: Secondary | ICD-10-CM | POA: Diagnosis not present

## 2022-04-22 DIAGNOSIS — J301 Allergic rhinitis due to pollen: Secondary | ICD-10-CM | POA: Diagnosis not present

## 2022-04-22 DIAGNOSIS — J3081 Allergic rhinitis due to animal (cat) (dog) hair and dander: Secondary | ICD-10-CM | POA: Diagnosis not present

## 2022-04-27 DIAGNOSIS — S52612A Displaced fracture of left ulna styloid process, initial encounter for closed fracture: Secondary | ICD-10-CM | POA: Diagnosis not present

## 2022-04-27 DIAGNOSIS — S93401A Sprain of unspecified ligament of right ankle, initial encounter: Secondary | ICD-10-CM | POA: Diagnosis not present

## 2022-04-27 DIAGNOSIS — S52611A Displaced fracture of right ulna styloid process, initial encounter for closed fracture: Secondary | ICD-10-CM | POA: Diagnosis not present

## 2022-04-28 DIAGNOSIS — S63501A Unspecified sprain of right wrist, initial encounter: Secondary | ICD-10-CM | POA: Diagnosis not present

## 2022-04-29 DIAGNOSIS — J301 Allergic rhinitis due to pollen: Secondary | ICD-10-CM | POA: Diagnosis not present

## 2022-04-29 DIAGNOSIS — J3081 Allergic rhinitis due to animal (cat) (dog) hair and dander: Secondary | ICD-10-CM | POA: Diagnosis not present

## 2022-05-02 DIAGNOSIS — J3089 Other allergic rhinitis: Secondary | ICD-10-CM | POA: Diagnosis not present

## 2022-05-03 DIAGNOSIS — F331 Major depressive disorder, recurrent, moderate: Secondary | ICD-10-CM | POA: Diagnosis not present

## 2022-05-04 ENCOUNTER — Ambulatory Visit (INDEPENDENT_AMBULATORY_CARE_PROVIDER_SITE_OTHER): Payer: BC Managed Care – PPO | Admitting: Dermatology

## 2022-05-04 DIAGNOSIS — L7211 Pilar cyst: Secondary | ICD-10-CM | POA: Diagnosis not present

## 2022-05-04 DIAGNOSIS — L720 Epidermal cyst: Secondary | ICD-10-CM | POA: Diagnosis not present

## 2022-05-04 MED ORDER — DOXYCYCLINE MONOHYDRATE 100 MG PO CAPS: 100.0000 mg | ORAL_CAPSULE | Freq: Two times a day (BID) | ORAL | 0 refills | Status: DC

## 2022-05-04 NOTE — Patient Instructions (Addendum)
When finished taking Augmentin, start doxycycline monohydrate '100mg'$  take 1 capsule by mouth twice daily for 2 weeks, then decrease to once daily until finished.  Doxycycline should be taken with food to prevent nausea. Do not lay down for 30 minutes after taking. Be cautious with sun exposure and use good sun protection while on this medication. Pregnant women should not take this medication.     If You Need Anything After Your Visit  If you have any questions or concerns for your doctor, please call our main line at [phone] and press option 4 to reach your doctor's medical assistant. If no one answers, please leave a voicemail as directed and we will return your call as soon as possible. Messages left after 4 pm will be answered the following business day.   You may also send [name] a message via [place]. We typically respond to MyChart messages within 1-2 business days.  For prescription refills, please ask your pharmacy to contact our office. Our fax number is [phone].  If you have an urgent issue when the clinic is closed that cannot wait until the next business day, you can page your doctor at the number below.    Please note that while we do our best to be available for urgent issues outside of office hours, we are not available 24/7.   If you have an urgent issue and are unable to reach [name], you may choose to seek medical care at your doctor's office, retail clinic, urgent care center, or emergency room.  If you have a medical emergency, please immediately call 911 or go to the emergency department.  Pager Numbers  - Dr. Nehemiah Massed: [phone]  - Dr. Laurence Ferrari: [phone]  - Dr. Nicole Kindred: [phone]  In the event of inclement weather, please call our main line at [phone] for an update on the status of any delays or closures.  Dermatology Medication Tips: Please keep the boxes that topical medications come in in order to help keep track of the instructions about where  and how to use these. Pharmacies typically print the medication instructions only on the boxes and not directly on the medication tubes.   If your medication is too expensive, please contact our office at [phone] option 4 or send [name] a message through [place].   We are unable to tell what your co-pay for medications will be in advance as this is different depending on your insurance coverage. However, we may be able to find a substitute medication at lower cost or fill out paperwork to get insurance to cover a needed medication.   If a prior authorization is required to get your medication covered by your insurance company, please allow [name] 1-2 business days to complete this process.  Drug prices often vary depending on where the prescription is filled and some pharmacies may offer cheaper prices.  The website www.goodrx.com contains coupons for medications through different pharmacies. The prices here do not account for what the cost may be with help from insurance (it may be cheaper with your insurance), but the website can give you the price if you did not use any insurance.  - You can print the associated coupon and take it with your prescription to the pharmacy.  - You may also stop by our office during regular business hours and pick up a GoodRx coupon card.  - If you need your prescription sent electronically to a different pharmacy, notify our office through [facility] or by phone at [phone] option  4.     Si Usted Necesita Algo Despus de Su Visita  Tambin puede enviarnos un mensaje a travs de Pharmacist, community. Por lo general respondemos a los mensajes de MyChart en el transcurso de 1 a 2 das hbiles.  Para renovar recetas, por favor pida a su farmacia que se ponga en contacto con nuestra oficina. Harland Dingwall de fax es [place] [phone].  Si tiene un asunto urgente cuando la clnica est cerrada y que no puede esperar hasta el siguiente da hbil, puede llamar/localizar a  su doctor(a) al nmero que aparece a continuacin.   Por favor, tenga en cuenta que aunque hacemos todo lo posible para estar disponibles para asuntos urgentes fuera del horario de [place], no estamos disponibles las 24 horas del da, los 7 das de la [place].   Si tiene un problema urgente y no puede comunicarse con nosotros, puede optar por buscar atencin mdica  en el consultorio de su doctor(a), en una clnica privada, en un centro de atencin urgente o en una sala de emergencias.  Si tiene Engineering geologist, por favor llame inmediatamente al 911 o vaya a la sala de emergencias.  Nmeros de bper  - Dr. Nehemiah Massed: [phone]  - Dra. Moye: [phone]  - Dra. Nicole Kindred: [phone]  En caso de inclemencias del [place], por favor llame a Johnsie Kindred principal al [phone] para una actualizacin sobre el [place] de cualquier retraso o cierre.  Consejos para la medicacin en dermatologa: Por favor, guarde las cajas en las que vienen los medicamentos de uso tpico para ayudarle a seguir las instrucciones sobre dnde y cmo usarlos. Las farmacias generalmente imprimen las instrucciones del medicamento slo en las cajas y no directamente en los tubos del [place].   Si su medicamento es muy caro, por favor, pngase en contacto con Zigmund Daniel llamando al [phone] y presione la opcin 4 o envenos un mensaje a travs de Pharmacist, community.   No podemos decirle cul ser su copago por los medicamentos por adelantado ya que esto es diferente dependiendo de la cobertura de su seguro. Sin embargo, es posible que podamos encontrar un medicamento sustituto a Electrical engineer un formulario para que el seguro cubra el medicamento que se considera necesario.   Si se requiere una autorizacin previa para que su compaa de seguros [place] su medicamento, por favor permtanos de 1 a 2 das hbiles para completar este proceso.  Los precios de los medicamentos varan con frecuencia dependiendo  del Environmental consultant de dnde se surte la receta y alguna farmacias pueden ofrecer precios ms baratos.  El sitio web www.goodrx.com tiene cupones para medicamentos de Airline pilot. Los precios aqu no tienen en cuenta lo que podra costar con la ayuda del seguro (puede ser ms barato con su seguro), pero el sitio web puede darle el precio si no utiliz Research scientist (physical sciences).  - Puede imprimir el cupn correspondiente y llevarlo con su receta a la farmacia.  - Tambin puede pasar por nuestra oficina durante el horario de atencin regular y Charity fundraiser una tarjeta de cupones de GoodRx.  - Si necesita que su receta se enve electrnicamente a una farmacia diferente, informe a nuestra oficina a travs de MyChart de [place] o por telfono llamando al [phone] y presione la opcin 4.

## 2022-05-04 NOTE — Progress Notes (Signed)
   Follow-Up Visit   Subjective  Sharon Benson is a 46 y.o. female who presents for the following: Cyst (Scalp x ~ 3 years, growing and starting to have increased headaches. History of drainage daily. ). She also has a spot to check on her left shoulder, possible cyst with history of rupture that is healing now.    The following portions of the chart were reviewed this encounter and updated as appropriate:       Review of Systems:  No other skin or systemic complaints except as noted in HPI or Assessment and Plan.  Objective  Well appearing patient in no apparent distress; mood and affect are within normal limits.  A focused examination was performed including face, scalp, shoulder. Relevant physical exam findings are noted in the Assessment and Plan.  Right frontal hairline 1.2 cm firm subcutaneous nodule  Left posterior shoulder Hyperpigmented slightly firm nodule, recently drained per pt    Assessment & Plan  Pilar cyst Right frontal hairline  Cyst with symptoms (headaches) and/or recent change (growing/draining).  Discussed surgical excision to remove, including resulting scar and possible recurrence.  Patient unable to tolerate local anesthesia due to anxiety. Will send referral to  Talmadge Coventry, MD since she will need sedation for the procedure.  Will start doxy to try and shrink cyst, since some inflammation present  Start doxycycline monohydrate '100mg'$  take 1 PO bid with food x 2 weeks, then decrease to qd until finished dsp #60 0Rf.  Doxycycline should be taken with food to prevent nausea. Do not lay down for 30 minutes after taking. Be cautious with sun exposure and use good sun protection while on this medication. Pregnant women should not take this medication.    Ambulatory referral to Plastic Surgery - Right frontal hairline  doxycycline (MONODOX) 100 MG capsule - Right frontal hairline Take 1 capsule (100 mg total) by mouth 2 (two) times daily.  Take with food.  Epidermal cyst Left posterior shoulder  Inflamed, improving after rupture/draining at home  Start Doxycycline 100 mg PO bid x 2 weeks, then qd until gone (total 6 weeks)   Return if symptoms worsen or fail to improve.  IJamesetta Orleans, CMA, am acting as scribe for Brendolyn Patty, MD . Documentation: I have reviewed the above documentation for accuracy and completeness, and I agree with the above.  Brendolyn Patty MD

## 2022-05-05 ENCOUNTER — Encounter: Payer: Self-pay | Admitting: Plastic Surgery

## 2022-05-05 ENCOUNTER — Ambulatory Visit (INDEPENDENT_AMBULATORY_CARE_PROVIDER_SITE_OTHER): Payer: BC Managed Care – PPO | Admitting: Plastic Surgery

## 2022-05-05 VITALS — BP 117/81 | HR 75 | Ht 64.0 in | Wt 268.2 lb

## 2022-05-05 DIAGNOSIS — L729 Follicular cyst of the skin and subcutaneous tissue, unspecified: Secondary | ICD-10-CM | POA: Diagnosis not present

## 2022-05-05 DIAGNOSIS — L723 Sebaceous cyst: Secondary | ICD-10-CM

## 2022-05-05 NOTE — Progress Notes (Signed)
Referring Provider Brendolyn Patty, [place] [address],  [place] [address]   CC:  Chief Complaint  Patient presents with   Advice Only      Sharon Benson is an 46 y.o. female.  HPI: Patient presents with a symptomatic cystic lesion in her right upper forehead.  Is been present for a few years.  It intermittently drains.  Is never been treated.  It is painful.  She would like to have it removed.  She would like to have it done in the operating room.  Allergies  Allergen Reactions   Other Anaphylaxis and Itching    Cantaloupe  Makes her tongue swell   Cocoa Butter Hives   Codeine Other (See Comments)    seizures    Outpatient Encounter Medications as of 05/05/2022  Medication Sig   albuterol (PROVENTIL HFA;VENTOLIN HFA) 108 (90 Base) MCG/ACT inhaler Inhale 2 puffs into the lungs every 4 (four) hours as needed for wheezing or shortness of breath.   amoxicillin-clavulanate (AUGMENTIN) 875-125 MG tablet Take 1 tablet by mouth 2 (two) times daily.   doxycycline (MONODOX) 100 MG capsule Take 1 capsule (100 mg total) by mouth 2 (two) times daily. Take with food.   EPINEPHrine 0.3 mg/0.3 mL IJ SOAJ injection Inject 0.3 mg into the muscle as needed for anaphylaxis.   fluticasone (FLONASE) 50 MCG/ACT nasal spray Place 2 sprays into both nostrils daily.   levocetirizine (XYZAL) 5 MG tablet Take 1 tablet (5 mg total) by mouth every evening.   levonorgestrel (MIRENA) 20 MCG/24HR IUD 1 each by Intrauterine route continuous.   levothyroxine (SYNTHROID) 200 MCG tablet Take 200 mcg by mouth daily before breakfast.   Liraglutide -Weight Management (SAXENDA) 18 MG/3ML SOPN Inject 1.8 mg into the skin daily.   montelukast (SINGULAIR) 10 MG tablet Take 10 mg by mouth daily.   thyroid (ARMOUR) 60 MG tablet Take 60 mg by mouth daily before breakfast.   No facility-administered encounter medications on file as of 05/05/2022.     Past Medical History:  Diagnosis Date   Anxiety     panic attack - not recent 2001ish   Arthritis    Asthma    Cancer ([place])    thyroid cancer 2016   Dizziness    Endometriosis    Fibroid    GERD (gastroesophageal reflux disease)    Headache(784.0)    History of multiple miscarriages    Hypothyroidism    Insomnia    Palpitations    PCOS (polycystic ovarian syndrome)    PONV (postoperative nausea and vomiting)    last 2- no vomiting-  Seizures- not with last 2.   Seizures ([place])    last seizure 04/2017   SVD (spontaneous vaginal delivery)    x 1   Vertigo    Vitamin D deficiency     Past Surgical History:  Procedure Laterality Date   BREAST REDUCTION SURGERY     BREAST SURGERY  1997   DILATION AND EVACUATION N/A 11/25/2013   Procedure: DILATATION AND EVACUATION;  Surgeon: Woodroe Mode, MD;  Location: [facility];  Service: Gynecology;  Laterality: N/A;   DILATION AND EVACUATION N/A 11/30/2013   Procedure: DILATATION AND EVACUATION;  Surgeon: Jonnie Kind, MD;  Location: [facility];  Service: Gynecology;  Laterality: N/A;   DILATION AND EVACUATION N/A 05/02/2014   Procedure: DILATATION AND EVACUATION;  Surgeon: Ena Dawley, MD;  Location: [facility];  Service: Gynecology;  Laterality: N/A;   [place]  right hand   mirena     REDUCTION MAMMAPLASTY Bilateral    THYROIDECTOMY Bilateral 01/02/2015   Procedure: TOTAL THYROIDECTOMY;  Surgeon: Melissa Montane, MD;  Location: [facility];  Service: ENT;  Laterality: Bilateral;    Family History  Problem Relation Age of Onset   Lung cancer Maternal Grandmother    Colon cancer Maternal Grandmother    Cancer Other    Diabetes Other    High blood pressure Other    Diabetes Mother    Hypertension Father    Colon cancer Father        Dx at age 16 and it spread to his lungs, liver and bones   Hypertension Paternal Grandmother    Diabetes Paternal Grandmother    Heart disease Paternal Grandmother    Esophageal cancer Neg Hx    Liver disease Neg Hx    Pancreatic cancer Neg Hx     Stomach cancer Neg Hx     Social History   Social History Narrative   Lives with husband, child 2 yrs old   caffeine coffee ,tea     Review of Systems General: Denies fevers, chills, weight loss CV: Denies chest pain, shortness of breath, palpitations  Physical Exam    05/05/2022    1:09 PM 01/12/2022    9:27 PM 11/24/2021    2:05 PM  Vitals with BMI  Height '5\' 4"'$     Weight 268 lbs 3 oz    BMI 00.92    Systolic 330 076 226  Diastolic 81 84 71  Pulse 75 90 75    General:  No acute distress,  Alert and oriented, Non-Toxic, Normal speech and affect Examination shows 2 to 3 cm cystic lesion right at the hairline in the right forehead.  Small amount of drainage to palpation.  No other surrounding scars or skin changes.  Assessment/Plan Patient presents with a cystic lesion of the right upper forehead.  We discussed excision.  We discussed risks include bleeding, infection, damage to surrounding structures and need for additional procedures.  All of her questions were answered we will plan to move forward with scheduling in the operating room at her request.  Cindra Presume 05/05/2022, 1:32 PM

## 2022-05-06 ENCOUNTER — Institutional Professional Consult (permissible substitution): Payer: BC Managed Care – PPO | Admitting: Plastic Surgery

## 2022-05-09 DIAGNOSIS — F331 Major depressive disorder, recurrent, moderate: Secondary | ICD-10-CM | POA: Diagnosis not present

## 2022-05-16 DIAGNOSIS — F331 Major depressive disorder, recurrent, moderate: Secondary | ICD-10-CM | POA: Diagnosis not present

## 2022-05-18 DIAGNOSIS — S63501A Unspecified sprain of right wrist, initial encounter: Secondary | ICD-10-CM | POA: Diagnosis not present

## 2022-05-23 DIAGNOSIS — F331 Major depressive disorder, recurrent, moderate: Secondary | ICD-10-CM | POA: Diagnosis not present

## 2022-05-25 ENCOUNTER — Telehealth: Payer: Self-pay | Admitting: Plastic Surgery

## 2022-05-25 NOTE — Telephone Encounter (Signed)
LVM to discuss surgery date 

## 2022-05-26 DIAGNOSIS — M25631 Stiffness of right wrist, not elsewhere classified: Secondary | ICD-10-CM | POA: Diagnosis not present

## 2022-05-26 DIAGNOSIS — S63501D Unspecified sprain of right wrist, subsequent encounter: Secondary | ICD-10-CM | POA: Diagnosis not present

## 2022-05-26 DIAGNOSIS — M6281 Muscle weakness (generalized): Secondary | ICD-10-CM | POA: Diagnosis not present

## 2022-05-29 ENCOUNTER — Encounter: Payer: Self-pay | Admitting: Medical Oncology

## 2022-05-29 ENCOUNTER — Emergency Department: Payer: BC Managed Care – PPO

## 2022-05-29 ENCOUNTER — Emergency Department
Admission: EM | Admit: 2022-05-29 | Discharge: 2022-05-29 | Disposition: A | Discharge status: Home / Self Care | Payer: BC Managed Care – PPO | Attending: Emergency Medicine | Admitting: Emergency Medicine

## 2022-05-29 DIAGNOSIS — S99911A Unspecified injury of right ankle, initial encounter: Secondary | ICD-10-CM | POA: Diagnosis not present

## 2022-05-29 DIAGNOSIS — X500XXA Overexertion from strenuous movement or load, initial encounter: Secondary | ICD-10-CM | POA: Insufficient documentation

## 2022-05-29 DIAGNOSIS — S89209A Unspecified physeal fracture of upper end of unspecified fibula, initial encounter for closed fracture: Secondary | ICD-10-CM | POA: Insufficient documentation

## 2022-05-29 DIAGNOSIS — M25571 Pain in right ankle and joints of right foot: Secondary | ICD-10-CM | POA: Diagnosis not present

## 2022-05-29 DIAGNOSIS — R600 Localized edema: Secondary | ICD-10-CM | POA: Diagnosis not present

## 2022-05-29 DIAGNOSIS — S82831A Other fracture of upper and lower end of right fibula, initial encounter for closed fracture: Secondary | ICD-10-CM | POA: Diagnosis not present

## 2022-05-29 DIAGNOSIS — S82839A Other fracture of upper and lower end of unspecified fibula, initial encounter for closed fracture: Secondary | ICD-10-CM

## 2022-05-29 DIAGNOSIS — M7989 Other specified soft tissue disorders: Secondary | ICD-10-CM | POA: Diagnosis not present

## 2022-05-29 DIAGNOSIS — R209 Unspecified disturbances of skin sensation: Secondary | ICD-10-CM | POA: Diagnosis not present

## 2022-05-29 LAB — BASIC METABOLIC PANEL
Anion gap: 11 (ref 5–15)
BUN: 12 mg/dL (ref 6–20)
CO2: 23 mmol/L (ref 22–32)
Calcium: 10 mg/dL (ref 8.9–10.3)
Chloride: 105 mmol/L (ref 98–111)
Creatinine, Ser: 0.85 mg/dL (ref 0.44–1.00)
GFR, Estimated: >60 mL/min (ref >60)
Glucose, Bld: 94 mg/dL (ref 70–99)
Potassium: 4.1 mmol/L (ref 3.5–5.1)
Sodium: 139 mmol/L (ref 135–145)

## 2022-05-29 LAB — VITAMIN D 25 HYDROXY (VIT D DEFICIENCY, FRACTURES): Vit D, 25-Hydroxy: 41.2 ng/mL (ref 30–100)

## 2022-05-29 MED ORDER — IBUPROFEN 800 MG PO TABS
800.0000 mg | ORAL_TABLET | Freq: Once | ORAL | Status: AC
  Administered 2022-05-29: 800 mg via ORAL
  Filled 2022-05-29: qty 1

## 2022-05-29 MED ORDER — IBUPROFEN 800 MG PO TABS: 800.0000 mg | ORAL_TABLET | Freq: Three times a day (TID) | ORAL | 0 refills | Status: AC | PRN

## 2022-05-29 NOTE — ED Provider Notes (Signed)
[facility] Provider Note    Event Date/Time   First MD Initiated Contact with Patient 05/29/22 1337     (approximate)   History   Ankle Pain   HPI  Sharon Benson is a 46 y.o. female with history of recent fractures presents emergency department with pain to the right leg and right ankle.  Patient states that she felt a pop in her leg.  Painful to bear weight.  [facility] and was told that they could not feel a pulse so they sent her here.      Physical Exam   Triage Vital Signs: ED Triage Vitals [05/29/22 1316]  Enc Vitals Group     BP 139/78     Pulse Rate (!) 101     Resp 18     Temp 99 F (37.2 C)     Temp Source Oral     SpO2 97 %     Weight 268 lb (121.6 kg)     Height '5\' 4"'$  (1.626 m)     Head Circumference      Peak Flow      Pain Score 10     Pain Loc      Pain Edu?      Excl. in [place]?     Most recent vital signs: Vitals:   05/29/22 1316  BP: 139/78  Pulse: (!) 101  Resp: 18  Temp: 99 F (37.2 C)  SpO2: 97%     General: Awake, no distress.   CV:  Good peripheral perfusion. regular rate and  rhythm Resp:  Normal effort.  Abd:  No distention.   Other:  Right ankle is tender and swollen, dorsalis pedis pulse is noted to be intact, patient does have good cap refill of the toes, right upper tib/fib is tender to palpation   ED Results / Procedures / Treatments   Labs (all labs ordered are listed, but only abnormal results are displayed) Labs Reviewed  BASIC METABOLIC PANEL  VITAMIN D 25 HYDROXY (VIT D DEFICIENCY, FRACTURES)     EKG     RADIOLOGY X-rays ordered from triage with x-ray of the right tib-fib, right ankle and right foot    PROCEDURES:   Procedures   MEDICATIONS ORDERED IN ED: Medications  ibuprofen (ADVIL) tablet 800 mg (800 mg Oral Given 05/29/22 1407)     IMPRESSION / MDM / [place] / ED COURSE  I reviewed the triage vital signs and the nursing notes.                               Differential diagnosis includes, but is not limited to, fracture, sprain, contusion  Patient's presentation is most consistent with acute complicated illness / injury requiring diagnostic workup.   X-rays ordered from triage of right tib-fib, right ankle and right foot interpreted by me as having a proximal fibula fracture, radiologist comments on chip fracture on the medial aspect of the right ankle which could indicate ligamentous injury.  Patient was placed in a long-leg OCL.  I did explain these findings to her.  She is to not bear weight until she is seen by her regular orthopedist.  She is to call and make an appointment.  Elevate and ice.  Take pain medication as needed.  Patient is in agreement treatment plan.  Also ordered basic metabolic panel and vitamin D to assess her calcium and  vitamin D levels.  Basic metabolic panel is normal.  Patient is starting to take vitamin D now which may help with her frequent fractures      FINAL CLINICAL IMPRESSION(S) / ED DIAGNOSES   Final diagnoses:  Closed fracture of proximal end of fibula, unspecified fracture morphology, initial encounter     Rx / DC Orders   ED Discharge Orders          Ordered    ibuprofen (ADVIL) 800 MG tablet  Every 8 hours PRN        05/29/22 1427             Note:  This document was prepared using Dragon voice recognition software and may include unintentional dictation errors.    Versie Starks, PA-C 05/29/22 1434    Arta Silence, MD 05/29/22 1616

## 2022-05-29 NOTE — ED Triage Notes (Signed)
Pt here- [facility], pt was standing on bed and jumped off, then felt a "pop" to rt ankle. [facility] stated they were unable to find a pulse in the foot. Pulse found in triage and marked.

## 2022-05-29 NOTE — ED Notes (Signed)
Dc ppw provided to patient. Pt questions and followup reviewed. PT declines vs at time of dc and provides verbal consent for DC. Pt alert and oriented on foot to lobby.

## 2022-05-29 NOTE — ED Provider Triage Note (Signed)
Emergency Medicine Provider Triage Evaluation Note  Sharon Benson , a 46 y.o. female  was evaluated in triage.  Pt complains of right ankle pain. She reports that she jumped off her bed and twisted her ankle. She felt a pop. She reports that she cannot move her ankle due to pain. No headstrike or LOC. Went to urgent care, they sent her here because they could not feel her pulses.   Review of Systems  Positive: Ankle pain, +decreased sensation, trouble moving toes Negative: Knee/hip pain  Physical Exam  There were no vitals taken for this visit. Gen:   Awake, no distress   Resp:  Normal effort  MSK:   Moves extremities without difficulty  Other:  TTP to medial and lateral malleoli. Soft tissue swelling. Cannot palpate pulses but dopplerable pulses (marked). TTP to proximal fibula  Medical Decision Making  Medically screening exam initiated at 1:06 PM.  Appropriate orders placed.  Sharon Benson was informed that the remainder of the evaluation will be completed by another provider, this initial triage assessment does not replace that evaluation, and the importance of remaining in the ED until their evaluation is complete.     Sharon Old, PA-C 05/29/22 1316

## 2022-05-30 DIAGNOSIS — F331 Major depressive disorder, recurrent, moderate: Secondary | ICD-10-CM | POA: Diagnosis not present

## 2022-06-02 ENCOUNTER — Telehealth: Payer: Self-pay | Admitting: Plastic Surgery

## 2022-06-02 DIAGNOSIS — M25571 Pain in right ankle and joints of right foot: Secondary | ICD-10-CM | POA: Diagnosis not present

## 2022-06-02 NOTE — Telephone Encounter (Signed)
LVM to see if patient interested in scheduling surgery. 2nd call made.

## 2022-06-06 DIAGNOSIS — F331 Major depressive disorder, recurrent, moderate: Secondary | ICD-10-CM | POA: Diagnosis not present

## 2022-06-08 DIAGNOSIS — G8918 Other acute postprocedural pain: Secondary | ICD-10-CM | POA: Diagnosis not present

## 2022-06-08 DIAGNOSIS — S93401A Sprain of unspecified ligament of right ankle, initial encounter: Secondary | ICD-10-CM | POA: Diagnosis not present

## 2022-06-08 DIAGNOSIS — S43004A Unspecified dislocation of right shoulder joint, initial encounter: Secondary | ICD-10-CM | POA: Diagnosis not present

## 2022-06-08 DIAGNOSIS — X58XXXA Exposure to other specified factors, initial encounter: Secondary | ICD-10-CM | POA: Diagnosis not present

## 2022-06-08 DIAGNOSIS — Y999 Unspecified external cause status: Secondary | ICD-10-CM | POA: Diagnosis not present

## 2022-06-08 DIAGNOSIS — S43001A Unspecified subluxation of right shoulder joint, initial encounter: Secondary | ICD-10-CM | POA: Diagnosis not present

## 2022-06-09 ENCOUNTER — Telehealth: Payer: Self-pay | Admitting: Plastic Surgery

## 2022-06-09 NOTE — Telephone Encounter (Signed)
Patient unable to do surgery at this time as she broke her foot/ankle and had surgery yesterday. She will be recovering for 10-12 weeks so has to put this surgery on hold. Advised patient that once she has recuperated and wishes to still have this surgery done to call our office to get an appt to discuss with Dr. Claudia Desanctis again and we can move forward at that time. Patient apologized and understands.

## 2022-06-13 DIAGNOSIS — F331 Major depressive disorder, recurrent, moderate: Secondary | ICD-10-CM | POA: Diagnosis not present

## 2022-06-16 DIAGNOSIS — S93401D Sprain of unspecified ligament of right ankle, subsequent encounter: Secondary | ICD-10-CM | POA: Diagnosis not present

## 2022-06-20 DIAGNOSIS — F331 Major depressive disorder, recurrent, moderate: Secondary | ICD-10-CM | POA: Diagnosis not present

## 2022-06-21 DIAGNOSIS — F331 Major depressive disorder, recurrent, moderate: Secondary | ICD-10-CM | POA: Diagnosis not present

## 2022-06-27 DIAGNOSIS — F331 Major depressive disorder, recurrent, moderate: Secondary | ICD-10-CM | POA: Diagnosis not present

## 2022-07-04 DIAGNOSIS — F331 Major depressive disorder, recurrent, moderate: Secondary | ICD-10-CM | POA: Diagnosis not present

## 2022-07-11 DIAGNOSIS — F331 Major depressive disorder, recurrent, moderate: Secondary | ICD-10-CM | POA: Diagnosis not present

## 2022-07-11 DIAGNOSIS — S93401D Sprain of unspecified ligament of right ankle, subsequent encounter: Secondary | ICD-10-CM | POA: Diagnosis not present

## 2022-07-13 DIAGNOSIS — S93431D Sprain of tibiofibular ligament of right ankle, subsequent encounter: Secondary | ICD-10-CM | POA: Diagnosis not present

## 2022-07-13 DIAGNOSIS — M6281 Muscle weakness (generalized): Secondary | ICD-10-CM | POA: Diagnosis not present

## 2022-07-13 DIAGNOSIS — R269 Unspecified abnormalities of gait and mobility: Secondary | ICD-10-CM | POA: Diagnosis not present

## 2022-07-13 DIAGNOSIS — M25671 Stiffness of right ankle, not elsewhere classified: Secondary | ICD-10-CM | POA: Diagnosis not present

## 2022-07-19 DIAGNOSIS — M25671 Stiffness of right ankle, not elsewhere classified: Secondary | ICD-10-CM | POA: Diagnosis not present

## 2022-07-19 DIAGNOSIS — R269 Unspecified abnormalities of gait and mobility: Secondary | ICD-10-CM | POA: Diagnosis not present

## 2022-07-19 DIAGNOSIS — S93431D Sprain of tibiofibular ligament of right ankle, subsequent encounter: Secondary | ICD-10-CM | POA: Diagnosis not present

## 2022-07-19 DIAGNOSIS — M6281 Muscle weakness (generalized): Secondary | ICD-10-CM | POA: Diagnosis not present

## 2022-07-25 DIAGNOSIS — F331 Major depressive disorder, recurrent, moderate: Secondary | ICD-10-CM | POA: Diagnosis not present

## 2022-08-01 DIAGNOSIS — F331 Major depressive disorder, recurrent, moderate: Secondary | ICD-10-CM | POA: Diagnosis not present

## 2022-08-02 DIAGNOSIS — S93431D Sprain of tibiofibular ligament of right ankle, subsequent encounter: Secondary | ICD-10-CM | POA: Diagnosis not present

## 2022-08-02 DIAGNOSIS — M6281 Muscle weakness (generalized): Secondary | ICD-10-CM | POA: Diagnosis not present

## 2022-08-02 DIAGNOSIS — M25671 Stiffness of right ankle, not elsewhere classified: Secondary | ICD-10-CM | POA: Diagnosis not present

## 2022-08-02 DIAGNOSIS — R269 Unspecified abnormalities of gait and mobility: Secondary | ICD-10-CM | POA: Diagnosis not present

## 2022-08-09 DIAGNOSIS — M25671 Stiffness of right ankle, not elsewhere classified: Secondary | ICD-10-CM | POA: Diagnosis not present

## 2022-08-09 DIAGNOSIS — R269 Unspecified abnormalities of gait and mobility: Secondary | ICD-10-CM | POA: Diagnosis not present

## 2022-08-09 DIAGNOSIS — S93431D Sprain of tibiofibular ligament of right ankle, subsequent encounter: Secondary | ICD-10-CM | POA: Diagnosis not present

## 2022-08-09 DIAGNOSIS — M6281 Muscle weakness (generalized): Secondary | ICD-10-CM | POA: Diagnosis not present

## 2022-08-16 DIAGNOSIS — M25671 Stiffness of right ankle, not elsewhere classified: Secondary | ICD-10-CM | POA: Diagnosis not present

## 2022-08-16 DIAGNOSIS — M6281 Muscle weakness (generalized): Secondary | ICD-10-CM | POA: Diagnosis not present

## 2022-08-16 DIAGNOSIS — S93431D Sprain of tibiofibular ligament of right ankle, subsequent encounter: Secondary | ICD-10-CM | POA: Diagnosis not present

## 2022-08-16 DIAGNOSIS — R269 Unspecified abnormalities of gait and mobility: Secondary | ICD-10-CM | POA: Diagnosis not present

## 2022-08-20 IMAGING — CT CT ANGIO CHEST
2 of 7 series · 18 of 46 positions shown · IV contrast (omnipaque)
Comparison: Chest radiography yesterday.  CT 03/26/2021.

CLINICAL DATA: Pulmonary embolism suspected. Abnormal D-dimer.
Chest pain and shortness of breath.

EXAM:
CT ANGIOGRAPHY CHEST WITH CONTRAST
TECHNIQUE: Multidetector CT imaging of the chest was performed using the
standard protocol during bolus administration of intravenous
contrast. Multiplanar CT image reconstructions and MIPs were
obtained to evaluate the vascular anatomy.
CONTRAST:  65mL OMNIPAQUE IOHEXOL 350 MG/ML SOLN

[Series 7: thins · axial · 0.94mm/px · z∈[+1021,+1301]mm · 15 of 451 slices shown]
[im 26/451  lung]
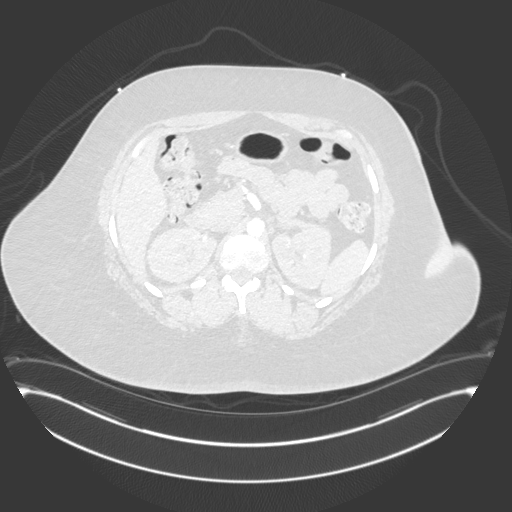
[im 51/451  soft-tissue]
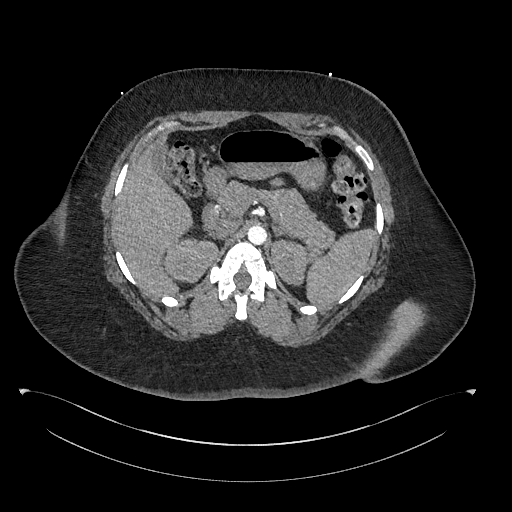
[im 76/451  lung]
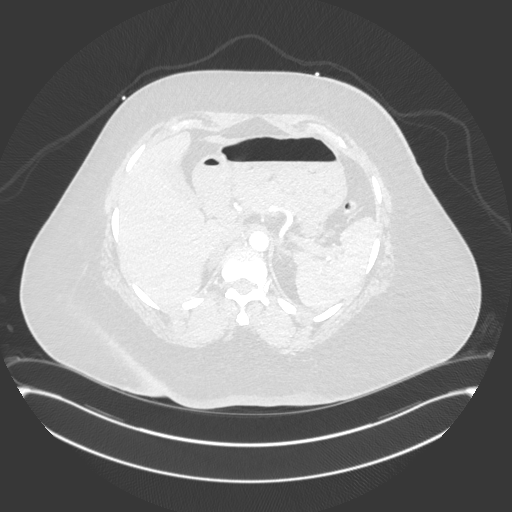
[im 101/451  soft-tissue]
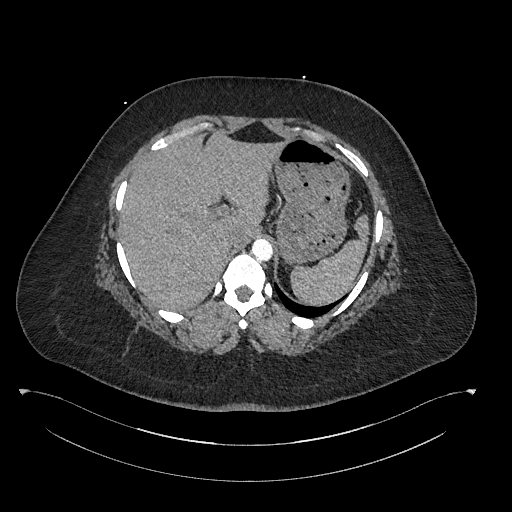
[im 151/451  lung]
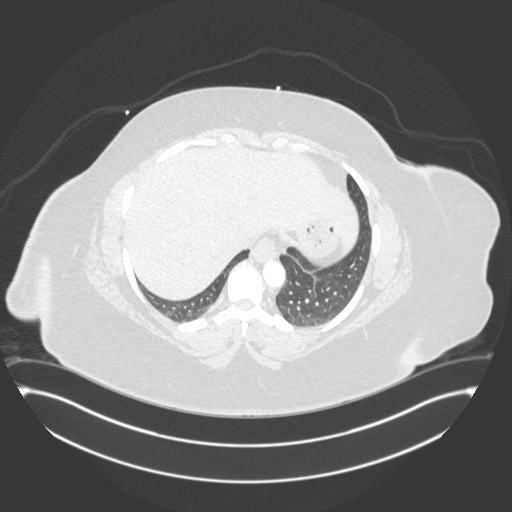
[im 176/451  soft-tissue]
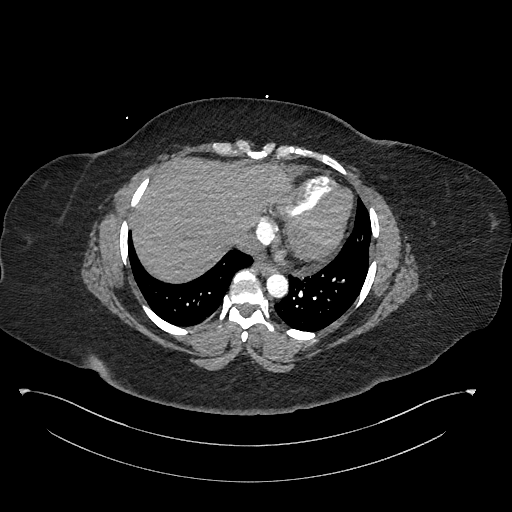
[im 201/451  lung]
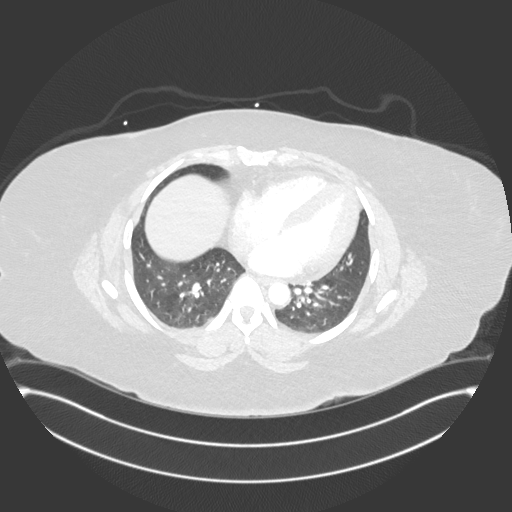
[im 226/451  soft-tissue]
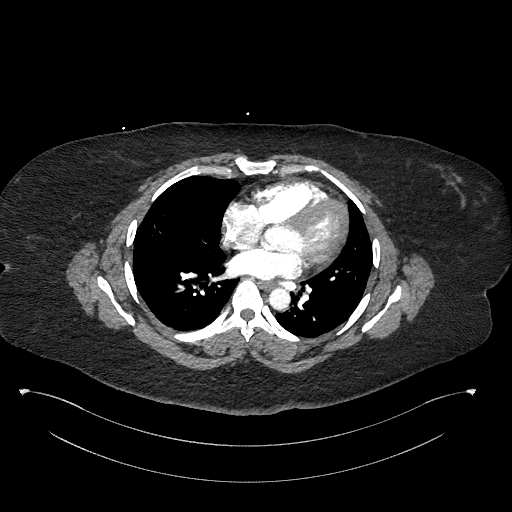
[im 251/451  lung]
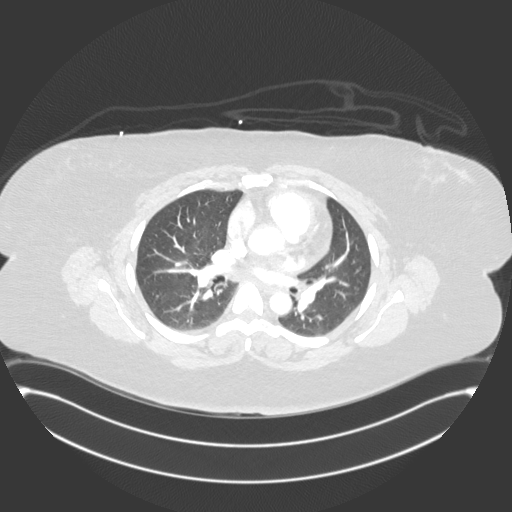
[im 276/451  soft-tissue]
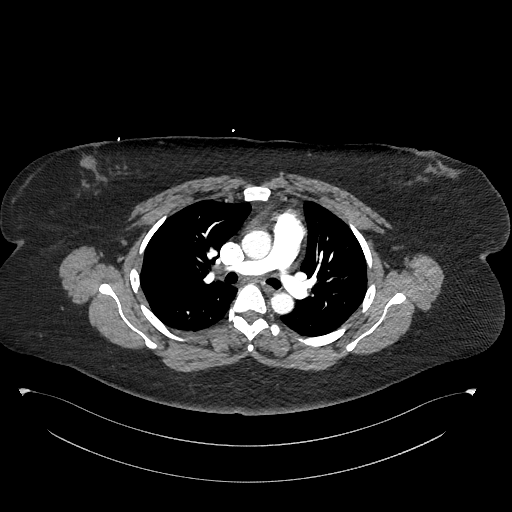
[im 301/451  lung]
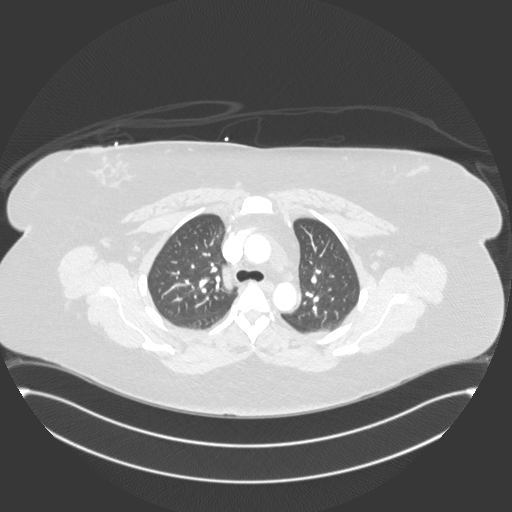
[im 351/451  soft-tissue]
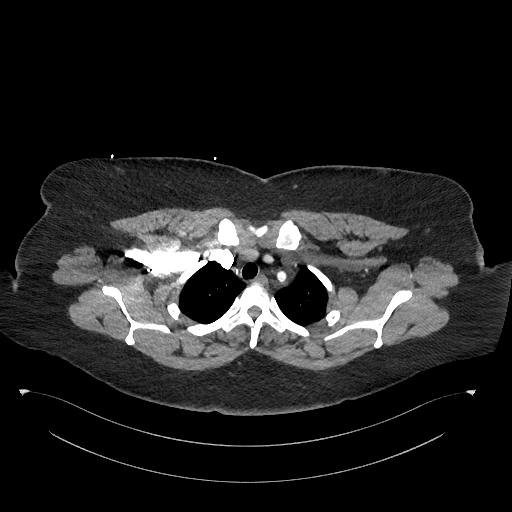
[im 376/451  lung]
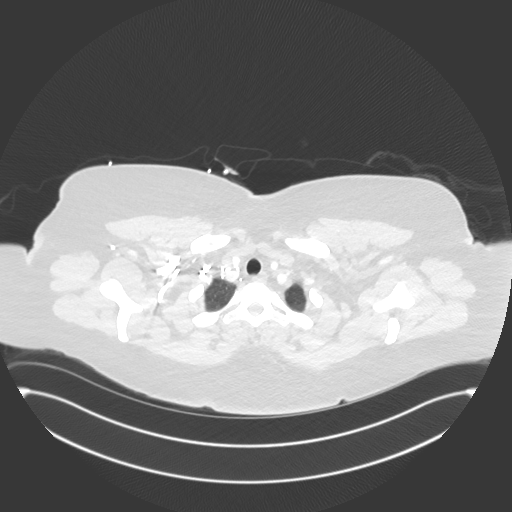
[im 401/451  soft-tissue]
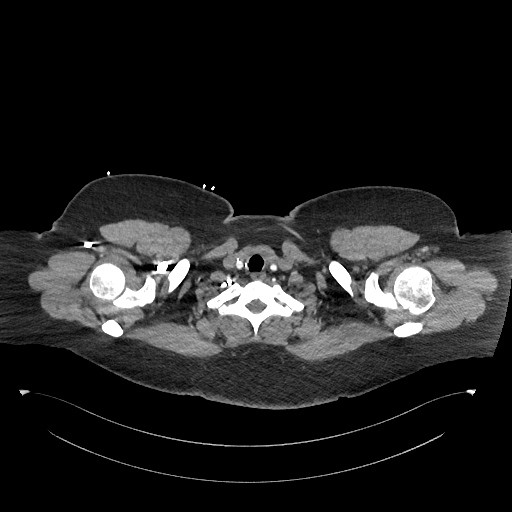
[im 426/451  lung]
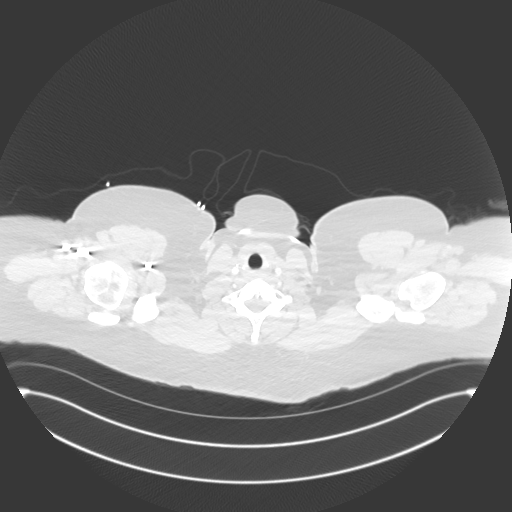

[Series 8: cor · coronal · 0.68mm/px · 3 of 166 slices shown]
[im 42/166  soft-tissue]
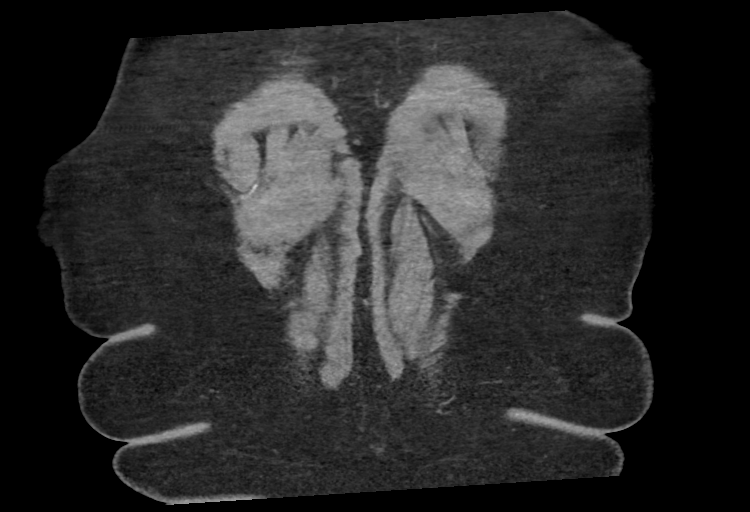
[im 83/166  soft-tissue]
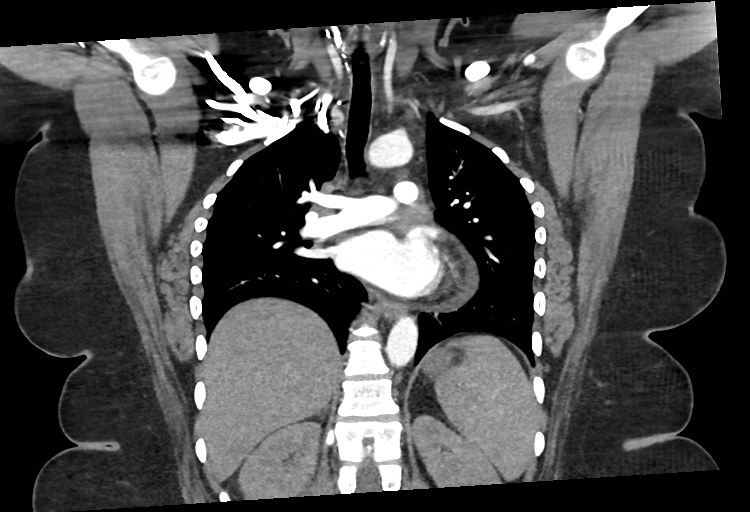
[im 124/166  soft-tissue]
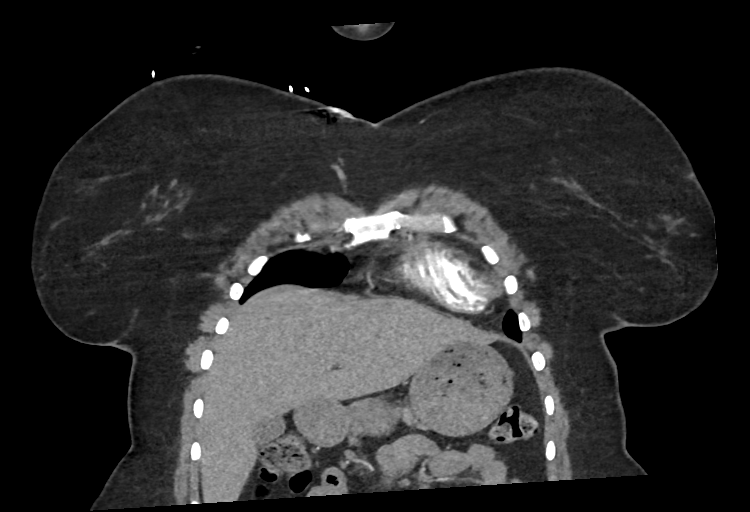

[18 of 46 positions shown; findings below may reference images not displayed]

FINDINGS: Cardiovascular: Heart size is normal. No pericardial fluid. No
definite coronary artery calcification. No aortic atherosclerotic
calcification or aneurysm. Pulmonary arterial opacification is good.
There are no pulmonary emboli.

Mediastinum/Nodes: No mass or adenopathy.

Lungs/Pleura: No pleural effusion. Lung parenchyma is clear. No
infiltrate, collapse, mass or nodule.

Upper Abdomen: Normal

Musculoskeletal: Normal

Review of the MIP images confirms the above findings.
IMPRESSION: Normal study.  No pulmonary emboli or other visible chest pathology.

## 2022-08-22 DIAGNOSIS — F331 Major depressive disorder, recurrent, moderate: Secondary | ICD-10-CM | POA: Diagnosis not present

## 2022-08-23 DIAGNOSIS — S93431D Sprain of tibiofibular ligament of right ankle, subsequent encounter: Secondary | ICD-10-CM | POA: Diagnosis not present

## 2022-08-23 DIAGNOSIS — M25671 Stiffness of right ankle, not elsewhere classified: Secondary | ICD-10-CM | POA: Diagnosis not present

## 2022-08-23 DIAGNOSIS — M6281 Muscle weakness (generalized): Secondary | ICD-10-CM | POA: Diagnosis not present

## 2022-08-23 DIAGNOSIS — R269 Unspecified abnormalities of gait and mobility: Secondary | ICD-10-CM | POA: Diagnosis not present

## 2022-08-29 DIAGNOSIS — F331 Major depressive disorder, recurrent, moderate: Secondary | ICD-10-CM | POA: Diagnosis not present

## 2022-08-30 DIAGNOSIS — R269 Unspecified abnormalities of gait and mobility: Secondary | ICD-10-CM | POA: Diagnosis not present

## 2022-08-30 DIAGNOSIS — S93431D Sprain of tibiofibular ligament of right ankle, subsequent encounter: Secondary | ICD-10-CM | POA: Diagnosis not present

## 2022-08-30 DIAGNOSIS — M25671 Stiffness of right ankle, not elsewhere classified: Secondary | ICD-10-CM | POA: Diagnosis not present

## 2022-08-30 DIAGNOSIS — M6281 Muscle weakness (generalized): Secondary | ICD-10-CM | POA: Diagnosis not present

## 2022-09-05 DIAGNOSIS — F331 Major depressive disorder, recurrent, moderate: Secondary | ICD-10-CM | POA: Diagnosis not present

## 2022-09-06 DIAGNOSIS — M25571 Pain in right ankle and joints of right foot: Secondary | ICD-10-CM | POA: Diagnosis not present

## 2022-09-13 DIAGNOSIS — F331 Major depressive disorder, recurrent, moderate: Secondary | ICD-10-CM | POA: Diagnosis not present

## 2022-09-15 ENCOUNTER — Other Ambulatory Visit: Payer: Self-pay | Admitting: Orthopedic Surgery

## 2022-09-15 DIAGNOSIS — S93491A Sprain of other ligament of right ankle, initial encounter: Secondary | ICD-10-CM

## 2022-09-20 ENCOUNTER — Ambulatory Visit
Admission: RE | Admit: 2022-09-20 | Discharge: 2022-09-20 | Disposition: A | Discharge status: Home / Self Care | Payer: BC Managed Care – PPO | Source: Ambulatory Visit | Attending: Orthopedic Surgery | Admitting: Orthopedic Surgery

## 2022-09-20 DIAGNOSIS — S93491A Sprain of other ligament of right ankle, initial encounter: Secondary | ICD-10-CM

## 2022-09-20 DIAGNOSIS — R269 Unspecified abnormalities of gait and mobility: Secondary | ICD-10-CM | POA: Diagnosis not present

## 2022-09-28 DIAGNOSIS — M25571 Pain in right ankle and joints of right foot: Secondary | ICD-10-CM | POA: Diagnosis not present

## 2022-10-04 DIAGNOSIS — F331 Major depressive disorder, recurrent, moderate: Secondary | ICD-10-CM | POA: Diagnosis not present

## 2022-10-07 DIAGNOSIS — M25571 Pain in right ankle and joints of right foot: Secondary | ICD-10-CM | POA: Diagnosis not present

## 2022-10-10 DIAGNOSIS — F331 Major depressive disorder, recurrent, moderate: Secondary | ICD-10-CM | POA: Diagnosis not present

## 2022-10-18 DIAGNOSIS — R0689 Other abnormalities of breathing: Secondary | ICD-10-CM | POA: Diagnosis not present

## 2022-10-18 DIAGNOSIS — R5383 Other fatigue: Secondary | ICD-10-CM | POA: Diagnosis not present

## 2022-10-18 DIAGNOSIS — E039 Hypothyroidism, unspecified: Secondary | ICD-10-CM | POA: Diagnosis not present

## 2022-10-18 DIAGNOSIS — E669 Obesity, unspecified: Secondary | ICD-10-CM | POA: Diagnosis not present

## 2022-10-18 DIAGNOSIS — E559 Vitamin D deficiency, unspecified: Secondary | ICD-10-CM | POA: Diagnosis not present

## 2022-12-23 DIAGNOSIS — M25571 Pain in right ankle and joints of right foot: Secondary | ICD-10-CM | POA: Diagnosis not present

## 2023-03-09 ENCOUNTER — Encounter: Payer: Self-pay | Admitting: Nurse Practitioner

## 2023-03-09 ENCOUNTER — Ambulatory Visit: Payer: 59 | Admitting: Nurse Practitioner

## 2023-03-09 ENCOUNTER — Ambulatory Visit (INDEPENDENT_AMBULATORY_CARE_PROVIDER_SITE_OTHER)
Admission: RE | Admit: 2023-03-09 | Discharge: 2023-03-09 | Disposition: A | Discharge status: Home / Self Care | Payer: 59 | Source: Ambulatory Visit | Attending: Nurse Practitioner | Admitting: Nurse Practitioner

## 2023-03-09 VITALS — BP 120/74 | HR 80 | Temp 98.0°F | Resp 16 | Ht 64.0 in | Wt 273.5 lb

## 2023-03-09 DIAGNOSIS — R0602 Shortness of breath: Secondary | ICD-10-CM

## 2023-03-09 DIAGNOSIS — E039 Hypothyroidism, unspecified: Secondary | ICD-10-CM

## 2023-03-09 DIAGNOSIS — K2100 Gastro-esophageal reflux disease with esophagitis, without bleeding: Secondary | ICD-10-CM

## 2023-03-09 DIAGNOSIS — G43109 Migraine with aura, not intractable, without status migrainosus: Secondary | ICD-10-CM

## 2023-03-09 DIAGNOSIS — Z87898 Personal history of other specified conditions: Secondary | ICD-10-CM | POA: Diagnosis not present

## 2023-03-09 DIAGNOSIS — J4530 Mild persistent asthma, uncomplicated: Secondary | ICD-10-CM | POA: Diagnosis not present

## 2023-03-09 DIAGNOSIS — R591 Generalized enlarged lymph nodes: Secondary | ICD-10-CM

## 2023-03-09 LAB — COMPREHENSIVE METABOLIC PANEL
ALT: 8 U/L (ref 0–35)
AST: 13 U/L (ref 0–37)
Albumin: 4.1 g/dL (ref 3.5–5.2)
Alkaline Phosphatase: 63 U/L (ref 39–117)
BUN: 14 mg/dL (ref 6–23)
CO2: 31 mEq/L (ref 19–32)
Calcium: 9.4 mg/dL (ref 8.4–10.5)
Chloride: 100 mEq/L (ref 96–112)
Creatinine, Ser: 0.91 mg/dL (ref 0.40–1.20)
GFR: 76.61 mL/min (ref >60.00)
Glucose, Bld: 76 mg/dL (ref 70–99)
Potassium: 3.9 mEq/L (ref 3.5–5.1)
Sodium: 138 mEq/L (ref 135–145)
Total Bilirubin: 0.5 mg/dL (ref 0.2–1.2)
Total Protein: 7 g/dL (ref 6.0–8.3)

## 2023-03-09 LAB — TSH: TSH: 76.67 u[IU]/mL — ABNORMAL HIGH (ref 0.35–5.50)

## 2023-03-09 LAB — CBC
HCT: 39.7 % (ref 36.0–46.0)
Hemoglobin: 13.1 g/dL (ref 12.0–15.0)
MCHC: 33.1 g/dL (ref 30.0–36.0)
MCV: 87.1 fl (ref 78.0–100.0)
Platelets: 241 10*3/uL (ref 150.0–400.0)
RBC: 4.56 Mil/uL (ref 3.87–5.11)
RDW: 14 % (ref 11.5–15.5)
WBC: 5.5 10*3/uL (ref 4.0–10.5)

## 2023-03-09 LAB — LIPID PANEL
Cholesterol: 194 mg/dL (ref 0–200)
HDL: 55.1 mg/dL (ref >39.00)
LDL Cholesterol: 126 mg/dL — ABNORMAL HIGH (ref 0–99)
NonHDL: 138.94
Total CHOL/HDL Ratio: 4
Triglycerides: 64 mg/dL (ref 0.0–149.0)
VLDL: 12.8 mg/dL (ref 0.0–40.0)

## 2023-03-09 LAB — HEMOGLOBIN A1C: Hgb A1c MFr Bld: 5.7 % (ref 4.6–6.5)

## 2023-03-09 MED ORDER — PANTOPRAZOLE SODIUM 20 MG PO TBEC: 20.0000 mg | DELAYED_RELEASE_TABLET | Freq: Every day | ORAL | 0 refills | Status: DC

## 2023-03-09 MED ORDER — PREDNISONE 20 MG PO TABS: ORAL_TABLET | ORAL | 0 refills | Status: AC

## 2023-03-09 NOTE — Assessment & Plan Note (Signed)
Patient has had an endoscopy that did not reflect the need for PPI.  She does not take medicine.  Do think raspiness and coughing up stuff in the morning could be secondary to acid reflux will put patient on Protonix 20 mg daily for the next 3 months

## 2023-03-09 NOTE — Patient Instructions (Signed)
Nice to see you today I will be in touch with the labs and xray once I have the results Follow up with me in 3 months for a recheck, sooner if you need me

## 2023-03-09 NOTE — Assessment & Plan Note (Signed)
Patient with history of asthma states has been using her albuterol inhaler several times a day over the past 3 weeks without great relief.  She is having PND also.  Lungs clear to auscultation obtain chest x-ray place patient on prednisone taper for the next 6 days.  If she improves but then regresses she will need to be placed on a maintenance inhaler thereafter

## 2023-03-09 NOTE — Assessment & Plan Note (Signed)
Lymphadenopathy bilateral inferior anterior neck.  Ultrasound of neck placed patient given information to call and have scan set up at her convenience.

## 2023-03-09 NOTE — Progress Notes (Signed)
New Patient Office Visit  Subjective    Patient ID: Sharon Benson, female    DOB: 1976/01/13  Age: 47 y.o. MRN: [id number]  CC:  Chief Complaint  Patient presents with   Establish Care    HPI Sharon Benson presents to establish care  Asthma: states that she has been having to use the albuterol inhaler over the past 3 weeks several times without good relief. States that sh eis having PND. Worse with laying flat and side sleeping.  Thyroid cancer: last pet scan was 8 years. States that she did have covid and had no voice for four months. States that she had vertigo subsequently and was having falls. She is also having a crick in her neck. States that her voice is raspy that is intermittent since all this has happened.  Migraine: 2-3 times a month. Does not take meds. Tries to sleep to get it to abate. States that she has star like things in her vision prior to the headache. States that it is localized to the right side in the front of her head.  Last hours with Light sensitivity  Seizures: history of the same and last one was last year. Has been seen by neuro in the past and they cannot find anything per her report  GERD: states that she watches what she eats. States that it is worse with night time and will gag up some mucous in the morning. Every morning   Hypothyroidism/goiter: s/p thyroidectomy.  Patient currently maintained on levothyroxine 200 mcg daily along with Armour Thyroid 60 mg.  FHx of colon cancer: had a colonosocpy 10/25/2019, repeat in 5 years. Due 10/2024  Pap smear: followed by GYN and has mirena. States 2 years since last pap and does not think she has had any abnormal. [facility]  Mammogram: at [facility] office. Outpatient Encounter Medications as of 03/09/2023  Medication Sig   albuterol (PROVENTIL HFA;VENTOLIN HFA) 108 (90 Base) MCG/ACT inhaler Inhale 2 puffs into the lungs every 4 (four) hours as needed for wheezing or shortness of breath.    EPINEPHrine 0.3 mg/0.3 mL IJ SOAJ injection Inject 0.3 mg into the muscle as needed for anaphylaxis.   fluticasone (FLONASE) 50 MCG/ACT nasal spray Place 2 sprays into both nostrils daily.   ibuprofen (ADVIL) 800 MG tablet Take 1 tablet (800 mg total) by mouth every 8 (eight) hours as needed.   levocetirizine (XYZAL) 5 MG tablet Take 1 tablet (5 mg total) by mouth every evening.   levonorgestrel (MIRENA) 20 MCG/24HR IUD 1 each by Intrauterine route continuous.   levothyroxine (SYNTHROID) 200 MCG tablet Take 200 mcg by mouth daily before breakfast.   pantoprazole (PROTONIX) 20 MG tablet Take 1 tablet (20 mg total) by mouth daily.   predniSONE (DELTASONE) 20 MG tablet Take 1 tablet (20 mg total) by mouth 2 (two) times daily with a meal for 3 days, THEN 1 tablet (20 mg total) daily with breakfast for 3 days. Avoid NSAIDs like ibuprofen, aleve, naproxen, BC/Goody powders.   thyroid (ARMOUR) 60 MG tablet Take 60 mg by mouth daily before breakfast.   amoxicillin-clavulanate (AUGMENTIN) 875-125 MG tablet Take 1 tablet by mouth 2 (two) times daily.   doxycycline (MONODOX) 100 MG capsule Take 1 capsule (100 mg total) by mouth 2 (two) times daily. Take with food.   Liraglutide -Weight Management (SAXENDA) 18 MG/3ML SOPN Inject 1.8 mg into the skin daily.   montelukast (SINGULAIR) 10 MG tablet Take 10 mg by mouth daily. (  Patient not taking: Reported on 03/09/2023)   No facility-administered encounter medications on file as of 03/09/2023.    Past Medical History:  Diagnosis Date   Anxiety    panic attack - not recent 2001ish   Arthritis    Asthma    Cancer ([place])    thyroid cancer 2016   Dizziness    Endometriosis    Fibroid    GERD (gastroesophageal reflux disease)    Headache(784.0)    History of multiple miscarriages    Hypothyroidism    Insomnia    Palpitations    PCOS (polycystic ovarian syndrome)    PONV (postoperative nausea and vomiting)    last 2- no vomiting-  Seizures- not with  last 2.   Seizures ([place])    last seizure 04/2017   SVD (spontaneous vaginal delivery)    x 1   Vertigo    Vitamin D deficiency     Past Surgical History:  Procedure Laterality Date   BREAST REDUCTION SURGERY     BREAST SURGERY  1997   COLONOSCOPY     DILATION AND EVACUATION N/A 11/25/2013   Procedure: DILATATION AND EVACUATION;  Surgeon: Woodroe Mode, MD;  Location: [facility];  Service: Gynecology;  Laterality: N/A;   DILATION AND EVACUATION N/A 11/30/2013   Procedure: DILATATION AND EVACUATION;  Surgeon: Jonnie Kind, MD;  Location: [facility];  Service: Gynecology;  Laterality: N/A;   DILATION AND EVACUATION N/A 05/02/2014   Procedure: DILATATION AND EVACUATION;  Surgeon: Ena Dawley, MD;  Location: [facility];  Service: Gynecology;  Laterality: N/A;   GANGLION CYST EXCISION  1999   right hand   mirena     REDUCTION MAMMAPLASTY Bilateral    THYROIDECTOMY Bilateral 01/02/2015   Procedure: TOTAL THYROIDECTOMY;  Surgeon: Melissa Montane, MD;  [facility];  Service: ENT;  Laterality: Bilateral;   UPPER GI ENDOSCOPY      Family History  Problem Relation Age of Onset   Hypertension Father    Colon cancer Father        Dx at age 80 and it spread to his lungs, liver and bones   Lung cancer Maternal Grandmother    Colon cancer Maternal Grandmother    Cancer Paternal Grandmother        vaginal   Hypertension Paternal Grandmother    Diabetes Paternal Grandmother    Heart disease Paternal Grandmother    Cancer Other    Diabetes Other    High blood pressure Other    Esophageal cancer Neg Hx    Liver disease Neg Hx    Pancreatic cancer Neg Hx    Stomach cancer Neg Hx     Social History   Socioeconomic History   Marital status: Married    Spouse name: Josph Macho   Number of children: 4   Years of education: Not on file   Highest education level: Not on file  Occupational History   Occupation: [employer]  Tobacco Use   Smoking status: Former    Years: 2    Types: Cigarettes     Quit date: 04/28/1997    Years since quitting: 25.8   Smokeless tobacco: Never   Tobacco comments:    smoked in HS  Vaping Use   Vaping Use: Never used  Substance and Sexual Activity   Alcohol use: No   Drug use: No    Types: Marijuana    Comment: 18 yrs ago   Sexual activity: Yes    Partners: Male  Birth control/protection: I.U.D.  Other Topics Concern   Not on file  Social History Narrative   Lives with husband, child 2 yrs old   caffeine coffee ,tea      Tabourea P (28) stepdaughter   Shaquan (26) step son   Alexxus Brigitte Pulse (83) Bio sat   Ava Pittamin (5)   Social Determinants of Radio broadcast assistant Strain: Not on file  Food Insecurity: Not on file  Transportation Needs: Not on file  Physical Activity: Not on file  Stress: Not on file  Social Connections: Not on file  Intimate Partner Violence: Not on file    Review of Systems  Constitutional:  Negative for chills and fever.  Respiratory:  Negative for shortness of breath.   Cardiovascular:  Negative for chest pain.  Gastrointestinal:  Negative for abdominal pain, blood in stool, constipation, diarrhea, nausea and vomiting.       BM daily   Genitourinary:  Negative for dysuria and hematuria.  Neurological:  Positive for dizziness and headaches.  Psychiatric/Behavioral:  Negative for hallucinations and suicidal ideas.         Objective    BP 120/74   Pulse 80   Temp 98 F (36.7 C)   Resp 16   Ht 5\' 4"  (1.626 m)   Wt 273 lb 8 oz (124.1 kg)   SpO2 98%   BMI 46.95 kg/m   Physical Exam Vitals and nursing note reviewed.  Constitutional:      Appearance: Normal appearance. She is obese.  HENT:     Right Ear: Tympanic membrane, ear canal and external ear normal.     Left Ear: Ear canal and external ear normal.     Mouth/Throat:     Mouth: Mucous membranes are moist.     Pharynx: Oropharynx is clear.  Eyes:     Extraocular Movements: Extraocular movements intact.     Pupils: Pupils are  equal, round, and reactive to light.  Neck:   Cardiovascular:     Rate and Rhythm: Normal rate and regular rhythm.     Heart sounds: Normal heart sounds.  Pulmonary:     Effort: Pulmonary effort is normal.     Breath sounds: Normal breath sounds.  Musculoskeletal:     Right lower leg: No edema.     Left lower leg: No edema.  Lymphadenopathy:     Cervical: Cervical adenopathy present.  Neurological:     General: No focal deficit present.     Mental Status: She is alert.     Comments: Bilateral upper and lower extremity strength 5/5         Assessment & Plan:   Problem List Items Addressed This Visit       Cardiovascular and Mediastinum   Migraine headache    Patient has 2-3 migraines a month she will use over-the-counter analgesics and rest to get the go away.  Patient's never had prescription abortive therapy or prophylactic therapy per her report.        Respiratory   Mild persistent asthma without complication - Primary    Patient with history of asthma states has been using her albuterol inhaler several times a day over the past 3 weeks without great relief.  She is having PND also.  Lungs clear to auscultation obtain chest x-ray place patient on prednisone taper for the next 6 days.  If she improves but then regresses she will need to be placed on a maintenance inhaler thereafter  Relevant Medications   predniSONE (DELTASONE) 20 MG tablet   Other Relevant Orders   DG Chest 2 View     Digestive   Gastroesophageal reflux disease    Patient has had an endoscopy that did not reflect the need for PPI.  She does not take medicine.  Do think raspiness and coughing up stuff in the morning could be secondary to acid reflux will put patient on Protonix 20 mg daily for the next 3 months      Relevant Medications   pantoprazole (PROTONIX) 20 MG tablet     Endocrine   Hypothyroidism    Acquired hypothyroidism status post total thyroidectomy due to thyroid cancer.   Patient currently maintained on levothyroxine 200 mcg daily and Armour Thyroid 60 mg daily.  Pending TSH      Relevant Orders   TSH     Immune and Lymphatic   Lymphadenopathy    Lymphadenopathy bilateral inferior anterior neck.  Ultrasound of neck placed patient given information to call and have scan set up at her convenience.      Relevant Orders   US Soft Tissue Head/Neck (NON-THYROID)     Other   History of seizure    Patient states she has history of seizure has been evaluated by neurology but not placed on any antiepileptics per her report they states they cannot find anything.      Morbid obesity ([place])    Pending labs inclusive of TSH, A1c, lipid panel.      Relevant Orders   Lipid panel   Hemoglobin A1c   Shortness of breath    Ambiguous in nature.  Pending labs chest x-ray continue albuterol prednisone taper as prescribed      Relevant Medications   predniSONE (DELTASONE) 20 MG tablet   Other Relevant Orders   CBC   Comprehensive metabolic panel   DG Chest 2 View    Return in about 3 months (around 06/09/2023) for GERD/Asthma recheck .   Romilda Garret, NP

## 2023-03-09 NOTE — Assessment & Plan Note (Signed)
Pending labs inclusive of TSH, A1c, lipid panel.

## 2023-03-09 NOTE — Assessment & Plan Note (Signed)
Patient has 2-3 migraines a month she will use over-the-counter analgesics and rest to get the go away.  Patient's never had prescription abortive therapy or prophylactic therapy per her report.

## 2023-03-09 NOTE — Assessment & Plan Note (Signed)
Acquired hypothyroidism status post total thyroidectomy due to thyroid cancer.  Patient currently maintained on levothyroxine 200 mcg daily and Armour Thyroid 60 mg daily.  Pending TSH

## 2023-03-09 NOTE — Assessment & Plan Note (Signed)
Ambiguous in nature.  Pending labs chest x-ray continue albuterol prednisone taper as prescribed

## 2023-03-09 NOTE — Assessment & Plan Note (Signed)
Patient states she has history of seizure has been evaluated by neurology but not placed on any antiepileptics per her report they states they cannot find anything.

## 2023-03-11 ENCOUNTER — Encounter: Payer: Self-pay | Admitting: Nurse Practitioner

## 2023-03-11 DIAGNOSIS — E039 Hypothyroidism, unspecified: Secondary | ICD-10-CM

## 2023-03-14 MED ORDER — LEVOTHYROXINE SODIUM 112 MCG PO TABS: 112.0000 ug | ORAL_TABLET | Freq: Every day | ORAL | 3 refills | Status: DC

## 2023-03-14 NOTE — Telephone Encounter (Signed)
-----   Message from [place], [place] sent at 03/13/2023  2:30 PM EDT ----- Pt states she is only taking 75 mcg of Levothyroxine and 2 x 60 mg Armour in the am. Pt also said she has a lot of inflammation and wants to know what to take to help with it.

## 2023-03-14 NOTE — Telephone Encounter (Signed)
Called patient reviewed information. She has lab appointment scheduled for 8 weeks. Will call if any questions/problems.

## 2023-03-14 NOTE — Telephone Encounter (Signed)
We will need to increase the levothyroxine and recheck labs in 8 weeks. We are going to increase from 66mcg to 15mcg. New rx sent in. She can continue to take the armour thyroid as prescribed

## 2023-03-20 ENCOUNTER — Ambulatory Visit
Admission: RE | Admit: 2023-03-20 | Discharge: 2023-03-20 | Disposition: A | Discharge status: Home / Self Care | Payer: Medicaid Other | Source: Ambulatory Visit | Attending: Nurse Practitioner | Admitting: Nurse Practitioner

## 2023-03-20 DIAGNOSIS — R591 Generalized enlarged lymph nodes: Secondary | ICD-10-CM | POA: Diagnosis not present

## 2023-03-23 ENCOUNTER — Telehealth: Payer: Self-pay | Admitting: Nurse Practitioner

## 2023-03-23 ENCOUNTER — Encounter (HOSPITAL_COMMUNITY): Payer: Self-pay

## 2023-03-23 ENCOUNTER — Emergency Department (HOSPITAL_COMMUNITY): Payer: 59

## 2023-03-23 ENCOUNTER — Emergency Department (HOSPITAL_COMMUNITY)
Admission: EM | Admit: 2023-03-23 | Discharge: 2023-03-24 | Disposition: A | Discharge status: Home / Self Care | Payer: 59 | Attending: Emergency Medicine | Admitting: Emergency Medicine

## 2023-03-23 DIAGNOSIS — Z79899 Other long term (current) drug therapy: Secondary | ICD-10-CM | POA: Insufficient documentation

## 2023-03-23 DIAGNOSIS — R0789 Other chest pain: Secondary | ICD-10-CM | POA: Insufficient documentation

## 2023-03-23 DIAGNOSIS — E0590 Thyrotoxicosis, unspecified without thyrotoxic crisis or storm: Secondary | ICD-10-CM

## 2023-03-23 DIAGNOSIS — R42 Dizziness and giddiness: Secondary | ICD-10-CM | POA: Insufficient documentation

## 2023-03-23 DIAGNOSIS — R002 Palpitations: Secondary | ICD-10-CM | POA: Insufficient documentation

## 2023-03-23 DIAGNOSIS — Z20822 Contact with and (suspected) exposure to covid-19: Secondary | ICD-10-CM | POA: Insufficient documentation

## 2023-03-23 DIAGNOSIS — E039 Hypothyroidism, unspecified: Secondary | ICD-10-CM | POA: Insufficient documentation

## 2023-03-23 DIAGNOSIS — R0602 Shortness of breath: Secondary | ICD-10-CM | POA: Insufficient documentation

## 2023-03-23 LAB — CBC WITH DIFFERENTIAL/PLATELET
Abs Immature Granulocytes: 0.03 10*3/uL (ref 0.00–0.07)
Basophils Absolute: 0 10*3/uL (ref 0.0–0.1)
Basophils Relative: 0 %
Eosinophils Absolute: 0 10*3/uL (ref 0.0–0.5)
Eosinophils Relative: 0 %
HCT: 39.6 % (ref 36.0–46.0)
Hemoglobin: 12.9 g/dL (ref 12.0–15.0)
Immature Granulocytes: 1 %
Lymphocytes Relative: 34 %
Lymphs Abs: 1.9 10*3/uL (ref 0.7–4.0)
MCH: 28.7 pg (ref 26.0–34.0)
MCHC: 32.6 g/dL (ref 30.0–36.0)
MCV: 88 fL (ref 80.0–100.0)
Monocytes Absolute: 0.4 10*3/uL (ref 0.1–1.0)
Monocytes Relative: 6 %
Neutro Abs: 3.3 10*3/uL (ref 1.7–7.7)
Neutrophils Relative %: 59 %
Platelets: 214 10*3/uL (ref 150–400)
RBC: 4.5 MIL/uL (ref 3.87–5.11)
RDW: 13.9 % (ref 11.5–15.5)
WBC: 5.6 10*3/uL (ref 4.0–10.5)
nRBC: 0 % (ref 0.0–0.2)

## 2023-03-23 LAB — BASIC METABOLIC PANEL
Anion gap: 10 (ref 5–15)
BUN: 15 mg/dL (ref 6–20)
CO2: 26 mmol/L (ref 22–32)
Calcium: 9.6 mg/dL (ref 8.9–10.3)
Chloride: 100 mmol/L (ref 98–111)
Creatinine, Ser: 0.87 mg/dL (ref 0.44–1.00)
GFR, Estimated: >60 mL/min (ref >60)
Glucose, Bld: 97 mg/dL (ref 70–99)
Potassium: 3.8 mmol/L (ref 3.5–5.1)
Sodium: 136 mmol/L (ref 135–145)

## 2023-03-23 LAB — BRAIN NATRIURETIC PEPTIDE: B Natriuretic Peptide: 11.8 pg/mL (ref 0.0–100.0)

## 2023-03-23 LAB — TSH: TSH: 0.373 u[IU]/mL (ref 0.350–4.500)

## 2023-03-23 NOTE — ED Provider Triage Note (Signed)
Emergency Medicine Provider Triage Evaluation Note  Sharon Benson , a 47 y.o. female  was evaluated in triage.  Pt complains of palpitation. Having chest pain, sob, heart palpitation x 2 days.  Has thyroid cancer with thyroidectomy.  Have been compliant with her meds.   Review of Systems  Positive: As above Negative: As above  Physical Exam  BP (!) 152/102 (BP Location: Right Arm)   Pulse (!) 102   Temp 98.5 F (36.9 C) (Oral)   Resp 18   Wt 122.5 kg   SpO2 97%   BMI 46.35 kg/m  Gen:   Awake, no distress   Resp:  Normal effort  MSK:   Moves extremities without difficulty  Other:    Medical Decision Making  Medically screening exam initiated at 3:52 PM.  Appropriate orders placed.  Sharon Benson was informed that the remainder of the evaluation will be completed by another provider, this initial triage assessment does not replace that evaluation, and the importance of remaining in the ED until their evaluation is complete.     Fayrene Helper, PA-C 03/23/23 1555

## 2023-03-23 NOTE — Telephone Encounter (Signed)
FYI: This call has been transferred to Access Nurse. Once the result note has been entered staff can address the message at that time.  Patient called in with the following symptoms:  Red Word: Fast heartbeat over past couple days, highest 135 resting, has SOB when heart rate picks up. Patient says she can feel her heart beating fast.   Please advise at Mobile [phone] (mobile)  Message is routed to Provider Pool and [facility] Triage

## 2023-03-23 NOTE — ED Triage Notes (Signed)
Pt c/o sob, palpitations x 2 days, worse today; reported highest HR 138; endorses upper back pain, central, under shoulder blades for past 40 minutes; denies nausea; denies fevers; sent by dr for further evaluation

## 2023-03-23 NOTE — Telephone Encounter (Signed)
Per chart review tab pt is at [facility] ED now; sending note to Audria Nine NP and [place] pool.    [facility] - Client TELEPHONE ADVICE RECORD AccessNurse Patient Name: Sharon Benson Gender: Female DOB: 07-Jan-1976 Age: 47 Y 9 M 6 D Return Phone Number: [phone] (Primary) Address: City/ State/ Zip: [place] Client [facility] - Client Client Site  [facility] - Day Provider Audria Nine- NP Contact Type Call Who Is Calling Patient / Member / Family / Caregiver Call Type Triage / Clinical Relationship To Patient Self Return Phone Number [phone] (Primary) Chief Complaint Heart palpitations or irregular heartbeat Reason for Call Symptomatic / Request for Health Information Initial Comment Caller states a pts heartbeat was 135. When her heartrate is high she has shortness of breath. Translation No Nurse Assessment Nurse: Carylon Perches, RN, Hilda Lias Date/Time Lamount Cohen Time): 03/23/2023 2:46:03 PM Confirm and document reason for call. If symptomatic, describe symptoms. ---Caller states a pts heartbeat was 135 at rest. When her heartrate is high she has shortness of breath. Heart is pounding, its makes her shirt move. Does the patient have any new or worsening symptoms? ---Yes Will a triage be completed? ---Yes Related visit to physician within the last 2 weeks? ---No Does the PT have any chronic conditions? (i.e. diabetes, asthma, this includes High risk factors for pregnancy, etc.) ---Yes List chronic conditions. ---hypothyroidism Is the patient pregnant or possibly pregnant? (Ask all females between the ages of 39-55) ---No Is this a behavioral health or substance abuse call? ---No Guidelines Guideline Title Affirmed Question Affirmed Notes Nurse Date/Time Lamount Cohen Time) Heart Rate and Heartbeat Questions Difficulty breathing Mordecai Maes 03/23/2023 2:47:52 PM Disp. Time Lamount Cohen Time)  Disposition Final User 03/23/2023 2:50:54 PM Go to ED Now Yes Carylon Perches, RN, Saul Fordyce NOTE: All timestamps contained within this report are represented as [place] Standard Time. CONFIDENTIALTY NOTICE: This fax transmission is intended only for the addressee. It contains information that is legally privileged, confidential or otherwise protected from use or disclosure. If you are not the intended recipient, you are strictly prohibited from reviewing, disclosing, copying using or disseminating any of this information or taking any action in reliance on or regarding this information. If you have received this fax in error, please notify us immediately by telephone so that we can arrange for its return to [name]. Phone: [phone], Toll-Free: [phone], Fax: [phone] Page: 2 of 2 Call Id: [id number] Final Disposition 03/23/2023 2:50:54 PM Go to ED Now Yes Carylon Perches, RN, [place] Grater Disagree/Comply Comply Caller Understands Yes PreDisposition Call Doctor Care Advice Given Per Guideline GO TO ED NOW: * You need to be seen in the Emergency Department. * Leave now. Drive carefully. ANOTHER ADULT SHOULD DRIVE: * It is better and safer if another adult drives instead of you. CARE ADVICE given per Heart Rate and Heartbeat Questions (Adult) guideline. Referrals [facility] - [facility]

## 2023-03-23 NOTE — Telephone Encounter (Signed)
Noted. Will follow along with ED notes

## 2023-03-24 ENCOUNTER — Emergency Department (HOSPITAL_COMMUNITY): Payer: 59

## 2023-03-24 ENCOUNTER — Other Ambulatory Visit: Payer: Self-pay

## 2023-03-24 ENCOUNTER — Telehealth: Payer: Self-pay

## 2023-03-24 LAB — D-DIMER, QUANTITATIVE: D-Dimer, Quant: 0.67 ug/mL-FEU — ABNORMAL HIGH (ref 0.00–0.50)

## 2023-03-24 LAB — TROPONIN I (HIGH SENSITIVITY)
Troponin I (High Sensitivity): <2 ng/L (ref <18)
Troponin I (High Sensitivity): <2 ng/L (ref <18)

## 2023-03-24 LAB — T4, FREE: Free T4: 1.41 ng/dL — ABNORMAL HIGH (ref 0.61–1.12)

## 2023-03-24 MED ORDER — IOHEXOL 350 MG/ML SOLN
75.0000 mL | Freq: Once | INTRAVENOUS | Status: AC | PRN
  Administered 2023-03-24: 75 mL via INTRAVENOUS

## 2023-03-24 MED ORDER — METOPROLOL TARTRATE 25 MG PO TABS: 12.5000 mg | ORAL_TABLET | Freq: Two times a day (BID) | ORAL | 0 refills | Status: DC

## 2023-03-24 NOTE — Discharge Instructions (Signed)
Your testing today shows no evidence of heart attack or blood clot in the lung.  Your TSH is normal but free T4 is slightly elevated.  Your doctor may wish to decrease your Armour Thyroid dose.  You are started on a low-dose medicine called Lopressor to help with your palpitations.  This may make you dizzy or lightheaded and if so he should stop taking it.  Follow-up with your doctor for a recheck this week.  Cardiology clinic should call you to schedule an appointment to wear a monitor at home.  Return to the ED with exertional chest pain, pain associate with shortness of breath, nausea, vomiting, sweating or other concerns.

## 2023-03-24 NOTE — Transitions of Care (Post Inpatient/ED Visit) (Unsigned)
   03/24/2023  Name: Sharon Benson MRN: [id number] DOB: 28-May-1976  Today's TOC FU Call Status: Today's TOC FU Call Status:: Successful TOC FU Call Competed TOC FU Call Complete Date: 03/24/23  Transition Care Management Follow-up Telephone Call Date of Discharge: 03/23/23 Discharge [facility]) Type of Discharge: Emergency Department Reason for ED Visit: Cardiac Conditions Cardiac Conditions Diagnosis:  (Palpitations) How have you been since you were released from the hospital?: Same Any questions or concerns?: No  Items Reviewed: Did you receive and understand the discharge instructions provided?: Yes Medications obtained and verified?: Yes (Medications Reviewed) Any new allergies since your discharge?: No Dietary orders reviewed?: NA Do you have support at home?: Yes People in Home: spouse Name of Support/Comfort Primary Source: [facility] and Equipment/Supplies: Were Home Health Services Ordered?: NA Any new equipment or medical supplies ordered?: NA  Functional Questionnaire: Do you need assistance with bathing/showering or dressing?: No Do you need assistance with meal preparation?: No Do you need assistance with eating?: No Do you have difficulty maintaining continence: No Do you need assistance with getting out of bed/getting out of a chair/moving?: No Do you have difficulty managing or taking your medications?: No  Follow up appointments reviewed: PCP Follow-up appointment confirmed?: Yes Date of PCP follow-up appointment?: 03/28/23 Follow-up Provider: Audria Nine, NP Specialist Hospital Follow-up appointment confirmed?: No Reason Specialist Follow-Up Not Confirmed: Patient has Specialist Provider Number and will Call for Appointment Do you need transportation to your follow-up appointment?: No Do you understand care options if your condition(s) worsen?: Yes-patient verbalized understanding    SIGNATURE Donnamarie Poag, CMA

## 2023-03-24 NOTE — ED Provider Notes (Signed)
[place] EMERGENCY DEPARTMENT AT [facility] Provider Note   CSN: [id number] Arrival date & time: 03/23/23  1528     History  No chief complaint on file.   Sharon Benson is a 47 y.o. female.  Patient with hypothyroidism, endometriosis, palpitations, anxiety, GERD presenting with palpitations and shortness of breath intermittently for the past 2 days.  States she feels her heart racing up and down at times.  Has measured her heartbeat between 90s to 138.  When the heart races she feels lightheaded and feels short of breath.  She has noticed intermittent "pinching" in the center of her chest lasting for several seconds at a time that radiates to her mid back.  Pinching lasts a few seconds to few minutes and comes and goes.  Palpitations and shortness of breath come and go and are associated with lightheadedness but no room spinning dizziness.  Intermittent headaches but no significant headache currently.  No chest pain currently.  No abdominal pain, nausea or vomiting.  No fever, runny nose or sore throat.  No pain with urination or blood in the urine.  States her Synthroid dose was increased 2 weeks ago.  No excessive caffeine intake.  No room spinning dizziness.  No focal weakness, numbness or tingling.  No difficulty speaking or difficulty swallowing.  The history is provided by the patient.       Home Medications Prior to Admission medications   Medication Sig Start Date End Date Taking? Authorizing Provider  albuterol (PROVENTIL HFA;VENTOLIN HFA) 108 (90 Base) MCG/ACT inhaler Inhale 2 puffs into the lungs every 4 (four) hours as needed for wheezing or shortness of breath. 12/10/16   Muthersbaugh, Dahlia Client, PA-C  EPINEPHrine 0.3 mg/0.3 mL IJ SOAJ injection Inject 0.3 mg into the muscle as needed for anaphylaxis. 08/11/21   [provider]  fluticasone (FLONASE) 50 MCG/ACT nasal spray Place 2 sprays into both nostrils daily. 04/21/22   Waldon Merl, PA-C   ibuprofen (ADVIL) 800 MG tablet Take 1 tablet (800 mg total) by mouth every 8 (eight) hours as needed. 05/29/22   Fisher, Roselyn Bering, PA-C  levocetirizine (XYZAL) 5 MG tablet Take 1 tablet (5 mg total) by mouth every evening. 04/21/22   Waldon Merl, PA-C  levonorgestrel [facility]) 20 MCG/24HR IUD 1 each by Intrauterine route continuous.    [provider]  levothyroxine (SYNTHROID) 112 MCG tablet Take 1 tablet (112 mcg total) by mouth daily. 03/14/23   Eden Emms, NP  montelukast (SINGULAIR) 10 MG tablet Take 10 mg by mouth daily. Patient not taking: Reported on 03/09/2023 03/28/22   [provider]  pantoprazole (PROTONIX) 20 MG tablet Take 1 tablet (20 mg total) by mouth daily. 03/09/23   Eden Emms, NP  thyroid (ARMOUR) 60 MG tablet Take 60 mg by mouth daily before breakfast.    [provider]      Allergies    Other, Cocoa butter, and Codeine    Review of Systems   Review of Systems  Constitutional:  Positive for fatigue. Negative for activity change, appetite change and fever.  HENT:  Negative for congestion and rhinorrhea.   Respiratory:  Positive for chest tightness and shortness of breath. Negative for cough.   Cardiovascular:  Positive for palpitations.  Gastrointestinal:  Negative for abdominal pain, diarrhea and vomiting.  Genitourinary:  Negative for dysuria and hematuria.  Musculoskeletal:  Negative for arthralgias and myalgias.  Skin:  Negative for rash.  Neurological:  Positive for light-headedness.  Negative for dizziness, weakness and headaches.   all other systems are negative except as noted in the HPI and PMH.    Physical Exam Updated Vital Signs BP 117/78   Pulse 94   Temp 98.7 F (37.1 C) (Oral)   Resp 18   Wt 122.5 kg   SpO2 100%   BMI 46.35 kg/m  Physical Exam Vitals and nursing note reviewed.  Constitutional:      General: She is not in acute distress.    Appearance: She is well-developed.  HENT:     Head:  Normocephalic and atraumatic.     Mouth/Throat:     Pharynx: No oropharyngeal exudate.  Eyes:     Conjunctiva/sclera: Conjunctivae normal.     Pupils: Pupils are equal, round, and reactive to light.  Neck:     Comments: No meningismus. Cardiovascular:     Rate and Rhythm: Normal rate and regular rhythm.     Heart sounds: Normal heart sounds. No murmur heard. Pulmonary:     Effort: Pulmonary effort is normal. No respiratory distress.     Breath sounds: Normal breath sounds.  Abdominal:     Palpations: Abdomen is soft.     Tenderness: There is no abdominal tenderness. There is no guarding or rebound.  Musculoskeletal:        General: No tenderness. Normal range of motion.     Cervical back: Normal range of motion and neck supple.  Skin:    General: Skin is warm.  Neurological:     Mental Status: She is alert and oriented to person, place, and time.     Cranial Nerves: No cranial nerve deficit.     Motor: No abnormal muscle tone.     Coordination: Coordination normal.     Comments:  5/5 strength throughout. CN 2-12 intact.Equal grip strength.   Psychiatric:        Behavior: Behavior normal.     ED Results / Procedures / Treatments   Labs (all labs ordered are listed, but only abnormal results are displayed) Labs Reviewed  D-DIMER, QUANTITATIVE - Abnormal; Notable for the following components:      Result Value   D-Dimer, Quant 0.67 (*)    All other components within normal limits  T4, FREE - Abnormal; Notable for the following components:   Free T4 1.41 (*)    All other components within normal limits  BASIC METABOLIC PANEL  CBC WITH DIFFERENTIAL/PLATELET  BRAIN NATRIURETIC PEPTIDE  TSH  TROPONIN I (HIGH SENSITIVITY)  TROPONIN I (HIGH SENSITIVITY)    EKG EKG Interpretation  Date/Time:  Thursday March 23 2023 15:49:34 EDT Ventricular Rate:  90 PR Interval:  144 QRS Duration: 86 QT Interval:  346 QTC Calculation: 423 R Axis:   69 Text Interpretation: Normal  sinus rhythm Normal ECG When compared with ECG of 23-Nov-2021 20:01, PREVIOUS ECG IS PRESENT No significant change was found Confirmed by Glynn Octave [phone]) on 03/23/2023 11:49:40 PM  Radiology CT Angio Chest PE W and/or Wo Contrast  Result Date: 03/24/2023 CLINICAL DATA:  Pulmonary embolism (PE) suspected, low to intermediate prob, positive D-dimer. Palpitations EXAM: CT ANGIOGRAPHY CHEST WITH CONTRAST TECHNIQUE: Multidetector CT imaging of the chest was performed using the standard protocol during bolus administration of intravenous contrast. Multiplanar CT image reconstructions and MIPs were obtained to evaluate the vascular anatomy. RADIATION DOSE REDUCTION: This exam was performed according to the departmental dose-optimization program which includes automated exposure control, adjustment of the mA and/or kV according to patient size and/or  use of iterative reconstruction technique. CONTRAST:  75mL OMNIPAQUE IOHEXOL 350 MG/ML SOLN COMPARISON:  11/24/2021 FINDINGS: Cardiovascular: No filling defects in the pulmonary arteries to suggest pulmonary emboli. Heart is normal size. Aorta is normal caliber. Mediastinum/Nodes: No mediastinal, hilar, or axillary adenopathy. Trachea and esophagus are unremarkable. Thyroid unremarkable. Lungs/Pleura: Lungs are clear. No focal airspace opacities or suspicious nodules. No effusions. Upper Abdomen: No acute findings Musculoskeletal: Chest wall soft tissues are unremarkable. No acute bony abnormality. Review of the MIP images confirms the above findings. IMPRESSION: No evidence of pulmonary embolus. No acute cardiopulmonary disease. Electronically Signed   By: Charlett Nose M.D.   On: 03/24/2023 01:51   DG Chest 2 View  Result Date: 03/23/2023 CLINICAL DATA:  Shortness of breath EXAM: CHEST - 2 VIEW COMPARISON:  03/09/2023 FINDINGS: Unchanged cardiomediastinal silhouette. There is no focal airspace consolidation. There is no pleural effusion or evidence of  pneumothorax. There is no acute osseous abnormality. IMPRESSION: No evidence of acute cardiopulmonary disease. Electronically Signed   By: Caprice Renshaw M.D.   On: 03/23/2023 16:36    Procedures Procedures    Medications Ordered in ED Medications - No data to display  ED Course/ Medical Decision Making/ A&P                             Medical Decision Making Amount and/or Complexity of Data Reviewed Labs: ordered. Decision-making details documented in ED Course. Radiology: ordered and independent interpretation performed. Decision-making details documented in ED Course. ECG/medicine tests: ordered and independent interpretation performed. Decision-making details documented in ED Course.  Risk Prescription drug management.   Intermittent palpitations for the past 2 days associate with shortness of breath and chest.  EKG is sinus rhythm, no acute ST changes.  Will check orthostatics, serial troponins and D-dimer.  EKG shows no acute ischemia.  TSH is normal.  Orthostatics are negative.  D-dimer slightly elevated.  Troponin is negative.  Subsequent CT scan shows no evidence of pulmonary embolism.  Results reviewed interpreted by me.  TSH is normal, free T4 slightly elevated 1.4.  Discussed she may need adjustments of her thyroid medications by her PCP.  Will initiate low-dose beta-blocker given her palpitations.  Low suspicion for ACS or pulmonary embolism.  No evidence of thyroid storm.  Negative troponin x 2.  Low suspicion for ACS.  Will refer to cardiology for consideration of Holter monitor.  Follow-up with PCP regarding adjustment of her thyroid medication.  Patient agrees to initiate low-dose beta-blocker.  Cautioned that she could become lightheaded and dizzy with this medication and she should stop it if, that is the case.  Return to the ED sooner with difficulty breathing, exertional chest pain, pain associate with shortness of breath, nausea, vomiting, sweating or other  concerns.        Final Clinical Impression(s) / ED Diagnoses Final diagnoses:  None    Rx / DC Orders ED Discharge Orders     None         Zannie Runkle, Jeannett Senior, MD 03/24/23 [id number]

## 2023-03-28 ENCOUNTER — Ambulatory Visit: Payer: PRIVATE HEALTH INSURANCE | Admitting: Nurse Practitioner

## 2023-03-28 ENCOUNTER — Encounter: Payer: Self-pay | Admitting: Nurse Practitioner

## 2023-03-28 VITALS — BP 110/74 | HR 70 | Temp 98.2°F | Resp 16 | Ht 64.0 in | Wt 271.5 lb

## 2023-03-28 DIAGNOSIS — R000 Tachycardia, unspecified: Secondary | ICD-10-CM | POA: Diagnosis not present

## 2023-03-28 DIAGNOSIS — E039 Hypothyroidism, unspecified: Secondary | ICD-10-CM | POA: Diagnosis not present

## 2023-03-28 MED ORDER — LEVOTHYROXINE SODIUM 100 MCG PO TABS
100.0000 ug | ORAL_TABLET | Freq: Every day | ORAL | 0 refills | Status: DC
Start: 2023-03-28 — End: 2023-04-19

## 2023-03-28 NOTE — Patient Instructions (Addendum)
Nice to see you today  I have changed the levothyroxine to daily. If the heart rate is 120s or higher consistently take the metoprolol medications as prescribed. Keep your lab appointment as scheduled

## 2023-03-28 NOTE — Assessment & Plan Note (Signed)
Patient currently maintained on Np thyroid  daily and was on levothyroxine daily. Had a really good response and her T4 was elevated in the ED. We will decrease the levothyroxine to daily.

## 2023-03-28 NOTE — Progress Notes (Signed)
Established Patient Office Visit  Subjective   Patient ID: Sharon Benson, female    DOB: 05/17/1976  Age: 47 y.o. MRN: [id number]  Chief Complaint  Patient presents with   Follow-up    HPI  Hospital follow up: Patient was seen in the emergency department on 03/23/2023 for palpitations and possible him.  Patient was recently seen by me on 03/09/2023 and TSH was performed and came back at 76.67.  Patient was currently maintained on Armour Thyroid 120 mg and 75 mcg levothyroxine.  I did increase levothyroxine from 75 mcg to 112 mcg.  Of note T4 was elevated in the emergency department at 1.41 and TSH was on the lower end of normal.  Patient was placed on a low-dose beta-blocker to help with palpitations.  She is here for follow-up.  States that she is still having a racing heart. States that she can be sitting or doing activity. States walking in it was 115 has been as high as 138. States that she did not go get the medication that was prescribed by the ED as she wanted to check with me first.   States that she has seen an endocrine in the past and they have had trouble getting her thyroid regulated      Review of Systems  Constitutional:  Negative for chills and fever.  Respiratory:  Positive for shortness of breath (with heart rate).   Cardiovascular:  Positive for palpitations. Negative for chest pain (pinch).  Gastrointestinal:  Negative for constipation and diarrhea.  Neurological:  Positive for headaches (improved first and second days). Negative for dizziness.      Objective:     BP 110/74   Pulse 70   Temp 98.2 F (36.8 C)   Resp 16   Ht  (1.626 m)   Wt 271 lb 8 oz (123.2 kg)   SpO2 98%   BMI 46.60 kg/m  BP Readings from Last 3 Encounters:  03/28/23 110/74  03/24/23 116/75  03/09/23 120/74   Wt Readings from Last 3 Encounters:  03/28/23 271 lb 8 oz (123.2 kg)  03/24/23 269 lb 13.5 oz (122.4 kg)  03/09/23 273 lb 8 oz (124.1 kg)      Physical  Exam Vitals and nursing note reviewed.  Constitutional:      Appearance: Normal appearance.  Cardiovascular:     Rate and Rhythm: Normal rate and regular rhythm.     Heart sounds: Normal heart sounds.  Pulmonary:     Effort: Pulmonary effort is normal.     Breath sounds: Normal breath sounds.  Musculoskeletal:     Right lower leg: No edema.     Left lower leg: No edema.  Neurological:     Mental Status: She is alert.      No results found for any visits on 03/28/23.    The 10-year ASCVD risk score (Arnett DK, et al., 2019) is: 0.4%    Assessment & Plan:   Problem List Items Addressed This Visit       Endocrine   Hypothyroidism    Patient currently maintained on Np thyroid  daily and was on levothyroxine daily. Had a really good response and her T4 was elevated in the ED. We will decrease the levothyroxine to daily.       Relevant Medications   levothyroxine (SYNTHROID) 100 MCG tablet     Other   Tachycardia - Primary    Still having episodes. Her smart watch will let her  know. States she will get [facility] with tacycardia also. She will hold off on the lopressor currently. She was given parameters as when to start it. Told her it would likely take about 5 days for the level fo levothyroxine to drop. F/u as scheduled. Sooner if needed       Return if symptoms worsen or fail to improve, for As scheduled .    Audria Nine, NP

## 2023-03-28 NOTE — Assessment & Plan Note (Signed)
Still having episodes. Her smart watch will let her know. States she will get [facility] with tacycardia also. She will hold off on the lopressor currently. She was given parameters as when to start it. Told her it would likely take about 5 days for the level fo levothyroxine to drop. F/u as scheduled. Sooner if needed

## 2023-04-19 ENCOUNTER — Other Ambulatory Visit: Payer: Self-pay | Admitting: Nurse Practitioner

## 2023-04-19 DIAGNOSIS — E039 Hypothyroidism, unspecified: Secondary | ICD-10-CM

## 2023-04-27 ENCOUNTER — Other Ambulatory Visit: Payer: Self-pay | Admitting: Nurse Practitioner

## 2023-04-27 DIAGNOSIS — E039 Hypothyroidism, unspecified: Secondary | ICD-10-CM

## 2023-04-28 ENCOUNTER — Encounter: Payer: Self-pay | Admitting: Nurse Practitioner

## 2023-04-28 DIAGNOSIS — K219 Gastro-esophageal reflux disease without esophagitis: Secondary | ICD-10-CM

## 2023-05-03 ENCOUNTER — Other Ambulatory Visit: Payer: Self-pay | Admitting: Nurse Practitioner

## 2023-05-03 DIAGNOSIS — E039 Hypothyroidism, unspecified: Secondary | ICD-10-CM

## 2023-05-09 ENCOUNTER — Encounter: Payer: Self-pay | Admitting: Cardiovascular Disease

## 2023-05-09 ENCOUNTER — Other Ambulatory Visit (INDEPENDENT_AMBULATORY_CARE_PROVIDER_SITE_OTHER): Payer: PRIVATE HEALTH INSURANCE

## 2023-05-09 ENCOUNTER — Ambulatory Visit: Payer: PRIVATE HEALTH INSURANCE | Attending: Cardiovascular Disease

## 2023-05-09 ENCOUNTER — Ambulatory Visit: Payer: PRIVATE HEALTH INSURANCE | Attending: Cardiovascular Disease | Admitting: Cardiovascular Disease

## 2023-05-09 VITALS — BP 122/78 | HR 87 | Ht 64.0 in | Wt 271.6 lb

## 2023-05-09 DIAGNOSIS — R002 Palpitations: Secondary | ICD-10-CM

## 2023-05-09 DIAGNOSIS — E039 Hypothyroidism, unspecified: Secondary | ICD-10-CM | POA: Diagnosis not present

## 2023-05-09 NOTE — Progress Notes (Signed)
05/09/2023 Sharon Benson   July 28, 1976  [id number]  Primary Physician Toney Reil Genene Churn, NP Primary Cardiologist: Runell Gess MD Nicholes Calamity, [place]  HPI:  Sharon Benson is a 47 y.o. morbidly overweight married African-American female mother of 4 children, grandmother of 1 grandchild who works as a Associate Professor.  She was referred by the emergency room, Dr. Manus Gunning , after recently presenting with palpitations on 03/24/2023.  Her workup was unrevealing.  A CTA of the chest showed no pulmonary emboli or calcification.  She does get short of breath when she gets these palpitations.  She has no cardiac risk factors.   Current Meds  Medication Sig   albuterol (PROVENTIL HFA;VENTOLIN HFA) 108 (90 Base) MCG/ACT inhaler Inhale 2 puffs into the lungs every 4 (four) hours as needed for wheezing or shortness of breath.   budesonide-formoterol (SYMBICORT) 80-4.5 MCG/ACT inhaler 2 puffs Inhalation twice a day for 30 days   cetirizine (ZYRTEC) 10 MG tablet Oral   EPINEPHrine 0.3 mg/0.3 mL IJ SOAJ injection Inject 0.3 mg into the muscle as needed for anaphylaxis.   fluticasone (FLONASE) 50 MCG/ACT nasal spray Place 2 sprays into both nostrils daily.   ibuprofen (ADVIL) 800 MG tablet Take 1 tablet (800 mg total) by mouth every 8 (eight) hours as needed.   levocetirizine (XYZAL) 5 MG tablet Take 1 tablet (5 mg total) by mouth every evening.   levonorgestrel (MIRENA) 20 MCG/24HR IUD 1 each by Intrauterine route continuous.   levothyroxine (SYNTHROID) 100 MCG tablet TAKE 1 TABLET BY MOUTH EVERY DAY   pantoprazole (PROTONIX) 20 MG tablet Take 1 tablet (20 mg total) by mouth daily.   thyroid (ARMOUR) 60 MG tablet Take 60 mg by mouth daily before breakfast.     Allergies  Allergen Reactions   Other Anaphylaxis and Itching    All melons  Makes her tongue swell   Cocoa Butter Hives   Codeine Other (See Comments)    seizures    Social History   Socioeconomic History   Marital  status: Married    Spouse name: Merlyn Albert   Number of children: 4   Years of education: Not on file   Highest education level: Not on file  Occupational History   Occupation: [employer]  Tobacco Use   Smoking status: Former    Years: 2    Types: Cigarettes    Quit date: 04/28/1997    Years since quitting: 26.0   Smokeless tobacco: Never   Tobacco comments:    smoked in HS  Vaping Use   Vaping Use: Never used  Substance and Sexual Activity   Alcohol use: No   Drug use: No    Types: Marijuana    Comment: 18 yrs ago   Sexual activity: Yes    Partners: Male    Birth control/protection: I.U.D.  Other Topics Concern   Not on file  Social History Narrative   Lives with husband, child 2 yrs old   caffeine coffee ,tea      Tabourea P (28) stepdaughter   Shaquan (26) step son   Alexxus Clelia Croft (24) Bio sat   Ava Pittamin (5)   Social Determinants of Corporate investment banker Strain: Not on file  Food Insecurity: Not on file  Transportation Needs: Not on file  Physical Activity: Not on file  Stress: Not on file  Social Connections: Not on file  Intimate Partner Violence: Not on file     Review of Systems: General:  negative for chills, fever, night sweats or weight changes.  Cardiovascular: negative for chest pain, dyspnea on exertion, edema, orthopnea, palpitations, paroxysmal nocturnal dyspnea or shortness of breath Dermatological: negative for rash Respiratory: negative for cough or wheezing Urologic: negative for hematuria Abdominal: negative for nausea, vomiting, diarrhea, bright red blood per rectum, melena, or hematemesis Neurologic: negative for visual changes, syncope, or dizziness All other systems reviewed and are otherwise negative except as noted above.    Blood pressure 122/78, pulse 87, height 5\' 4"  (1.626 m), weight 271 lb 9.6 oz (123.2 kg), SpO2 98 %, unknown if currently breastfeeding.  General appearance: alert and no distress Neck: no adenopathy, no carotid  bruit, no JVD, supple, symmetrical, trachea midline, and thyroid not enlarged, symmetric, no tenderness/mass/nodules Lungs: clear to auscultation bilaterally Heart: regular rate and rhythm, S1, S2 normal, no murmur, click, rub or gallop Extremities: extremities normal, atraumatic, no cyanosis or edema Pulses: 2+ and symmetric Skin: Skin color, texture, turgor normal. No rashes or lesions Neurologic: Grossly normal  EKG sinus rhythm at 87 with RSR prime in lead V1 consistent with an RV conduction delay.  I personally reviewed this EKG.  ASSESSMENT AND PLAN:   Palpitations Ms. Danehy was referred to me by the emergency room because of palpitations.  She has seen Dr. Elease Hashimoto back in 2016 for palpitations.  He thought that she was having benign PVCs at that time.  She had no palpitations for 8 years and recently had her thyroid medications adjusted after which she began to have daily palpitations.  She had thyroid cancer back in 2016 and has had a total thyroidectomy on Synthroid replacement.  Most recent TSH is 0.37.  She drinks 1 cup of coffee a day.  She does not have sleep apnea by her own account.  I think her palpitations are related to her thyroid.  I will let her primary care doctor and endocrinologist to address this.  In the meantime, I am going get a 2D echo and a 2-week Zio patch to further evaluate.     Runell Gess MD FACP,FACC,FAHA, [facility] 05/09/2023 1:40 PM

## 2023-05-09 NOTE — Patient Instructions (Signed)
Medication Instructions:  Your physician recommends that you continue on your current medications as directed. Please refer to the Current Medication list given to you today.  *If you need a refill on your cardiac medications before your next appointment, please call your pharmacy*   Testing/Procedures: Your physician has requested that you have an echocardiogram. Echocardiography is a painless test that uses sound waves to create images of your heart. It provides your doctor with information about the size and shape of your heart and how well your heart's chambers and valves are working. This procedure takes approximately one hour. There are no restrictions for this procedure. Please do NOT wear cologne, perfume, aftershave, or lotions (deodorant is allowed). Please arrive 15 minutes prior to your appointment time. This will take place at [address]   ZIO XT- Long Term Monitor Instructions  Your physician has requested you wear a ZIO patch monitor for 14 days.  This is a single patch monitor. [employer] supplies one patch monitor per enrollment. Additional stickers are not available. Please do not apply patch if you will be having a Nuclear Stress Test,  Echocardiogram, Cardiac CT, MRI, or Chest Xray during the period you would be wearing the  monitor. The patch cannot be worn during these tests. You cannot remove and re-apply the  ZIO XT patch monitor.  Your ZIO patch monitor will be mailed 3 day USPS to your address on file. It may take 3-5 days  to receive your monitor after you have been enrolled.  Once you have received your monitor, please review the enclosed instructions. Your monitor  has already been registered assigning a specific monitor serial # to you.  Billing and Patient Assistance Program Information  We have supplied [employer] with any of your insurance information on file for billing purposes. [employer] offers a sliding scale Patient Assistance Program for  patients that do not have  insurance, or whose insurance does not completely cover the cost of the ZIO monitor.  You must apply for the Patient Assistance Program to qualify for this discounted rate.  To apply, please call [employer] at [phone], select option 4, select option 2, ask to apply for  Patient Assistance Program. [employer] will ask your household income, and how many people  are in your household. They will quote your out-of-pocket cost based on that information.  [employer] will also be able to set up a 12-month, interest-free payment plan if needed.  Applying the monitor   Shave hair from upper left chest.  Hold abrader disc by orange tab. Rub abrader in 40 strokes over the upper left chest as  indicated in your monitor instructions.  Clean area with 4 enclosed alcohol pads. Let dry.  Apply patch as indicated in monitor instructions. Patch will be placed under collarbone on left  side of chest with arrow pointing upward.  Rub patch adhesive wings for 2 minutes. Remove white label marked "1". Remove the white  label marked "2". Rub patch adhesive wings for 2 additional minutes.  While looking in a mirror, press and release button in center of patch. A small green light will  flash 3-4 times. This will be your only indicator that the monitor has been turned on.  Do not shower for the first 24 hours. You may shower after the first 24 hours.  Press the button if you feel a symptom. You will hear a small click. Record Date, Time and  Symptom in the Patient Logbook.  When you   are ready to remove the patch, follow instructions on the last 2 pages of Patient  Logbook. Stick patch monitor onto the last page of Patient Logbook.  Place Patient Logbook in the blue and white box. Use locking tab on box and tape box closed  securely. The blue and white box has prepaid postage on it. Please place it in the mailbox as  soon as possible. Your physician should have your test results approximately  7 days after the  monitor has been mailed back to [employer].  Call [employer] Customer Care at [phone] if you have questions regarding  your ZIO XT patch monitor. Call them immediately if you see an orange light blinking on your  monitor.  If your monitor falls off in less than 4 days, contact our Monitor department at [phone].  If your monitor becomes loose or falls off after 4 days call [employer] at [phone] for  suggestions on securing your monitor   Follow-Up: At [facility], you and your health needs are our priority.  As part of our continuing mission to provide you with exceptional heart care, we have created designated Provider Care Teams.  These Care Teams include your primary Cardiologist (physician) and Advanced Practice Providers (APPs -  Physician Assistants and Nurse Practitioners) who all work together to provide you with the care you need, when you need it.  We recommend signing up for the patient portal called "MyChart".  Sign up information is provided on this After Visit Summary.  MyChart is used to connect with patients for Virtual Visits (Telemedicine).  Patients are able to view lab/test results, encounter notes, upcoming appointments, etc.  Non-urgent messages can be sent to your provider as well.   To learn more about what you can do with MyChart, go to [email or web address].    Your next appointment:   3 month(s)  Provider:   Jonathan Berry, MD  

## 2023-05-09 NOTE — Progress Notes (Unsigned)
Enrolled for [employer] to mail a ZIO XT long term holter monitor to the patients address on file.  

## 2023-05-09 NOTE — Assessment & Plan Note (Signed)
Sharon Benson was referred to me by the emergency room because of palpitations.  She has seen Dr. Elease Hashimoto back in 2016 for palpitations.  He thought that she was having benign PVCs at that time.  She had no palpitations for 8 years and recently had her thyroid medications adjusted after which she began to have daily palpitations.  She had thyroid cancer back in 2016 and has had a total thyroidectomy on Synthroid replacement.  Most recent TSH is 0.37.  She drinks 1 cup of coffee a day.  She does not have sleep apnea by her own account.  I think her palpitations are related to her thyroid.  I will let her primary care doctor and endocrinologist to address this.  In the meantime, I am going get a 2D echo and a 2-week Zio patch to further evaluate.

## 2023-05-10 LAB — T3, FREE: T3, Free: 4.7 pg/mL — ABNORMAL HIGH (ref 2.3–4.2)

## 2023-05-10 LAB — TSH: TSH: 0.01 u[IU]/mL — ABNORMAL LOW (ref 0.35–5.50)

## 2023-05-10 LAB — T4, FREE: Free T4: 1.59 ng/dL (ref 0.60–1.60)

## 2023-05-14 MED ORDER — WEGOVY 0.25 MG/0.5ML ~~LOC~~ SOAJ
0.2500 mg | SUBCUTANEOUS | 0 refills | Status: DC
Start: 2023-05-14 — End: 2023-12-14

## 2023-05-14 NOTE — Addendum Note (Signed)
Addended by: Eden Emms on: 05/14/2023 04:01 PM   Modules accepted: Orders

## 2023-05-15 ENCOUNTER — Telehealth: Payer: Self-pay | Admitting: Cardiovascular Disease

## 2023-05-15 ENCOUNTER — Telehealth: Payer: Self-pay

## 2023-05-15 NOTE — Telephone Encounter (Signed)
Patient states she received her heart monitor, but she would like to know if she can apply it on/after 6/17 when she returns from vacation. She states she will be gone for 2 weeks and she expects to be swimming and sweating a lot.

## 2023-05-15 NOTE — Telephone Encounter (Signed)
Spoke with patient. She has a vacation coming up and would like to delay wearing heart monitor until she returns. She said she will be doing way more activity than normal, be in the water more and sweating more. Advised activity level should not be an issue but that swimming and sweaty could impair monitor adherence. She will delay putting this on and she also has echo 7/1

## 2023-05-15 NOTE — Telephone Encounter (Signed)
Error

## 2023-05-29 DIAGNOSIS — R002 Palpitations: Secondary | ICD-10-CM

## 2023-06-12 ENCOUNTER — Encounter (HOSPITAL_COMMUNITY): Payer: Self-pay | Admitting: Cardiovascular Disease

## 2023-06-12 ENCOUNTER — Ambulatory Visit (HOSPITAL_COMMUNITY): Payer: PRIVATE HEALTH INSURANCE | Attending: Cardiovascular Disease

## 2023-06-12 ENCOUNTER — Ambulatory Visit: Payer: PRIVATE HEALTH INSURANCE | Admitting: Nurse Practitioner

## 2023-06-13 ENCOUNTER — Encounter: Payer: Self-pay | Admitting: Nurse Practitioner

## 2023-06-21 ENCOUNTER — Encounter: Payer: Self-pay | Admitting: Nurse Practitioner

## 2023-06-28 ENCOUNTER — Encounter: Payer: Self-pay | Admitting: Cardiovascular Disease

## 2023-07-27 ENCOUNTER — Ambulatory Visit (INDEPENDENT_AMBULATORY_CARE_PROVIDER_SITE_OTHER): Payer: PRIVATE HEALTH INSURANCE | Admitting: Nurse Practitioner

## 2023-07-27 ENCOUNTER — Encounter: Payer: Self-pay | Admitting: Nurse Practitioner

## 2023-07-27 VITALS — BP 112/80 | HR 72 | Temp 97.8°F | Ht 64.0 in | Wt 272.0 lb

## 2023-07-27 DIAGNOSIS — D367 Benign neoplasm of other specified sites: Secondary | ICD-10-CM

## 2023-07-27 DIAGNOSIS — R591 Generalized enlarged lymph nodes: Secondary | ICD-10-CM

## 2023-07-27 DIAGNOSIS — E039 Hypothyroidism, unspecified: Secondary | ICD-10-CM

## 2023-07-27 LAB — TSH: TSH: 1.21 u[IU]/mL (ref 0.35–5.50)

## 2023-07-27 LAB — T4, FREE: Free T4: 0.79 ng/dL (ref 0.60–1.60)

## 2023-07-27 MED ORDER — SULFAMETHOXAZOLE-TRIMETHOPRIM 800-160 MG PO TABS
1.0000 | ORAL_TABLET | Freq: Two times a day (BID) | ORAL | 0 refills | Status: DC
Start: 2023-07-27 — End: 2023-12-14

## 2023-07-27 NOTE — Assessment & Plan Note (Signed)
Will treat cyst with Bactrim DS 1 tab twice daily for 5 days.  Patient is established with dermatology follow-up for removal with dermatology

## 2023-07-27 NOTE — Assessment & Plan Note (Signed)
Possible lymphadenopathy under the left axilla.  Does feel like any of induration we will treat with antibiotics if does not improve consider ultrasound of the left axilla.  Patient's was reach out in 2 weeks for status update

## 2023-07-27 NOTE — Assessment & Plan Note (Signed)
History of same patient on NP thyroid 120 and levo thyroxine 100 mcg.  Pending TSH today

## 2023-07-27 NOTE — Progress Notes (Signed)
Acute Office Visit  Subjective:     Patient ID: Sharon Benson, female    DOB: 11-Apr-1976, 47 y.o.   MRN: [id number]  Chief Complaint  Patient presents with   Nevus    Pt complains of mole and knot on left under arm. Knot started a month ago, mole appeared this week. No pain, pt states color of mole changes when bathing.    Cyst    Pt would like a look at her cyst on forehead. States it is starting to leak.     HPI Patient is in today for multiple complaints with a history of asthma, GERD, hypothyroidism, seizures, anemia   Mole: states that she has a mole that came up in the past week under the left arm. States that it has not grown. But it is white in one areaa when she showers  Cyst: states that she has had the cyst for several years. States that over the past 6 months a year states that drainage as of past 2 months the drainage has been yellow and smelly. States that the cyst has gotten bigger. States that it is painful around the cyst    Review of Systems  Constitutional:  Negative for chills and fever.  Respiratory:  Negative for shortness of breath.   Cardiovascular:  Negative for chest pain.  Neurological:  Negative for headaches.        Objective:    BP 112/80   Pulse 72   Temp 97.8 F (36.6 C) (Temporal)   Ht 5\' 4"  (1.626 m)   Wt 272 lb (123.4 kg)   SpO2 98%   BMI 46.69 kg/m  BP Readings from Last 3 Encounters:  07/27/23 112/80  05/09/23 122/78  03/28/23 110/74   Wt Readings from Last 3 Encounters:  07/27/23 272 lb (123.4 kg)  05/09/23 271 lb 9.6 oz (123.2 kg)  03/28/23 271 lb 8 oz (123.2 kg)   SpO2 Readings from Last 3 Encounters:  07/27/23 98%  05/09/23 98%  03/28/23 98%      Physical Exam Vitals and nursing note reviewed.  Constitutional:      Appearance: Normal appearance.  Cardiovascular:     Rate and Rhythm: Normal rate and regular rhythm.     Heart sounds: Normal heart sounds.  Pulmonary:     Effort: Pulmonary effort is  normal.     Breath sounds: Normal breath sounds.  Skin:    Findings: Lesion present.       Neurological:     Mental Status: She is alert.     Results for orders placed or performed in visit on 07/27/23  TSH  Result Value Ref Range   TSH 1.21 0.35 - 5.50 uIU/mL  T4, free  Result Value Ref Range   Free T4 0.79 0.60 - 1.60 ng/dL        Assessment & Plan:   Problem List Items Addressed This Visit       Endocrine   Hypothyroidism    History of same patient on NP thyroid 120 and levo thyroxine 100 mcg.  Pending TSH today      Relevant Orders   TSH (Completed)   T4, free (Completed)     Immune and Lymphatic   Lymphadenopathy    Possible lymphadenopathy under the left axilla.  Does feel like any of induration we will treat with antibiotics if does not improve consider ultrasound of the left axilla.  Patient's was reach out in 2 weeks for status update  Other   Dermoid cyst of head - Primary    Will treat cyst with Bactrim DS 1 tab twice daily for 5 days.  Patient is established with dermatology follow-up for removal with dermatology      Relevant Medications   sulfamethoxazole-trimethoprim (BACTRIM DS) 800-160 MG tablet    Meds ordered this encounter  Medications   sulfamethoxazole-trimethoprim (BACTRIM DS) 800-160 MG tablet    Sig: Take 1 tablet by mouth 2 (two) times daily.    Dispense:  10 tablet    Refill:  0    Order Specific Question:   Supervising Provider    Answer:   TOWER, MARNE A [[id number]]    No follow-ups on file.  Audria Nine, NP

## 2023-08-07 NOTE — Telephone Encounter (Signed)
I spoke with pt;on 08/06/23 pt brushed her teeth and afterwards vomited large amt of bright red blood; pt was nauseated all day yesterday and last night after supper pt vomited blood again but not as much bright red blood as the morning time. Pt said no abd pain and no fever. Pt has not looked at [facility] to see if BM is dark or blood with BM.pt thinks has been losing weight at home scale is 260 but pt is not sure if home scales are accurate.no vomiting today. Pt requested appt for 08/08/23 when pt is not working.pt scheduled appt with Audria Nine NP on 08/08/23 at 12 noon. UC & ED precautions given and pt voiced understanding. Sending note to Audria Nine NP and [place] pool.

## 2023-08-07 NOTE — Telephone Encounter (Signed)
Noted  

## 2023-08-08 ENCOUNTER — Encounter: Payer: Self-pay | Admitting: Nurse Practitioner

## 2023-08-08 ENCOUNTER — Encounter: Payer: Self-pay | Admitting: *Deleted

## 2023-08-08 ENCOUNTER — Ambulatory Visit (INDEPENDENT_AMBULATORY_CARE_PROVIDER_SITE_OTHER): Payer: Self-pay | Admitting: Nurse Practitioner

## 2023-08-08 VITALS — BP 100/60 | HR 83 | Temp 97.9°F | Ht 64.0 in | Wt 270.4 lb

## 2023-08-08 DIAGNOSIS — R112 Nausea with vomiting, unspecified: Secondary | ICD-10-CM

## 2023-08-08 DIAGNOSIS — R591 Generalized enlarged lymph nodes: Secondary | ICD-10-CM

## 2023-08-08 DIAGNOSIS — K2100 Gastro-esophageal reflux disease with esophagitis, without bleeding: Secondary | ICD-10-CM

## 2023-08-08 DIAGNOSIS — R1011 Right upper quadrant pain: Secondary | ICD-10-CM

## 2023-08-08 LAB — COMPREHENSIVE METABOLIC PANEL
ALT: 9 U/L (ref 0–35)
AST: 12 U/L (ref 0–37)
Albumin: 3.8 g/dL (ref 3.5–5.2)
Alkaline Phosphatase: 64 U/L (ref 39–117)
BUN: 12 mg/dL (ref 6–23)
CO2: 28 mEq/L (ref 19–32)
Calcium: 9.4 mg/dL (ref 8.4–10.5)
Chloride: 103 mEq/L (ref 96–112)
Creatinine, Ser: 0.82 mg/dL (ref 0.40–1.20)
GFR: 86.55 mL/min (ref >60.00)
Glucose, Bld: 100 mg/dL — ABNORMAL HIGH (ref 70–99)
Potassium: 4 mEq/L (ref 3.5–5.1)
Sodium: 138 mEq/L (ref 135–145)
Total Bilirubin: 0.4 mg/dL (ref 0.2–1.2)
Total Protein: 7.3 g/dL (ref 6.0–8.3)

## 2023-08-08 LAB — CBC
HCT: 40.7 % (ref 36.0–46.0)
Hemoglobin: 13.1 g/dL (ref 12.0–15.0)
MCHC: 32.1 g/dL (ref 30.0–36.0)
MCV: 86.5 fl (ref 78.0–100.0)
Platelets: 229 10*3/uL (ref 150.0–400.0)
RBC: 4.7 Mil/uL (ref 3.87–5.11)
RDW: 14.3 % (ref 11.5–15.5)
WBC: 5.8 10*3/uL (ref 4.0–10.5)

## 2023-08-08 LAB — LIPASE: Lipase: 18 U/L (ref 11.0–59.0)

## 2023-08-08 MED ORDER — PANTOPRAZOLE SODIUM 20 MG PO TBEC
20.0000 mg | DELAYED_RELEASE_TABLET | Freq: Every day | ORAL | 0 refills | Status: DC
Start: 2023-08-08 — End: 2023-12-14

## 2023-08-08 NOTE — Patient Instructions (Signed)
Nice to see you today Call and schedule the ultrasound Follow up if you do not improve and we will do further testing

## 2023-08-08 NOTE — Assessment & Plan Note (Signed)
Just 2 episodes of emesis.  Has resolved will check basic labs inclusive of lipase

## 2023-08-08 NOTE — Assessment & Plan Note (Signed)
Patient still feels that under the left axilla ultrasound ordered today.  Patient was given information to call and set up

## 2023-08-08 NOTE — Assessment & Plan Note (Signed)
History of the same has been off of Protonix restart Protonix 20 mg daily for 30 days at least

## 2023-08-08 NOTE — Assessment & Plan Note (Signed)
Check CBC CMP and lipase.  Will treat patient for gastritis with Protonix.  If no improvement consider doing ultrasound right upper quadrant as patient's gallbladder still intact

## 2023-08-08 NOTE — Progress Notes (Unsigned)
Acute Office Visit  Subjective:     Patient ID: Sharon Benson, female    DOB: 1976/10/07, 47 y.o.   MRN: [id number]  Chief Complaint  Patient presents with   Emesis    Pt complains of vomiting w bright red blood while brushing teeth. Pt states she gags and has mucus come up from brushing daily but stated this time there was a lot of blood. Pt states she felt nausea all day, threw up again with less blood. Complains of burning feeling in stomach.      Patient is in today for emesis  States that it happpene don sudnay with dry heaving then 2 episodes of bloody emesis. States that she did throw up in the sick. It was bright red in both episodes. States that she is still having the nausea/quesy feeling. States that she is also having diarrhea. States that dad is not feeling well but he does not have the same symptoms. States that she had not had anything to eat that morning. States that she is having a burning sensation in her stomach and her heartburn has been more prevalent over the past couple days  Review of Systems  Constitutional:  Negative for chills and fever.  Respiratory:  Negative for shortness of breath.   Cardiovascular:  Negative for chest pain.  Gastrointestinal:  Positive for diarrhea and vomiting. Negative for abdominal pain.        Objective:    BP 100/60   Pulse 83   Temp 97.9 F (36.6 C) (Temporal)   Ht 5\' 4"  (1.626 m)   Wt 270 lb 6.4 oz (122.7 kg)   SpO2 97%   BMI 46.41 kg/m  {Vitals History (Optional):23777}  Physical Exam Vitals and nursing note reviewed.  Constitutional:      Appearance: Normal appearance.  Cardiovascular:     Rate and Rhythm: Normal rate and regular rhythm.     Heart sounds: Normal heart sounds.  Pulmonary:     Effort: Pulmonary effort is normal.     Breath sounds: Normal breath sounds.  Abdominal:     General: Bowel sounds are normal. There is no distension.     Palpations: There is no mass.     Tenderness: There is  abdominal tenderness in the right upper quadrant.     Hernia: No hernia is present.    Neurological:     Mental Status: She is alert.     No results found for any visits on 08/08/23.      Assessment & Plan:   Problem List Items Addressed This Visit       Digestive   Gastroesophageal reflux disease    History of the same has been off of Protonix restart Protonix 20 mg daily for 30 days at least      Relevant Medications   pantoprazole (PROTONIX) 20 MG tablet   Nausea and vomiting - Primary    Just 2 episodes of emesis.  Has resolved will check basic labs inclusive of lipase      Relevant Orders   CBC   Comprehensive metabolic panel   Lipase     Immune and Lymphatic   Lymphadenopathy    Patient still feels that under the left axilla ultrasound ordered today.  Patient was given information to call and set up      Relevant Orders   [name] AXILLA LEFT     Other   RUQ pain    Check CBC CMP and lipase.  Will  treat patient for gastritis with Protonix.  If no improvement consider doing ultrasound right upper quadrant as patient's gallbladder still intact      Relevant Orders   CBC   Comprehensive metabolic panel    Meds ordered this encounter  Medications   pantoprazole (PROTONIX) 20 MG tablet    Sig: Take 1 tablet (20 mg total) by mouth daily.    Dispense:  30 tablet    Refill:  0    Order Specific Question:   Supervising Provider    Answer:   TOWER, MARNE A [[id number]]    Return if symptoms worsen or fail to improve.  Audria Nine, NP

## 2023-08-16 ENCOUNTER — Ambulatory Visit
Admission: RE | Admit: 2023-08-16 | Discharge: 2023-08-16 | Disposition: A | Discharge status: Home / Self Care | Payer: PRIVATE HEALTH INSURANCE | Source: Ambulatory Visit | Attending: Nurse Practitioner | Admitting: Nurse Practitioner

## 2023-08-16 DIAGNOSIS — R591 Generalized enlarged lymph nodes: Secondary | ICD-10-CM | POA: Insufficient documentation

## 2023-08-22 ENCOUNTER — Ambulatory Visit: Payer: BC Managed Care – PPO | Attending: Cardiovascular Disease | Admitting: Cardiovascular Disease

## 2023-10-23 ENCOUNTER — Encounter: Payer: Self-pay | Admitting: Nurse Practitioner

## 2023-11-01 ENCOUNTER — Encounter: Payer: Self-pay | Admitting: Bariatrics

## 2023-12-13 NOTE — Progress Notes (Signed)
 Aneyah Lortz T. Denard Tuminello, MD, CAQ [facility] at [facility] [address] [place], [address]  Phone: [phone]  FAX: [phone]  Sharon Benson - 48 y.o. female  MRN [id number]  Date of Birth: 1976/06/18  Date: 12/14/2023  PCP: Wendee Lynwood HERO, NP  Referral: Wendee Lynwood HERO, NP  Chief Complaint  Patient presents with   Cough    Exposed to RSV from daughter No Covid Test   Nasal Congestion   Otalgia    Right Ear    Shoulder Pain    Pain under right shoulder blade   Subjective:   Sharon Benson is a 48 y.o. very pleasant female patient with Body mass index is 46.69 kg/m. who presents with the following:  The patient presents with ongoing acute respiratory illness.  Her daughter does have RSV, confirmed by testing.  Feeling horrible.  Congestion  Runny nose Wheezing - hard to breath.  She does have a history of asthma. R back pain under the shoulder R ear pain  Her chart does show Symbicort, however she does not take this regularly.  She is unsure about this, and she did show me a albuterol  inhaler that she does use sometimes.  No fever Decreased po intake Some ST - not now  R exterior canal TTP - TM clear  Review of Systems is noted in the HPI, as appropriate  Objective:   BP 100/70 (BP Location: Left Arm, Patient Position: Sitting, Cuff Size: Large)   Pulse 100   Temp (!) 97.1 F (36.2 C) (Temporal)   Ht 5' 4 (1.626 m)   Wt 272 lb (123.4 kg)   SpO2 97%   BMI 46.69 kg/m    Gen: WDWN, NAD. Globally Non-toxic HEENT: Throat clear, w/o exudate, R TM clear, L TM - good landmarks, No fluid present. rhinnorhea.  MMM Frontal sinuses: NT Max sinuses: NT NECK: Anterior cervical  LAD is absent CV: RRR, No M/G/R, cap refill <2 sec PULM: Breathing comfortably in no respiratory distress. no wheezing, crackles, rhonchi   Laboratory and Imaging Data: Results for orders placed or performed in visit on 12/14/23   POC COVID-19   Collection Time: 12/14/23 11:38 AM  Result Value Ref Range   SARS Coronavirus 2 Ag Negative Negative  POC Influenza A&B (Binax test)   Collection Time: 12/14/23 11:38 AM  Result Value Ref Range   Influenza A, POC Negative Negative   Influenza B, POC Negative Negative     Assessment and Plan:     ICD-10-CM   1. Viral URI  J06.9     2. Acute cough  R05.1 POC COVID-19    POC Influenza A&B (Binax test)    3. Mild intermittent asthma with exacerbation  J45.21      Anticipate viral respiratory illness, most likely RSV since her daughter has RSV.  No COVID or flu.  She has having some mild asthma flare with some wheezing.  I am going to give her a round of some oral steroids and have her use her albuterol  as needed.  Medication Management during today's office visit: Meds ordered this encounter  Medications   predniSONE  (DELTASONE ) 20 MG tablet    Sig: 2 tabs po daily for 5 days, then 1 tab po daily for 5 days    Dispense:  15 tablet    Refill:  0   Medications Discontinued During This Encounter  Medication Reason   levocetirizine (XYZAL ) 5 MG tablet Completed Course  cetirizine  (ZYRTEC ) 10 MG tablet Completed Course   Semaglutide -Weight Management (WEGOVY ) 0.25 MG/0.5ML SOAJ Completed Course   sulfamethoxazole -trimethoprim  (BACTRIM  DS) 800-160 MG tablet Completed Course   pantoprazole  (PROTONIX ) 20 MG tablet Completed Course    Orders placed today for conditions managed today: Orders Placed This Encounter  Procedures   POC COVID-19   POC Influenza A&B (Binax test)    Disposition: No follow-ups on file.  Dragon [employer] One speech-to-text software was used for transcription in this dictation.  Possible transcriptional errors can occur using Animal nutritionist.   Signed,  Jacques DASEN. Shelton Square, MD   Outpatient Encounter Medications as of 12/14/2023  Medication Sig   albuterol  (PROVENTIL  HFA;VENTOLIN  HFA) 108 (90 Base) MCG/ACT inhaler Inhale 2 puffs into  the lungs every 4 (four) hours as needed for wheezing or shortness of breath.   budesonide-formoterol (SYMBICORT) 80-4.5 MCG/ACT inhaler 2 puffs Inhalation twice a day for 30 days   EPINEPHrine  0.3 mg/0.3 mL IJ SOAJ injection Inject 0.3 mg into the muscle as needed for anaphylaxis.   fluticasone  (FLONASE ) 50 MCG/ACT nasal spray Place 2 sprays into both nostrils daily.   ibuprofen  (ADVIL ) 800 MG tablet Take 1 tablet (800 mg total) by mouth every 8 (eight) hours as needed.   levonorgestrel (MIRENA) 20 MCG/24HR IUD 1 each by Intrauterine route continuous.   levothyroxine  (SYNTHROID ) 100 MCG tablet TAKE 1 TABLET BY MOUTH EVERY DAY   predniSONE  (DELTASONE ) 20 MG tablet 2 tabs po daily for 5 days, then 1 tab po daily for 5 days   thyroid  (ARMOUR) 60 MG tablet Take 60 mg by mouth daily before breakfast.   [DISCONTINUED] cetirizine  (ZYRTEC ) 10 MG tablet Oral   [DISCONTINUED] levocetirizine (XYZAL ) 5 MG tablet Take 1 tablet (5 mg total) by mouth every evening. (Patient not taking: Reported on 08/08/2023)   [DISCONTINUED] pantoprazole  (PROTONIX ) 20 MG tablet Take 1 tablet (20 mg total) by mouth daily.   [DISCONTINUED] Semaglutide -Weight Management (WEGOVY ) 0.25 MG/0.5ML SOAJ Inject 0.25 mg into the skin once a week. (Patient not taking: Reported on 08/08/2023)   [DISCONTINUED] sulfamethoxazole -trimethoprim  (BACTRIM  DS) 800-160 MG tablet Take 1 tablet by mouth 2 (two) times daily.   No facility-administered encounter medications on file as of 12/14/2023.

## 2023-12-14 ENCOUNTER — Ambulatory Visit (INDEPENDENT_AMBULATORY_CARE_PROVIDER_SITE_OTHER): Payer: Managed Care, Other (non HMO) | Admitting: Family Medicine

## 2023-12-14 ENCOUNTER — Encounter: Payer: Self-pay | Admitting: Family Medicine

## 2023-12-14 VITALS — BP 100/70 | HR 100 | Temp 97.1°F | Ht 64.0 in | Wt 272.0 lb

## 2023-12-14 DIAGNOSIS — R051 Acute cough: Secondary | ICD-10-CM | POA: Diagnosis not present

## 2023-12-14 DIAGNOSIS — J4521 Mild intermittent asthma with (acute) exacerbation: Secondary | ICD-10-CM

## 2023-12-14 DIAGNOSIS — J069 Acute upper respiratory infection, unspecified: Secondary | ICD-10-CM | POA: Diagnosis not present

## 2023-12-14 LAB — POC COVID19 BINAXNOW: SARS Coronavirus 2 Ag: NEGATIVE

## 2023-12-14 LAB — POC INFLUENZA A&B (BINAX/QUICKVUE)
Influenza A, POC: NEGATIVE
Influenza B, POC: NEGATIVE

## 2023-12-14 MED ORDER — PREDNISONE 20 MG PO TABS: ORAL_TABLET | ORAL | 0 refills | Status: DC

## 2024-01-24 LAB — TSH RFX ON ABNORMAL TO FREE T4
Free T4: 1.03 ng/dL
TSH: 14.1 — AB (ref 0.41–5.90)

## 2024-02-16 ENCOUNTER — Encounter: Payer: Self-pay | Admitting: Nurse Practitioner

## 2024-02-19 ENCOUNTER — Telehealth: Payer: Self-pay | Admitting: Nurse Practitioner

## 2024-02-19 NOTE — Telephone Encounter (Signed)
Sent patient a Production managermychart reply

## 2024-02-19 NOTE — Telephone Encounter (Signed)
 Copied from CRM [id number]. Topic: Clinical - Medication Question >> Feb 19, 2024  3:03 PM Gurney Maxin H wrote: Reason for CRM: Patient wants to know if her levothyroxine (SYNTHROID) 100 MCG tablet will be changed based off of her recent labs, please reach out to patient with some clarity, thanks.  Heena [phone]

## 2024-02-22 ENCOUNTER — Telehealth: Payer: Self-pay

## 2024-02-22 DIAGNOSIS — E039 Hypothyroidism, unspecified: Secondary | ICD-10-CM

## 2024-02-22 NOTE — Telephone Encounter (Signed)
 Copied from CRM [id number]. Topic: Clinical - Medication Question >> Feb 19, 2024  3:03 PM Gurney Maxin H wrote: Reason for CRM: Patient wants to know if her levothyroxine (SYNTHROID) 100 MCG tablet will be changed based off of her recent labs, please reach out to patient with some clarity, thanks.  Sharon Benson [phone] >> Feb 22, 2024  3:47 PM Sharon Benson wrote: Patient wants to round back up regarding medication. Patient stated that the levothyroxine 100mg  does not give her palpitations. She would like to know what medications she should be taking due to the recent blood work that was taken.

## 2024-02-23 NOTE — Telephone Encounter (Signed)
 Copied from CRM [id number]. Topic: Clinical - Medication Question >> Feb 19, 2024  3:03 PM Gurney Maxin H wrote: Reason for CRM: Patient wants to know if her levothyroxine (SYNTHROID) 100 MCG tablet will be changed based off of her recent labs, please reach out to patient with some clarity, thanks.  Dafney [phone] >> Feb 23, 2024  4:26 PM Isabell A wrote: Patient calling to follow up on her thyroid medication.  >> Feb 22, 2024  3:47 PM [place] J wrote: Patient wants to round back up regarding medication. Patient stated that the levothyroxine 100mg  does not give her palpitations. She would like to know what medications she should be taking due to the recent blood work that was taken.

## 2024-02-23 NOTE — Telephone Encounter (Addendum)
 Called patient she is taking NP thyroid 120mg  and the levothryoxine every day. Denies any missed doses. She has been having increased fatigue.    She did verify that the 112 gave her palpitations.

## 2024-02-23 NOTE — Telephone Encounter (Signed)
 My understanding is that the patinet was taking np thyroid 120 and levothyroxine . We had her on in the past and I think that is the one that gave her palpitations. Can we clarify with the patient.  If she is doing NP thyroid 120mg  and the levothryoxine then I will make some changes

## 2024-02-26 NOTE — Telephone Encounter (Signed)
 With out going back to endocrine we can keep her the same on the NP thyroid and try levothyroxine every other day with levothyroxine every other day if she is amendable

## 2024-02-26 NOTE — Telephone Encounter (Signed)
 Called patient and reviewed all information. Patient verbalized understanding.  She is agreeable to the medication change. She states she needs a refill on NP Thyroid and levothyroxine 112 mcg.

## 2024-02-27 MED ORDER — LEVOTHYROXINE SODIUM 100 MCG PO TABS: ORAL_TABLET | ORAL | Status: DC

## 2024-02-27 MED ORDER — THYROID 60 MG PO TABS
120.0000 mg | ORAL_TABLET | Freq: Every day | ORAL | 1 refills | Status: DC
Start: 2024-02-27 — End: 2024-09-04

## 2024-02-27 MED ORDER — LEVOTHYROXINE SODIUM 112 MCG PO TABS
ORAL_TABLET | ORAL | 0 refills | Status: DC
Start: 2024-02-27 — End: 2024-09-04

## 2024-02-27 NOTE — Telephone Encounter (Signed)
 Refill and medication sent in she will need a 6 week lab appointment

## 2024-02-27 NOTE — Telephone Encounter (Signed)
 Called patient lab appointment scheduled. Will call if any questions.

## 2024-02-27 NOTE — Addendum Note (Signed)
 Addended by: Eden Emms on: 02/27/2024 07:37 AM   Modules accepted: Orders

## 2024-03-27 ENCOUNTER — Ambulatory Visit: Payer: Self-pay

## 2024-03-27 NOTE — Telephone Encounter (Signed)
 Chief Complaint: Cyst on right inner elbow Symptoms: inflammation and pain Frequency: 1 week Pertinent Negatives: Patient denies fever, open skin Disposition: [] ED /[] Urgent Care (no appt availability in office) / [x] Appointment(In office/virtual)/ []  [facility]/ [] Home Care/ [] Refused Recommended Disposition /[] [facility]/ []  Follow-up with PCP Additional Notes: Patient called in to make an appointment for asap for a cyst/lump on the inner elbow of her right arm. Patient noticed she was losing dexterity and weakness of her over the last week and having an extreme amount of pain when moving her arm and elbow in certain directions. Patient states she examined her arm and noticed she had a hard nodule on the inner elbow of her right arm that is the size of a quarter. Patient states the area around it is inflamed and mildly warm to the touch. Patient denies any open skin/boil appearance. Patient appt for tomorrow for further evaluation.   Copied from CRM [id number]. Topic: Clinical - Red Word Triage >> Mar 27, 2024  3:58 PM Sharon Benson wrote: Red Word that prompted transfer to Nurse Triage: Painful lump or knot in inner side of elbow on right arm. Directly in middle of fold of arm. Feels solid and hard in center, but soft and tender surrounding. Feels numbness and tingling shooting up arm. Can't squeeze or move arm normally. Has to force right hand closed as it is not fully closing regularly, had to use left hand to make right hand close. Outside of this patient states she currently has 3 cysts in wrist but they are not problematic. Has had previous cysts surgically removed and says she gets them throughout her body. But has never had pain or symptoms like this elbow area is causing. Reason for Disposition  [1] Small swelling or lump AND [2] unexplained AND [3] present > 1 week  Answer Assessment - Initial Assessment Questions 1. APPEARANCE of SWELLING: "What does it look like?"      A knot the size of a nickel with a large area of surrounding inflammation 2. SIZE: "How large is the swelling?" (e.g., inches, cm; or compare to size of pinhead, tip of pen, eraser, coin, pea, grape, ping pong ball)      Hard part size of nickel/quarter 3. LOCATION: "Where is the swelling located?"     Inner elbow of right arm 4. ONSET: "When did the swelling start?"     A week ago 5. COLOR: "What color is it?" "Is there more than one color?"     Skin color 6. PAIN: "Is there any pain?" If Yes, ask: "How bad is the pain?" (e.g., scale 1-10; or mild, moderate, severe)     - NONE (0): no pain   - MILD (1-3): doesn't interfere with normal activities    - MODERATE (4-7): interferes with normal activities or awakens from sleep    - SEVERE (8-10): excruciating pain, unable to do any normal activities     Painful moving arm 7. ITCH: "Does it itch?" If Yes, ask: "How bad is the itch?"      No 8. CAUSE: "What do you think caused the swelling?"       Patient is uncertain if it is a cyst since she has 3 cysts on her wrist 9 OTHER SYMPTOMS: "Do you have any other symptoms?" (e.g., fever)     No  Protocols used: Skin Lump or Localized Swelling-A-AH

## 2024-03-27 NOTE — Telephone Encounter (Signed)
 Noted.

## 2024-03-27 NOTE — Telephone Encounter (Signed)
 Patient has appt tomorrow with Charanpreet Kaur, NP

## 2024-03-28 ENCOUNTER — Ambulatory Visit: Admitting: Nurse Practitioner

## 2024-03-28 ENCOUNTER — Encounter: Payer: Self-pay | Admitting: Nurse Practitioner

## 2024-03-28 VITALS — BP 126/82 | HR 88 | Temp 98.8°F | Ht 64.0 in | Wt 276.8 lb

## 2024-03-28 DIAGNOSIS — R200 Anesthesia of skin: Secondary | ICD-10-CM | POA: Diagnosis not present

## 2024-03-28 DIAGNOSIS — R202 Paresthesia of skin: Secondary | ICD-10-CM | POA: Diagnosis not present

## 2024-03-28 NOTE — Progress Notes (Signed)
 Established Patient Office Visit  Subjective:  Patient ID: Sharon Benson, female    DOB: [birth year]  Age: 48 y.o. MRN: [id number]  CC:  Chief Complaint  Patient presents with   Acute Visit    Cyst/ lump on right arm x 2 months Increased in size Painful and sends numbness & tingling down her right arm    HPI Sharon Benson is a 48 year old female  for acute visit who presents with a painful lump in her arm.  She has noticed a knot in her arm for about two months, which feels like something is pressing on a nerve, causing numbness and pain, especially when bending her arm. The lump occasionally swells and then reduces slightly, but the sensation of something being there persists.  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 The current lump is located under the skin, feeling soft on top with a harder area beneath. No redness has been observed over the lump. Denise any fall, injury. She work as a Producer, television/film/video perform repetitive movements.   The lump causes significant discomfort particularly when blow drying, which results in numbness and throbbing extending to her shoulder. The symptoms have become more consistent and are impacting her ability to work, as she often has to stop and adjust her shoulder to alleviate the discomfort.   HPI   Past Medical History:  Diagnosis Date   Anxiety    panic attack - not recent 2001ish   Arthritis    Asthma    Cancer (HCC)    thyroid  cancer 2016   Dizziness    Endometriosis    Fibroid    GERD (gastroesophageal reflux disease)    Headache(784.0)    History of multiple miscarriages    Hypothyroidism    Insomnia    Palpitations    PCOS (polycystic ovarian syndrome)    PONV (postoperative nausea and vomiting)    last 2- no vomiting-  Seizures- not with last 2.   Seizures (HCC)    last seizure 04/2017   SVD (spontaneous vaginal delivery)    x 1   Vertigo     Vitamin D  deficiency     Past Surgical History:  Procedure Laterality Date   BREAST REDUCTION SURGERY     BREAST SURGERY  1997   COLONOSCOPY     DILATION AND EVACUATION N/A 11/25/2013   Procedure: DILATATION AND EVACUATION;  Surgeon: Tresia Fruit, MD;  Location: [facility];  Service: Gynecology;  Laterality: N/A;   DILATION AND EVACUATION N/A 11/30/2013   Procedure: DILATATION AND EVACUATION;  Surgeon: Albino Hum, MD;  Location: [facility];  Service: Gynecology;  Laterality: N/A;   DILATION AND EVACUATION N/A 05/02/2014   Procedure: DILATATION AND EVACUATION;  Surgeon: Lula Sale, MD;  Location: [facility];  Service: Gynecology;  Laterality: N/A;   GANGLION CYST EXCISION  1999   right hand   mirena     REDUCTION MAMMAPLASTY Bilateral    THYROIDECTOMY Bilateral 01/02/2015   Procedure: TOTAL THYROIDECTOMY;  Surgeon: Vernadine Golas, MD;  Location: [facility] OR;  Service: ENT;  Laterality: Bilateral;   UPPER GI ENDOSCOPY      Family History  Problem Relation Age of Onset   Hypertension Father    Colon cancer Father        Dx at age 27 and it spread to his lungs, liver and bones   Lung cancer Maternal Grandmother  Colon cancer Maternal Grandmother    Cancer Paternal Grandmother        vaginal   Hypertension Paternal Grandmother    Diabetes Paternal Grandmother    Heart disease Paternal Grandmother    Cancer Other    Diabetes Other    High blood pressure Other    Esophageal cancer Neg Hx    Liver disease Neg Hx    Pancreatic cancer Neg Hx    Stomach cancer Neg Hx     Social History   Socioeconomic History   Marital status: Married    Spouse name: Aron Lard   Number of children: 4   Years of education: Not on file   Highest education level: Not on file  Occupational History   Occupation: [employer]  Tobacco Use   Smoking status: Former    Current packs/day: 0.00    Types: Cigarettes    Start date: 04/29/1995    Quit date: 04/28/1997    Years since quitting: 26.9   Smokeless  tobacco: Never   Tobacco comments:    smoked in HS  Vaping Use   Vaping status: Never Used  Substance and Sexual Activity   Alcohol use: No   Drug use: No    Types: Marijuana    Comment: 18 yrs ago   Sexual activity: Yes    Partners: Male    Birth control/protection: I.U.D.  Other Topics Concern   Not on file  Social History Narrative   Lives with husband, child 2 yrs old   caffeine coffee ,tea      Tabourea P (28) stepdaughter   Shaquan (26) step son   Alexxus Bernetta Brilliant (24) Bio sat   Ava Pittamin (5)   Social Drivers of Corporate investment banker Strain: Not on file  Food Insecurity: Not on file  Transportation Needs: Not on file  Physical Activity: Not on file  Stress: Not on file  Social Connections: Unknown (04/14/2022)   Received from [facility], [facility]   Social Network    Social Network: Not on file  Intimate Partner Violence: Unknown (03/17/2022)   Received from [facility], [facility]   HITS    Physically Hurt: Not on file    Insult or Talk Down To: Not on file    Threaten Physical Harm: Not on file    Scream or Curse: Not on file     Outpatient Medications Prior to Visit  Medication Sig Dispense Refill   albuterol  (PROVENTIL  HFA;VENTOLIN  HFA) 108 (90 Base) MCG/ACT inhaler Inhale 2 puffs into the lungs every 4 (four) hours as needed for wheezing or shortness of breath. 1 Inhaler 3   budesonide-formoterol (SYMBICORT) 80-4.5 MCG/ACT inhaler 2 puffs Inhalation twice a day for 30 days     EPINEPHrine  0.3 mg/0.3 mL IJ SOAJ injection Inject 0.3 mg into the muscle as needed for anaphylaxis.     fluticasone  (FLONASE ) 50 MCG/ACT nasal spray Place 2 sprays into both nostrils daily. 16 g 0   ibuprofen  (ADVIL ) 800 MG tablet Take 1 tablet (800 mg total) by mouth every 8 (eight) hours as needed. 30 tablet 0   levonorgestrel (MIRENA) 20 MCG/24HR IUD 1 each by Intrauterine route continuous.     levothyroxine  (SYNTHROID ) 100 MCG tablet Every other day. Opposite day  of the 112mcg dose     levothyroxine  (SYNTHROID ) 112 MCG tablet Every other day. Opposite day of 100mcg dose 90 tablet 0   thyroid  (ARMOUR) 60 MG tablet Take 2 tablets (120 mg total) by  mouth daily before breakfast. 180 tablet 1   predniSONE  (DELTASONE ) 20 MG tablet 2 tabs po daily for 5 days, then 1 tab po daily for 5 days (Patient not taking: Reported on 03/28/2024) 15 tablet 0   No facility-administered medications prior to visit.    Allergies  Allergen Reactions   Other Anaphylaxis and Itching    All melons  Makes her tongue swell   Cocoa Butter Hives   Codeine Other (See Comments)    seizures    ROS Review of Systems Negative unless indicated in HPI.    Objective:    Physical Exam Cardiovascular:     Rate and Rhythm: Normal rate and regular rhythm.     Pulses: Normal pulses.     Heart sounds: Normal heart sounds.  Musculoskeletal:        General: Tenderness (to medial forearm on palpation.) present.  Neurological:     General: No focal deficit present.     Mental Status: She is alert and oriented to person, place, and time.     BP 126/82   Pulse 88   Temp 98.8 F (37.1 C)   Ht 5\' 4"  (1.626 m)   Wt 276 lb 12.8 oz (125.6 kg)   SpO2 98%   BMI 47.51 kg/m  Wt Readings from Last 3 Encounters:  03/28/24 276 lb 12.8 oz (125.6 kg)  12/14/23 272 lb (123.4 kg)  08/08/23 270 lb 6.4 oz (122.7 kg)     Health Maintenance  Topic Date Due   Hepatitis C Screening  Never done   DTaP/Tdap/Td (1 - Tdap) Never done   Pneumococcal Vaccine 17-28 Years old (1 of 2 - PCV) Never done   Cervical Cancer Screening (HPV/Pap Cotest)  07/09/2016   COVID-19 Vaccine (3 - Moderna risk series) 04/13/2024 (Originally 06/25/2020)   INFLUENZA VACCINE  07/12/2024   Colonoscopy  10/24/2024   HIV Screening  Completed   HPV VACCINES  Aged Out   Meningococcal B Vaccine  Aged Out    There are no preventive care reminders to display for this patient.  Lab Results  Component Value Date    TSH 14.10 (A) 01/23/2024   Lab Results  Component Value Date   WBC 5.8 08/08/2023   HGB 13.1 08/08/2023   HCT 40.7 08/08/2023   MCV 86.5 08/08/2023   PLT 229.0 08/08/2023   Lab Results  Component Value Date   NA 138 08/08/2023   K 4.0 08/08/2023   CO2 28 08/08/2023   GLUCOSE 100 (H) 08/08/2023   BUN 12 08/08/2023   CREATININE 0.82 08/08/2023   BILITOT 0.4 08/08/2023   ALKPHOS 64 08/08/2023   AST 12 08/08/2023   ALT 9 08/08/2023   PROT 7.3 08/08/2023   ALBUMIN 3.8 08/08/2023   CALCIUM  9.4 08/08/2023   ANIONGAP 10 03/23/2023   GFR 86.55 08/08/2023   Lab Results  Component Value Date   CHOL 194 03/09/2023   Lab Results  Component Value Date   HDL 55.10 03/09/2023   Lab Results  Component Value Date   LDLCALC 126 (H) 03/09/2023   Lab Results  Component Value Date   TRIG 64.0 03/09/2023   Lab Results  Component Value Date   CHOLHDL 4 03/09/2023   Lab Results  Component Value Date   HGBA1C 5.7 03/09/2023      Assessment & Plan:  Numbness and tingling of right arm Assessment & Plan: No erythema, swelling to the right arm, palpable lump on the arm with numbness and tingling  going to the shoulder. 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  - Advised pt to see orthopedics at [facility]. - Will order nerve conduction study.  - She will let us  know if symptoms not improving.     Orders: -     Nerve conduction test; Future    Follow-up: No follow-ups on file.   Thelbert Gartin, NP

## 2024-04-05 ENCOUNTER — Encounter: Payer: Self-pay | Admitting: Nurse Practitioner

## 2024-04-05 DIAGNOSIS — R202 Paresthesia of skin: Secondary | ICD-10-CM | POA: Insufficient documentation

## 2024-04-05 NOTE — Assessment & Plan Note (Addendum)
 No erythema, swelling to the right arm, palpable lump on the arm with numbness and tingling going to the shoulder. 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  - Advised pt to see orthopedics at [facility]. - Will order nerve conduction study.  - She will let us  know if symptoms not improving.

## 2024-04-09 ENCOUNTER — Encounter: Payer: Self-pay | Admitting: Nurse Practitioner

## 2024-04-09 ENCOUNTER — Other Ambulatory Visit

## 2024-06-10 ENCOUNTER — Encounter: Payer: Self-pay | Admitting: Nurse Practitioner

## 2024-06-11 NOTE — Telephone Encounter (Signed)
 I spoke with pt; pt said on 06/10/24 pt noticed blood on her top. Pt is not sure if scratched tag mole off due to itching or if was rubbed off by bra. Pt said bleed on 06/10/24; changed bandaid x 3 on 06/10/24. Pt does not think bleeding today but still itches and is irritated by bra and area under breast some times moist due to location.pt said the tag mole had recently gotten larger; only change in mole noted.Offered pt appt 06/11/24 but due to pts schedule first appt pt could make was on 06/13/24 at 3:15 with Dr Jimmy.UC & ED precautions given and pt voiced understanding. Sending note to Dr Jimmy.

## 2024-06-11 NOTE — Telephone Encounter (Signed)
 Sounds okay to wait for appointment in 2 days

## 2024-06-13 ENCOUNTER — Ambulatory Visit: Payer: Self-pay | Admitting: Internal Medicine

## 2024-08-23 ENCOUNTER — Ambulatory Visit: Payer: Self-pay

## 2024-08-23 ENCOUNTER — Other Ambulatory Visit: Payer: Self-pay

## 2024-08-23 ENCOUNTER — Encounter (HOSPITAL_COMMUNITY): Payer: Self-pay

## 2024-08-23 ENCOUNTER — Emergency Department (HOSPITAL_COMMUNITY)
Admission: EM | Admit: 2024-08-23 | Discharge: 2024-08-23 | Disposition: A | Discharge status: Home / Self Care | Payer: Self-pay | Source: Ambulatory Visit | Attending: Emergency Medicine | Admitting: Emergency Medicine

## 2024-08-23 DIAGNOSIS — E039 Hypothyroidism, unspecified: Secondary | ICD-10-CM | POA: Insufficient documentation

## 2024-08-23 DIAGNOSIS — J45909 Unspecified asthma, uncomplicated: Secondary | ICD-10-CM | POA: Insufficient documentation

## 2024-08-23 DIAGNOSIS — Z79899 Other long term (current) drug therapy: Secondary | ICD-10-CM | POA: Insufficient documentation

## 2024-08-23 DIAGNOSIS — R519 Headache, unspecified: Secondary | ICD-10-CM

## 2024-08-23 DIAGNOSIS — Z720 Tobacco use: Secondary | ICD-10-CM | POA: Insufficient documentation

## 2024-08-23 DIAGNOSIS — Z7951 Long term (current) use of inhaled steroids: Secondary | ICD-10-CM | POA: Insufficient documentation

## 2024-08-23 DIAGNOSIS — D2339 Other benign neoplasm of skin of other parts of face: Secondary | ICD-10-CM | POA: Insufficient documentation

## 2024-08-23 MED ORDER — ACETAMINOPHEN ER 650 MG PO TBCR
650.0000 mg | EXTENDED_RELEASE_TABLET | Freq: Three times a day (TID) | ORAL | 0 refills | Status: AC
Start: 2024-08-23 — End: 2024-08-28

## 2024-08-23 MED ORDER — IBUPROFEN 600 MG PO TABS: 600.0000 mg | ORAL_TABLET | Freq: Three times a day (TID) | ORAL | 0 refills | Status: AC | PRN

## 2024-08-23 NOTE — Telephone Encounter (Signed)
 FYI Only or Action Required?: Action required by provider: update on patient condition.  Patient was last seen in primary care on 03/28/2024 by Vincente Saber, NP.  Called Nurse Triage reporting Cyst.  Symptoms began several weeks ago.  Interventions attempted: Nothing.  Symptoms are: gradually worsening.  Triage Disposition: See Physician Within 24 Hours  Patient/caregiver understands and will follow disposition?: Yes  Copied from CRM #[id number]. Topic: Clinical - Red Word Triage >> Aug 23, 2024 11:39 AM Mia F wrote: Red Word that prompted transfer to Nurse Triage: Pt says she has a cyst in her head. She has been seen for it before but it is now starting to grow. She says she has been losing hair around the spot and now it is very painful and much larger. The pain goes into her face and eye. She says she has an headache everyday from this. Reason for Disposition  [1] Swelling is painful to touch AND [2] no fever  Answer Assessment - Initial Assessment Questions 2. SIZE: How large is the swelling? (e.g., inches, cm; or compare to size of pinhead, tip of pen, eraser, coin, pea, grape, ping pong ball)      Grown from the size of an eraser to the size of a dime, is now hard 3. LOCATION: Where is the swelling located?     Around the hairline on the right side of her forehead (lines up with center of right eyebrow) 4. ONSET: When did the swelling start?     8 years ago, but has had significant growth over the past three weeks 5. COLOR: What color is it? Is there more than one color?     Color has changed from the color of her skin to a blackish brown 6. PAIN: Is there any pain? If Yes, ask: How bad is the pain? (Scale 1-10; or mild, moderate, severe)       Painful to touch.  10/10 7. ITCH: Does it itch? If Yes, ask: How bad is the itch?      denies 8. CAUSE: What do you think caused the swelling?     Has been diagnosed with cyst 9 OTHER SYMPTOMS: Do you have any  other symptoms? (e.g., fever)     Eye pain and swelling, right sided head ache daily.  Pain extends 2-3 inches from the site of the cyst Also with yellow, malodorous drainage  Protocols used: Skin Lump or Localized Swelling-A-AH

## 2024-08-23 NOTE — ED Provider Notes (Signed)
 [place] EMERGENCY DEPARTMENT AT [facility] Provider Note   CSN: [id number] Arrival date & time: 08/23/24  1255     Patient presents with: Cyst   Sharon Benson is a 48 y.o. female with past medical history of migraine, seizure, palpitations, asthma, hypothyroidism presents emergency department for evaluation of cyst on right upper forehead that has been present for the past 9 years.  She reports that it has been growing more over the past month.  Reports that it was originally the size of a pencil eraser but has increased to the size of a quarter.  Has been intermittently draining for the past few years.  Was evaluated by [place] plastic surgery on 05/05/2022 and was planning on excision in OR however patient lost her insurance and was unable to have surgery.  Was also evaluated by PCP on 07/27/2023 and treated with Bactrim  with no improvement of cyst size or drainage.  Sought ED evaluation today as she also has associated right-sided headache that has been gradually worsening over past 2 days.  Has history of headaches and this feels similar.  Did not take any OTC medications prior to arrival as she does not take medications.  Denies recent falls, blurred vision, sudden onset of maximal intensity    HPI     Prior to Admission medications   Medication Sig Start Date End Date Taking? Authorizing Provider  acetaminophen  (TYLENOL  8 HOUR) 650 MG CR tablet Take 1 tablet (650 mg total) by mouth every 8 (eight) hours for 5 days. 08/23/24 08/28/24 Yes Minnie Tinnie BRAVO, PA  ibuprofen  (ADVIL ) 600 MG tablet Take 1 tablet (600 mg total) by mouth every 8 (eight) hours as needed. 08/23/24  Yes Minnie Tinnie BRAVO, PA  albuterol  (PROVENTIL  HFA;VENTOLIN  HFA) 108 (90 Base) MCG/ACT inhaler Inhale 2 puffs into the lungs every 4 (four) hours as needed for wheezing or shortness of breath. 12/10/16   Muthersbaugh, Chiquita, PA-C  budesonide-formoterol (SYMBICORT) 80-4.5 MCG/ACT inhaler 2 puffs  Inhalation twice a day for 30 days    [provider]  EPINEPHrine  0.3 mg/0.3 mL IJ SOAJ injection Inject 0.3 mg into the muscle as needed for anaphylaxis. 08/11/21   [provider]  fluticasone  (FLONASE ) 50 MCG/ACT nasal spray Place 2 sprays into both nostrils daily. 04/21/22   Gladis Elsie BROCKS, PA-C  ibuprofen  (ADVIL ) 800 MG tablet Take 1 tablet (800 mg total) by mouth every 8 (eight) hours as needed. 05/29/22   Gasper Devere ORN, PA-C  levonorgestrel (MIRENA) 20 MCG/24HR IUD 1 each by Intrauterine route continuous.    [provider]  levothyroxine  (SYNTHROID ) 100 MCG tablet Every other day. Opposite day of the 112mcg dose 02/27/24   Wendee Lynwood HERO, NP  levothyroxine  (SYNTHROID ) 112 MCG tablet Every other day. Opposite day of 100mcg dose 02/27/24   Wendee Lynwood HERO, NP  thyroid  (ARMOUR) 60 MG tablet Take 2 tablets (120 mg total) by mouth daily before breakfast. 02/27/24   Wendee Lynwood HERO, NP    Allergies: Other, Cocoa butter, and Codeine    Review of Systems  Skin:  Positive for wound.    Updated Vital Signs BP (!) 149/106   Pulse 79   Temp 98.2 F (36.8 C) (Oral)   Resp 19   Ht 5' 4 (1.626 m)   Wt 124.3 kg   SpO2 99%   BMI 47.03 kg/m   Physical Exam Vitals and nursing note reviewed.  Constitutional:      General: She is not in  acute distress.    Appearance: Normal appearance. She is not ill-appearing.  HENT:     Head: Normocephalic and atraumatic. No raccoon eyes or Battle's sign.   Eyes:     General: Lids are normal. Vision grossly intact. No visual field deficit.    Extraocular Movements:     Right eye: Normal extraocular motion and no nystagmus.     Left eye: Normal extraocular motion and no nystagmus.     Conjunctiva/sclera: Conjunctivae normal.  Neck:     Comments: No meningismus Cardiovascular:     Rate and Rhythm: Normal rate.  Pulmonary:     Effort: Pulmonary effort is normal. No respiratory distress.     Breath sounds: Normal breath  sounds.  Musculoskeletal:     Cervical back: Normal range of motion and neck supple. No rigidity.     Right lower leg: No edema.     Left lower leg: No edema.  Skin:    Coloration: Skin is not jaundiced or pale.  Neurological:     Mental Status: She is alert and oriented to person, place, and time. Mental status is at baseline.     GCS: GCS eye subscore is 4. GCS verbal subscore is 5. GCS motor subscore is 6.     Cranial Nerves: No cranial nerve deficit, dysarthria or facial asymmetry.     Sensory: Sensation is intact. No sensory deficit.     Motor: No weakness, tremor, atrophy, abnormal muscle tone, seizure activity or pronator drift.     Coordination: Coordination normal. Finger-Nose-Finger Test and Heel to [place] Test normal.     Gait: Gait is intact. Gait normal.     Comments: Following commands appropriately.  Motor 5/5 in sensation to present to BUE and BLE.  No complaints of visual disturbances, dizziness     (all labs ordered are listed, but only abnormal results are displayed) Labs Reviewed - No data to display  EKG: None  Radiology: No results found.   Medications Ordered in the ED - No data to display                                  Medical Decision Making Risk OTC drugs. Prescription drug management.   Patient presents to the ED for concern of nodule, headache, this involves an extensive number of treatment options, and is a complaint that carries with it a high risk of complications and morbidity.  The differential diagnosis includes abscess, nodule, cyst, cellulitis, ICH, SAH, migraine   Co morbidities that complicate the patient evaluation  See HPI   Additional history obtained:  Additional history obtained from Nursing and Outside Medical Records   External records from outside source obtained and reviewed including triage RN note, plastic surgery note from 2023, PCP note from 2024     Medicines ordered and prescription drug management:  I  ordered medication including Tylenol , ibuprofen  for headache Reevaluation of the patient after these medicines showed that the patient improved I have reviewed the patients home medicines and have made adjustments as needed    Problem List / ED Course:  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  Do not see any  abscess that can be drained at this time Vital signs without fever or tachycardia Did offer to provide antibiotic as she reports that intermittently drains just to cover for possible infection however she does not wish to have prescription To provide ambulatory referral to dermatology as well as a number that she can call to establish care with dermatology for further management on DC paperwork  Acute non-intractable headache Neurologically intact.  No complaints of dizziness, lightheadedness.  Able to ambulate without difficulty.  No blurred vision. BP mildly elevated at SBP 149.  Doubt hypertensive urgency. Headache did not come on at maximum intensity and has been gradually worsening over past 2 days.  Does have a history of headaches and is in normal distribution.  Do not think that CT imaging is required at this time.  Low suspicion for ICH, SAH Did offer migraine cocktail however this would require IV, and having patient be driven home as a prep sedating properties.  Patient does not wish to have treatment here Emergency Department and will prefer to have a prescription for Tylenol , ibuprofen  for headache   Reevaluation:  After the interventions noted above, I reevaluated the patient and found that they have :stayed the same   Social Determinants of Health:  Former tobacco abuse Has  PCP   Dispostion:  After consideration of the diagnostic results and the patients response to treatment, I feel that the patent would benefit from outpatient management with symptomatic care.   Discussed ED workup, disposition, return to ED precautions with patient who expresses understanding agrees with plan.  All questions answered to their satisfaction.  They are agreeable to plan.  Discharge instructions provided on paperwork  Final diagnoses:  Dermoid cyst of forehead  Acute nonintractable headache, unspecified headache type    ED Discharge Orders          Ordered    Ambulatory referral to Dermatology        08/23/24 1543    acetaminophen  (TYLENOL  8 HOUR) 650 MG CR tablet  Every 8 hours        08/23/24 1544    ibuprofen  (ADVIL ) 600 MG tablet  Every 8 hours PRN        08/23/24 1544             Minnie Tinnie BRAVO, PA 08/23/24 1605    Cottie Donnice PARAS, MD 08/23/24 1642

## 2024-08-23 NOTE — ED Triage Notes (Addendum)
 Patient has had a cyst on her forehead for 9 years and has worsened over the last month. Stated it now has changed by becoming darker in color, bigger in size and has throbbing on the right side of her face. Has a headache now. Was going to have it removed by dermatologist but lost her insurance.

## 2024-08-23 NOTE — Telephone Encounter (Signed)
 Noted

## 2024-08-23 NOTE — Telephone Encounter (Signed)
 Sebastian Shu, CMA to Me  [place], Amy     08/23/24 12:32 PM Are you able to get this patient scheduled under a Same day appointment  I spoke with pt; pt has cyst in her head and pt is already at ED and pt decided to just stay at ED to be seen. Sending FYI to CHRISTELLA Crandall NP and  [employer].

## 2024-08-23 NOTE — Discharge Instructions (Signed)
 Thank you for letting us  evaluate you today.  It appears that you have access to your forehead.  It does not appear acutely infected.  I have sent a dermatology referral.  They should call you for an appointment.  Otherwise, you may also look around the area for dermatology.  There is dermatologist in [place] through [facility].  They may be able to take this cyst off and biopsy it to see what kind of cells come from lesion.  They may also be able to recommend you to plastic surgeon if necessary.  You may also reach out to your PCP for a referral.  I also sent Tylenol , ibuprofen  to your pharmacy to use intermittently every 8 hours for headache.  Return to emergency department if you experience blurred vision, loss of vision, numbness or weakness in one-sided body, inability to ambulate

## 2024-09-04 ENCOUNTER — Telehealth: Payer: Self-pay | Admitting: Nurse Practitioner

## 2024-09-04 DIAGNOSIS — E039 Hypothyroidism, unspecified: Secondary | ICD-10-CM

## 2024-09-04 MED ORDER — LEVOTHYROXINE SODIUM 112 MCG PO TABS: ORAL_TABLET | ORAL | 0 refills | Status: AC

## 2024-09-04 MED ORDER — THYROID 60 MG PO TABS: 120.0000 mg | ORAL_TABLET | Freq: Every day | ORAL | 1 refills | Status: AC

## 2024-09-04 MED ORDER — LEVOTHYROXINE SODIUM 100 MCG PO TABS: ORAL_TABLET | ORAL | 0 refills | Status: AC

## 2024-09-04 NOTE — Addendum Note (Signed)
 Addended by: WENDEE LYNWOOD HERO on: 09/04/2024 02:14 PM   Modules accepted: Orders

## 2024-09-04 NOTE — Telephone Encounter (Signed)
 Refills sent in for medication. Will see patient in office

## 2024-09-04 NOTE — Telephone Encounter (Signed)
 Called and spoke with patient.   Pt wants to let pcp know that her thyroid  levels will be off for labs on 09/09/2024 as she has been out for some days and unable to be on track.

## 2024-09-04 NOTE — Telephone Encounter (Signed)
 Copied from CRM #[id number]. Topic: Appointments - Appointment Scheduling >> Sep 04, 2024 11:03 AM Donna BRAVO wrote: Patient/patient representative is calling to schedule an appointment. Refer to attachments for appointment information.   patient has a cyst on her head, the past 2 to 3 weeks the cyst has changed, color, size, weeps once in a while, yellow liquid, outside of cyst is red size of a nickel   Patient refused to speak with nurse, just asking for an appt   Patient calling asking for labs to be ordered and to schedule appt for thyroid  follow up appt patient is out of medication.  Patient has no insurance.  Patient would like a call regarding cost of payment for appt. Patient stated okay to leave a detailed message. Patient phone [phone]

## 2024-09-09 ENCOUNTER — Ambulatory Visit: Payer: Self-pay | Admitting: Nurse Practitioner

## 2024-09-09 VITALS — BP 124/80 | HR 86 | Temp 97.9°F | Ht 64.0 in | Wt 285.6 lb

## 2024-09-09 DIAGNOSIS — G43101 Migraine with aura, not intractable, with status migrainosus: Secondary | ICD-10-CM

## 2024-09-09 DIAGNOSIS — D367 Benign neoplasm of other specified sites: Secondary | ICD-10-CM

## 2024-09-09 DIAGNOSIS — E063 Autoimmune thyroiditis: Secondary | ICD-10-CM

## 2024-09-09 DIAGNOSIS — R635 Abnormal weight gain: Secondary | ICD-10-CM

## 2024-09-09 LAB — CBC
HCT: 41.1 % (ref 36.0–46.0)
Hemoglobin: 13.4 g/dL (ref 12.0–15.0)
MCHC: 32.6 g/dL (ref 30.0–36.0)
MCV: 89.3 fl (ref 78.0–100.0)
Platelets: 224 K/uL (ref 150.0–400.0)
RBC: 4.6 Mil/uL (ref 3.87–5.11)
RDW: 14.6 % (ref 11.5–15.5)
WBC: 5.5 K/uL (ref 4.0–10.5)

## 2024-09-09 LAB — T3, FREE: T3, Free: 2.1 pg/mL — ABNORMAL LOW (ref 2.3–4.2)

## 2024-09-09 LAB — T4, FREE: Free T4: 0.22 ng/dL — ABNORMAL LOW (ref 0.60–1.60)

## 2024-09-09 LAB — CORTISOL: Cortisol, Plasma: 8.1 ug/dL

## 2024-09-09 LAB — TSH: TSH: 177.21 u[IU]/mL — ABNORMAL HIGH (ref 0.35–5.50)

## 2024-09-09 MED ORDER — CEPHALEXIN 500 MG PO CAPS
500.0000 mg | ORAL_CAPSULE | Freq: Three times a day (TID) | ORAL | 0 refills | Status: AC
Start: 2024-09-09 — End: ?

## 2024-09-09 NOTE — Progress Notes (Signed)
 Acute Office Visit  Subjective:     Patient ID: Sharon Benson, female    DOB: [birth year], 48 y.o.   MRN: [id number]  Chief Complaint  Patient presents with   Hospitalization Follow-up    Pt complains that of constant headaches from cyst. States of color, consistency and size has changed. Sept 2nd pt noticed R eye drooping and has had all day headaches with sensitivity to light since 08/13/24.      HPI  Discussed the use of AI scribe software for clinical note transcription with the patient, who gave verbal consent to proceed.  History of Present Illness Sharon Benson is a 48 year old female who presents with a persistent headache and changes in a cyst on her head.  She has a cyst on her head that has been present for an extended period but has recently changed in size, shape, and color. There is discharge from the cyst, and she experienced a significant headache, prompting a visit to the emergency department on August 23, 2024. A CT scan was not performed, and she declined IV medications due to concerns about driving.  Since August 13, 2024, she has experienced daily headaches, described as a sensation of 'someone's literally pressing with a hammer' on the right front side of her head, radiating down to pressure behind the eye. 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 She reports light sensitivity but not significant sound sensitivity. She experienced nausea around the time of her emergency department visit, but it resolved after two days. She has a remote history of migraines but notes that these current headaches differ in nature.  She has been off her thyroid  medication for a week due to cost concerns. Her previous regimen included alternating doses of 100 mcg and 112 mcg of levothyroxine , along with Armour thyroid . She reported that she did not  experience heart palpitations or lethargy while on this regimen.  She recently restarted Optiva,  which previously resulted in significant weight loss. She notes variability in her weight, which she attributes to her thyroid  condition.   Review of Systems  Constitutional:  Positive for chills and fever.  Respiratory:  Negative for sputum production.   Cardiovascular:  Negative for chest pain and palpitations.  Neurological:  Positive for headaches. Negative for weakness.        Objective:    BP 124/80   Pulse 86   Temp 97.9 F (36.6 C) (Oral)   Ht 5' 4 (1.626 m)   Wt 285 lb 9.6 oz (129.5 kg)   SpO2 96%   BMI 49.02 kg/m  BP Readings from Last 3 Encounters:  09/09/24 124/80  08/23/24 (!) 149/106  03/28/24 126/82   Wt Readings from Last 3 Encounters:  09/09/24 285 lb 9.6 oz (129.5 kg)  08/23/24 274 lb (124.3 kg)  03/28/24 276 lb 12.8 oz (125.6 kg)   SpO2 Readings from Last 3 Encounters:  09/09/24 96%  08/23/24 99%  03/28/24 98%      Physical Exam Vitals and nursing note reviewed.  Constitutional:      Appearance: Normal appearance.  Cardiovascular:     Rate and Rhythm: Normal rate and regular rhythm.     Heart sounds: Normal heart sounds.  Pulmonary:     Effort: Pulmonary effort is normal.     Breath sounds: Normal breath sounds.  Skin:    Findings: Lesion present.  Neurological:     Mental Status: She is alert.     No results found for any visits on 09/09/24.      Assessment & Plan:   Problem List Items Addressed This Visit       Cardiovascular and Mediastinum   Migraine headache     Endocrine   Hypothyroidism   Relevant Orders   TSH   T3, free   T4, free     Other   Dermoid cyst of head - Primary   Relevant Medications   cephALEXin (KEFLEX) 500 MG capsule   Other Relevant Orders   Ambulatory referral to Plastic Surgery   CBC   WOUND CULTURE   Other Visit Diagnoses       Weight gain       Relevant Orders   Cortisol       Assessment and Plan Assessment & Plan Scalp cyst with recent changes and possible infection Scalp cyst with changes in size, shape, and color, possibly infected. Unlikely to affect skull bone. - Refer to plastic surgery for evaluation and possible removal. - Order wound culture for infection assessment. - Prescribe antibiotics for potential infection. - Order CBC for infection or inflammation signs.   right frontal headache right frontal headache with light sensitivity and nausea. Managed with ibuprofen , but risk of rebound headaches. Possible relation to cyst, resolution expected post-cyst management. - Discuss rebound headaches due to medication overuse. - Consider referral if headaches persist post-cyst management.  Hypothyroidism Hypothyroidism with recent medication discontinuation. Managed with levothyroxine  and Armour thyroid . Concerns about weight impact on thyroid  tests. - Order thyroid  function tests. - Continue current thyroid  medication regimen post-lab results.  Obesity Obesity managed with Optiva. Previous weight loss success, but sustainability concerns. Recent weight changes noted. - Continue Optiva for weight management. - Monitor weight changes and adjust treatment as necessary.  Follow-Up Follow-up plans discussed for ongoing management. - Schedule follow-up after blood work results. - Coordinate referral to plastic surgery for cyst management.  Meds ordered this encounter  Medications   cephALEXin (KEFLEX) 500 MG capsule    Sig: Take 1 capsule (500 mg total) by mouth 3 (three) times daily.    Dispense:  21 capsule    Refill:  0    Supervising Provider:   RANDEEN HARDY A [[id number]]    Return if symptoms worsen or fail to improve.  Adina Crandall, NP

## 2024-09-10 ENCOUNTER — Ambulatory Visit: Payer: Self-pay | Admitting: Nurse Practitioner

## 2024-09-10 DIAGNOSIS — E039 Hypothyroidism, unspecified: Secondary | ICD-10-CM

## 2024-09-12 LAB — WOUND CULTURE
MICRO NUMBER:: 17029802
SPECIMEN QUALITY:: ADEQUATE

## 2024-09-18 ENCOUNTER — Ambulatory Visit (INDEPENDENT_AMBULATORY_CARE_PROVIDER_SITE_OTHER): Payer: Self-pay | Admitting: Plastic Surgery

## 2024-09-18 ENCOUNTER — Encounter: Payer: Self-pay | Admitting: Plastic Surgery

## 2024-09-18 VITALS — BP 117/78 | HR 97 | Temp 98.5°F | Ht 64.0 in | Wt 281.0 lb

## 2024-09-18 DIAGNOSIS — R220 Localized swelling, mass and lump, head: Secondary | ICD-10-CM

## 2024-09-18 DIAGNOSIS — M898X8 Other specified disorders of bone, other site: Secondary | ICD-10-CM | POA: Insufficient documentation

## 2024-09-18 NOTE — Progress Notes (Signed)
 Patient ID: Sharon Benson, female    DOB: [birth year], 48 y.o.   MRN: [id number]   Chief Complaint  Patient presents with   Consult    Consult for Dermoid cyst of head    The patient is a 48 year old female here for evaluation of her forehead.  She is 5 feet 4 inches tall and weighs 281 pounds.  She has multiple medical conditions as listed below.  She has had surgery before without complications.  The patient has noticed for about 10 years.  It has drained in the past and was about a centimeter in size.  She has noticed in the past 6 months the size has gotten significantly larger.  It is tender to touch it is slightly movable.  It is approximately 2 x 3 cm in size.  The skin on top is fairly tight.  It looks like the skin might be send as well.  She denies any other associated symptoms.    Review of Systems  Constitutional: Negative.   Eyes: Negative.   Respiratory: Negative.    Cardiovascular: Negative.   Gastrointestinal: Negative.   Endocrine: Negative.   Genitourinary: Negative.   Musculoskeletal: Negative.   Skin:  Positive for color change.    Past Medical History:  Diagnosis Date   Anxiety    panic attack - not recent 2001ish   Arthritis    Asthma    Cancer (HCC)    thyroid  cancer 2016   Dizziness    Endometriosis    Fibroid    GERD (gastroesophageal reflux disease)    Headache(784.0)    History of multiple miscarriages    Hypothyroidism    Insomnia    Palpitations    PCOS (polycystic ovarian syndrome)    PONV (postoperative nausea and vomiting)    last 2- no vomiting-  Seizures- not with last 2.   Seizures (HCC)    last seizure 04/2017   SVD (spontaneous vaginal delivery)    x 1   Vertigo    Vitamin D  deficiency     Past Surgical History:  Procedure Laterality Date   BREAST REDUCTION SURGERY     BREAST SURGERY  1997   COLONOSCOPY     DILATION AND EVACUATION N/A 11/25/2013   Procedure: DILATATION AND EVACUATION;  Surgeon: Lynwood KANDICE Solomons, MD;   Location: [facility];  Service: Gynecology;  Laterality: N/A;   DILATION AND EVACUATION N/A 11/30/2013   Procedure: DILATATION AND EVACUATION;  Surgeon: Norleen LULLA Server, MD;  Location: [facility];  Service: Gynecology;  Laterality: N/A;   DILATION AND EVACUATION N/A 05/02/2014   Procedure: DILATATION AND EVACUATION;  Surgeon: Rome Rigg, MD;  Location: [facility];  Service: Gynecology;  Laterality: N/A;   GANGLION CYST EXCISION  1999   right hand   mirena     REDUCTION MAMMAPLASTY Bilateral    THYROIDECTOMY Bilateral 01/02/2015   Procedure: TOTAL THYROIDECTOMY;  Surgeon: Norleen Notice, MD;  Location: [facility] OR;  Service: ENT;  Laterality: Bilateral;   UPPER GI ENDOSCOPY        Current Outpatient Medications:    albuterol  (PROVENTIL  HFA;VENTOLIN  HFA) 108 (90 Base) MCG/ACT inhaler, Inhale 2 puffs into the lungs every 4 (four) hours as needed for wheezing or shortness of breath., Disp: 1 Inhaler, Rfl: 3   budesonide-formoterol (SYMBICORT) 80-4.5 MCG/ACT inhaler, 2 puffs Inhalation twice a day for 30 days, Disp: , Rfl:    cephALEXin (KEFLEX) 500 MG capsule, Take 1 capsule (500 mg total) by mouth  3 (three) times daily., Disp: 21 capsule, Rfl: 0   EPINEPHrine  0.3 mg/0.3 mL IJ SOAJ injection, Inject 0.3 mg into the muscle as needed for anaphylaxis., Disp: , Rfl:    fluticasone  (FLONASE ) 50 MCG/ACT nasal spray, Place 2 sprays into both nostrils daily., Disp: 16 g, Rfl: 0   ibuprofen  (ADVIL ) 600 MG tablet, Take 1 tablet (600 mg total) by mouth every 8 (eight) hours as needed., Disp: 30 tablet, Rfl: 0   ibuprofen  (ADVIL ) 800 MG tablet, Take 1 tablet (800 mg total) by mouth every 8 (eight) hours as needed., Disp: 30 tablet, Rfl: 0   levonorgestrel (MIRENA) 20 MCG/24HR IUD, 1 each by Intrauterine route continuous., Disp: , Rfl:    levothyroxine  (SYNTHROID ) 100 MCG tablet, Every other day. Opposite day of the 112mcg dose, Disp: 90 tablet, Rfl: 0   levothyroxine  (SYNTHROID ) 112 MCG tablet, Every other day. Opposite day  of 100mcg dose, Disp: 90 tablet, Rfl: 0   thyroid  (ARMOUR) 60 MG tablet, Take 2 tablets (120 mg total) by mouth daily before breakfast., Disp: 180 tablet, Rfl: 1   Objective:   Vitals:   09/18/24 1119  BP: 117/78  Pulse: 97  Temp: 98.5 F (36.9 C)  SpO2: 98%    Physical Exam Constitutional:      Appearance: Normal appearance.  HENT:     Head: Atraumatic.   Cardiovascular:     Rate and Rhythm: Normal rate.     Pulses: Normal pulses.  Musculoskeletal:        General: Swelling and tenderness present.  Skin:    General: Skin is warm.     Capillary Refill: Capillary refill takes less than 2 seconds.     Findings: Bruising and lesion present.  Neurological:     Mental Status: She is alert.  Psychiatric:        Mood and Affect: Mood normal.        Behavior: Behavior normal.        Thought Content: Thought content normal.        Judgment: Judgment normal.     Assessment & Plan:  Skull mass  Recommend excision of the mass in the operating room.  We will definitely send it to pathology for evaluation.  Pictures were obtained of the patient and placed in the chart with the patient's or guardian's permission.   Estefana RAMAN Ajit Errico, DO

## 2024-10-07 ENCOUNTER — Telehealth: Payer: Self-pay | Admitting: Plastic Surgery

## 2024-10-07 NOTE — Telephone Encounter (Signed)
 Please schd in office.... patient will have $500 charge due at check in ... will also get bill from pathology   Procedure: Excision of mass scalp/forehead  Time/length: 45 minutes with Dr JONETTA Fortis and lvmail for the pt to call the office

## 2024-10-16 ENCOUNTER — Ambulatory Visit: Payer: Self-pay | Admitting: Nurse Practitioner

## 2024-10-16 NOTE — Telephone Encounter (Signed)
 Appt scheduled

## 2024-10-16 NOTE — Telephone Encounter (Signed)
 FYI Only or Action Required?: Action required by provider: update on patient condition.  Patient was last seen in primary care on 09/09/2024 by Wendee Lynwood HERO, NP.  Called Nurse Triage reporting Thyroid  Problem.  Symptoms began a week ago.  Triage Disposition: See PCP When Office is Open (Within 3 Days)  Patient/caregiver understands and will follow disposition?: Yes        Copied from CRM [id number]. Topic: Clinical - Red Word Triage >> Oct 16, 2024  2:52 PM Rosina BIRCH wrote: Red Word that prompted transfer to Nurse Triage: patient called wanting to know when she need to come in for her next blood work. Patient stated she lost 31lbs and she is sluggish and she feel like her heart is racing  Highest heart rate was 128 Reason for Disposition  [1] MILD weakness (e.g., does not interfere with ability to work, go to school, normal activities) AND [2] persists > 1 week  Answer Assessment - Initial Assessment Questions This RN scheduled pt for first available appt with PCP on Fri,11/7. This RN will send a high priority message to clinic in case they are able to get pt to come in earlier for needed blood work.  This RN educated pt on new/worsening symptoms and when to call back; pt verbalized understanding and agrees to plan.  Pt without thyroid ; taking Levothyroxine  and Armour 60 mg. Reports lethargy x1 week and intermittent heart racing (last episode 25 mins ago), stating this usually indicates medication imbalance. Bloodwork last done 1 month ago. Pt reports 31-lb weight loss over past month.  Protocols used: Weakness (Generalized) and Fatigue-A-AH

## 2024-10-18 ENCOUNTER — Ambulatory Visit: Payer: Self-pay | Admitting: Nurse Practitioner

## 2024-10-28 ENCOUNTER — Other Ambulatory Visit: Payer: Self-pay

## 2024-11-25 ENCOUNTER — Other Ambulatory Visit: Payer: Self-pay | Admitting: Nurse Practitioner

## 2024-11-25 NOTE — Telephone Encounter (Signed)
 Copied from CRM #[id number]. Topic: Clinical - Medication Refill >> Nov 25, 2024  2:13 PM Kendralyn S wrote: Medication: albuterol  (PROVENTIL  HFA;VENTOLIN  HFA) 108 (90 Base) MCG/ACT inhaler budesonide-formoterol (SYMBICORT) 80-4.5 MCG/ACT inhaler  And a nebulizer  Has the patient contacted their pharmacy? Yes (Agent: If no, request that the patient contact the pharmacy for the refill. If patient does not wish to contact the pharmacy document the reason why and proceed with request.) (Agent: If yes, when and what did the pharmacy advise?)  This is the patient's preferred pharmacy:  [facility] [phone] [facility] [place] - [address] [place] [address] Phone: [phone] Fax: [phone]  Is this the correct pharmacy for this prescription? Yes If no, delete pharmacy and type the correct one.   Has the prescription been filled recently? No  Is the patient out of the medication? Yes  Has the patient been seen for an appointment in the last year OR does the patient have an upcoming appointment? Yes  Can we respond through MyChart? Yes  Agent: Please be advised that Rx refills may take up to 3 business days. We ask that you follow-up with your pharmacy.

## 2024-11-26 NOTE — Telephone Encounter (Signed)
 Can we call and verify the use of the symbicort. Is she using it daily or just as needed

## 2024-12-04 NOTE — Telephone Encounter (Signed)
 Pt states that she uses one inhaler daily but one as needed but is not sure which one she uses as prn and which one is used daily.

## 2024-12-06 MED ORDER — ALBUTEROL SULFATE HFA 108 (90 BASE) MCG/ACT IN AERS: 2.0000 | INHALATION_SPRAY | RESPIRATORY_TRACT | 3 refills | Status: AC | PRN

## 2024-12-06 MED ORDER — BUDESONIDE-FORMOTEROL FUMARATE 80-4.5 MCG/ACT IN AERO: 2.0000 | INHALATION_SPRAY | Freq: Two times a day (BID) | RESPIRATORY_TRACT | 3 refills | Status: AC

## 2025-01-28 ENCOUNTER — Ambulatory Visit: Payer: Self-pay | Admitting: Dermatology
